# Patient Record
Sex: Female | Born: 1950 | Race: Black or African American | Hispanic: No | Smoking: Former smoker
Health system: Southern US, Community
[De-identification: ages and names within clinical notes are randomized; demographics above are authoritative.]

## PROBLEM LIST (undated history)

## (undated) DIAGNOSIS — D3500 Benign neoplasm of unspecified adrenal gland: Secondary | ICD-10-CM

## (undated) DIAGNOSIS — G4730 Sleep apnea, unspecified: Secondary | ICD-10-CM

## (undated) DIAGNOSIS — I10 Essential (primary) hypertension: Secondary | ICD-10-CM

## (undated) DIAGNOSIS — Z889 Allergy status to unspecified drugs, medicaments and biological substances status: Secondary | ICD-10-CM

## (undated) DIAGNOSIS — K219 Gastro-esophageal reflux disease without esophagitis: Secondary | ICD-10-CM

## (undated) DIAGNOSIS — R109 Unspecified abdominal pain: Secondary | ICD-10-CM

## (undated) DIAGNOSIS — D126 Benign neoplasm of colon, unspecified: Secondary | ICD-10-CM

## (undated) DIAGNOSIS — K449 Diaphragmatic hernia without obstruction or gangrene: Secondary | ICD-10-CM

## (undated) DIAGNOSIS — M1990 Unspecified osteoarthritis, unspecified site: Secondary | ICD-10-CM

## (undated) DIAGNOSIS — G8929 Other chronic pain: Secondary | ICD-10-CM

## (undated) HISTORY — PX: COLONOSCOPY: SHX174

## (undated) HISTORY — PX: ABDOMINAL HYSTERECTOMY: SHX81

## (undated) HISTORY — PX: CHOLECYSTECTOMY: SHX55

## (undated) HISTORY — PX: JOINT REPLACEMENT: SHX530

## (undated) HISTORY — PX: FRACTURE SURGERY: SHX138

---

## 1998-06-09 ENCOUNTER — Encounter: Admission: RE | Admit: 1998-06-09 | Discharge: 1998-06-09 | Payer: Self-pay | Admitting: *Deleted

## 2004-10-12 ENCOUNTER — Emergency Department: Payer: Self-pay | Admitting: Emergency Medicine

## 2004-12-03 ENCOUNTER — Ambulatory Visit: Payer: Self-pay | Admitting: Family Medicine

## 2004-12-14 ENCOUNTER — Ambulatory Visit: Payer: Self-pay | Admitting: Family Medicine

## 2006-01-23 ENCOUNTER — Emergency Department: Payer: Self-pay | Admitting: Emergency Medicine

## 2006-04-02 ENCOUNTER — Emergency Department: Payer: Self-pay | Admitting: Emergency Medicine

## 2006-07-06 ENCOUNTER — Ambulatory Visit: Payer: Self-pay | Admitting: Family Medicine

## 2007-01-12 ENCOUNTER — Ambulatory Visit: Payer: Self-pay | Admitting: Family Medicine

## 2007-02-21 ENCOUNTER — Emergency Department: Payer: Self-pay | Admitting: Emergency Medicine

## 2007-03-16 ENCOUNTER — Other Ambulatory Visit: Payer: Self-pay

## 2007-03-16 ENCOUNTER — Emergency Department: Payer: Self-pay | Admitting: General Practice

## 2007-03-24 ENCOUNTER — Ambulatory Visit: Payer: Self-pay | Admitting: Unknown Physician Specialty

## 2007-06-02 ENCOUNTER — Ambulatory Visit: Payer: Self-pay | Admitting: Unknown Physician Specialty

## 2007-09-08 ENCOUNTER — Ambulatory Visit: Payer: Self-pay | Admitting: Family Medicine

## 2007-09-14 ENCOUNTER — Ambulatory Visit: Payer: Self-pay | Admitting: Family Medicine

## 2007-09-19 ENCOUNTER — Ambulatory Visit: Payer: Self-pay | Admitting: Family Medicine

## 2008-02-08 ENCOUNTER — Ambulatory Visit: Payer: Self-pay | Admitting: Unknown Physician Specialty

## 2008-03-21 ENCOUNTER — Ambulatory Visit: Payer: Self-pay | Admitting: Unknown Physician Specialty

## 2008-06-19 ENCOUNTER — Ambulatory Visit: Payer: Self-pay | Admitting: Unknown Physician Specialty

## 2008-10-07 ENCOUNTER — Ambulatory Visit: Payer: Self-pay | Admitting: Unknown Physician Specialty

## 2009-04-16 ENCOUNTER — Ambulatory Visit: Payer: Self-pay | Admitting: Unknown Physician Specialty

## 2009-10-27 ENCOUNTER — Ambulatory Visit: Payer: Self-pay | Admitting: General Practice

## 2009-11-12 ENCOUNTER — Inpatient Hospital Stay: Payer: Self-pay | Admitting: General Practice

## 2010-05-19 ENCOUNTER — Ambulatory Visit: Payer: Self-pay | Admitting: Family Medicine

## 2011-06-08 ENCOUNTER — Ambulatory Visit: Payer: Self-pay | Admitting: Family Medicine

## 2012-06-08 ENCOUNTER — Ambulatory Visit: Payer: Self-pay | Admitting: Family Medicine

## 2013-05-18 ENCOUNTER — Ambulatory Visit: Payer: Self-pay | Admitting: Unknown Physician Specialty

## 2013-06-12 ENCOUNTER — Ambulatory Visit: Payer: Self-pay | Admitting: Family Medicine

## 2013-07-21 ENCOUNTER — Emergency Department: Payer: Self-pay | Admitting: Emergency Medicine

## 2013-07-21 LAB — COMPREHENSIVE METABOLIC PANEL
Albumin: 4 g/dL (ref 3.4–5.0)
Alkaline Phosphatase: 61 U/L (ref 50–136)
BUN: 17 mg/dL (ref 7–18)
Bilirubin,Total: 0.2 mg/dL (ref 0.2–1.0)
Chloride: 103 mmol/L (ref 98–107)
EGFR (African American): 45 — ABNORMAL LOW
EGFR (Non-African Amer.): 39 — ABNORMAL LOW
SGOT(AST): 20 U/L (ref 15–37)
SGPT (ALT): 25 U/L (ref 12–78)
Total Protein: 8 g/dL (ref 6.4–8.2)

## 2013-07-21 LAB — URINALYSIS, COMPLETE
Bacteria: NONE SEEN
Glucose,UR: NEGATIVE mg/dL (ref 0–75)
Leukocyte Esterase: NEGATIVE
Nitrite: NEGATIVE
Ph: 6 (ref 4.5–8.0)
Protein: NEGATIVE
Squamous Epithelial: 1

## 2013-07-21 LAB — CBC
HGB: 11.4 g/dL — ABNORMAL LOW (ref 12.0–16.0)
MCH: 24.6 pg — ABNORMAL LOW (ref 26.0–34.0)
Platelet: 342 10*3/uL (ref 150–440)
RDW: 15.6 % — ABNORMAL HIGH (ref 11.5–14.5)

## 2013-07-21 LAB — TROPONIN I: Troponin-I: <0.02 ng/mL

## 2013-08-06 ENCOUNTER — Emergency Department: Payer: Self-pay | Admitting: Emergency Medicine

## 2013-08-06 LAB — TROPONIN I: Troponin-I: <0.02 ng/mL

## 2013-08-06 LAB — COMPREHENSIVE METABOLIC PANEL
Albumin: 3.7 g/dL (ref 3.4–5.0)
Alkaline Phosphatase: 52 U/L (ref 50–136)
Anion Gap: 6 — ABNORMAL LOW (ref 7–16)
Calcium, Total: 9.5 mg/dL (ref 8.5–10.1)
Co2: 26 mmol/L (ref 21–32)
Creatinine: 0.96 mg/dL (ref 0.60–1.30)
EGFR (African American): >60
EGFR (Non-African Amer.): >60
Osmolality: 272 (ref 275–301)
Potassium: 3.1 mmol/L — ABNORMAL LOW (ref 3.5–5.1)

## 2013-08-06 LAB — CBC WITH DIFFERENTIAL/PLATELET
Basophil #: 0.1 10*3/uL (ref 0.0–0.1)
Basophil %: 0.9 %
Lymphocyte #: 2.6 10*3/uL (ref 1.0–3.6)
Lymphocyte %: 31.2 %
Monocyte #: 0.5 x10 3/mm (ref 0.2–0.9)
Monocyte %: 6.1 %
Neutrophil %: 60.8 %
Platelet: 307 10*3/uL (ref 150–440)
RDW: 15.6 % — ABNORMAL HIGH (ref 11.5–14.5)

## 2013-08-06 LAB — LIPASE, BLOOD: Lipase: 56 U/L — ABNORMAL LOW (ref 73–393)

## 2013-08-10 ENCOUNTER — Ambulatory Visit: Payer: Self-pay | Admitting: Unknown Physician Specialty

## 2013-11-06 ENCOUNTER — Ambulatory Visit: Payer: Self-pay | Admitting: Neurology

## 2013-12-11 ENCOUNTER — Ambulatory Visit: Payer: Self-pay | Admitting: Neurology

## 2014-03-20 ENCOUNTER — Ambulatory Visit: Payer: Self-pay

## 2014-05-08 ENCOUNTER — Emergency Department: Payer: Self-pay | Admitting: Emergency Medicine

## 2014-05-08 LAB — CBC WITH DIFFERENTIAL/PLATELET
Basophil #: 0.1 10*3/uL (ref 0.0–0.1)
Basophil %: 1.3 %
EOS ABS: 0.1 10*3/uL (ref 0.0–0.7)
Eosinophil %: 1.6 %
HCT: 35.5 % (ref 35.0–47.0)
HGB: 10.8 g/dL — AB (ref 12.0–16.0)
LYMPHS PCT: 37.4 %
Lymphocyte #: 3.4 10*3/uL (ref 1.0–3.6)
MCH: 23.2 pg — ABNORMAL LOW (ref 26.0–34.0)
MCHC: 30.5 g/dL — ABNORMAL LOW (ref 32.0–36.0)
MCV: 76 fL — ABNORMAL LOW (ref 80–100)
MONOS PCT: 5.9 %
Monocyte #: 0.5 x10 3/mm (ref 0.2–0.9)
NEUTROS ABS: 4.9 10*3/uL (ref 1.4–6.5)
Neutrophil %: 53.8 %
Platelet: 308 10*3/uL (ref 150–440)
RBC: 4.68 10*6/uL (ref 3.80–5.20)
RDW: 16.3 % — ABNORMAL HIGH (ref 11.5–14.5)
WBC: 9.1 10*3/uL (ref 3.6–11.0)

## 2014-05-08 LAB — COMPREHENSIVE METABOLIC PANEL
Albumin: 3.7 g/dL (ref 3.4–5.0)
Alkaline Phosphatase: 57 U/L
Anion Gap: 9 (ref 7–16)
BUN: 16 mg/dL (ref 7–18)
Bilirubin,Total: 0.1 mg/dL — ABNORMAL LOW (ref 0.2–1.0)
Calcium, Total: 8.8 mg/dL (ref 8.5–10.1)
Chloride: 106 mmol/L (ref 98–107)
Co2: 23 mmol/L (ref 21–32)
Creatinine: 1.25 mg/dL (ref 0.60–1.30)
EGFR (African American): 54 — ABNORMAL LOW
EGFR (Non-African Amer.): 46 — ABNORMAL LOW
Glucose: 97 mg/dL (ref 65–99)
Osmolality: 277 (ref 275–301)
Potassium: 3.8 mmol/L (ref 3.5–5.1)
SGOT(AST): 12 U/L — ABNORMAL LOW (ref 15–37)
SGPT (ALT): 20 U/L
Sodium: 138 mmol/L (ref 136–145)
TOTAL PROTEIN: 7.6 g/dL (ref 6.4–8.2)

## 2014-05-08 LAB — LIPASE, BLOOD: LIPASE: 81 U/L (ref 73–393)

## 2014-05-08 LAB — URINALYSIS, COMPLETE
BACTERIA: NONE SEEN
Bilirubin,UR: NEGATIVE
Blood: NEGATIVE
GLUCOSE, UR: NEGATIVE mg/dL (ref 0–75)
Ketone: NEGATIVE
LEUKOCYTE ESTERASE: NEGATIVE
Nitrite: NEGATIVE
Ph: 5 (ref 4.5–8.0)
Protein: NEGATIVE
RBC,UR: NONE SEEN /HPF (ref 0–5)
Specific Gravity: 1.01 (ref 1.003–1.030)
Squamous Epithelial: 1
WBC UR: NONE SEEN /HPF (ref 0–5)

## 2014-05-08 LAB — TROPONIN I: Troponin-I: <0.02 ng/mL

## 2014-08-12 ENCOUNTER — Ambulatory Visit: Payer: Self-pay | Admitting: Family Medicine

## 2015-10-09 ENCOUNTER — Other Ambulatory Visit: Payer: Self-pay | Admitting: Family Medicine

## 2015-10-09 DIAGNOSIS — Z1231 Encounter for screening mammogram for malignant neoplasm of breast: Secondary | ICD-10-CM

## 2015-10-24 ENCOUNTER — Ambulatory Visit
Admission: RE | Admit: 2015-10-24 | Discharge: 2015-10-24 | Disposition: A | Discharge status: Home / Self Care | Payer: Medicare Other | Source: Ambulatory Visit | Attending: Family Medicine | Admitting: Family Medicine

## 2015-10-24 DIAGNOSIS — Z1231 Encounter for screening mammogram for malignant neoplasm of breast: Secondary | ICD-10-CM | POA: Diagnosis present

## 2015-11-27 ENCOUNTER — Other Ambulatory Visit: Payer: Self-pay | Admitting: Adult Health

## 2015-11-27 DIAGNOSIS — R7989 Other specified abnormal findings of blood chemistry: Secondary | ICD-10-CM

## 2015-11-27 DIAGNOSIS — IMO0002 Reserved for concepts with insufficient information to code with codable children: Secondary | ICD-10-CM

## 2015-11-28 ENCOUNTER — Ambulatory Visit: Payer: Medicare Other

## 2015-12-01 ENCOUNTER — Ambulatory Visit
Admission: RE | Admit: 2015-12-01 | Discharge: 2015-12-01 | Disposition: A | Discharge status: Home / Self Care | Payer: Medicare Other | Source: Ambulatory Visit | Attending: Adult Health | Admitting: Adult Health

## 2015-12-01 DIAGNOSIS — R7989 Other specified abnormal findings of blood chemistry: Secondary | ICD-10-CM | POA: Insufficient documentation

## 2016-08-08 ENCOUNTER — Emergency Department
Admission: EM | Admit: 2016-08-08 | Discharge: 2016-08-08 | Disposition: A | Discharge status: Home / Self Care | Payer: Medicare Other | Attending: Emergency Medicine | Admitting: Emergency Medicine

## 2016-08-08 ENCOUNTER — Encounter: Payer: Self-pay | Admitting: Emergency Medicine

## 2016-08-08 ENCOUNTER — Emergency Department: Payer: Medicare Other

## 2016-08-08 DIAGNOSIS — M5442 Lumbago with sciatica, left side: Secondary | ICD-10-CM | POA: Insufficient documentation

## 2016-08-08 DIAGNOSIS — R42 Dizziness and giddiness: Secondary | ICD-10-CM | POA: Insufficient documentation

## 2016-08-08 DIAGNOSIS — M5432 Sciatica, left side: Secondary | ICD-10-CM

## 2016-08-08 DIAGNOSIS — I10 Essential (primary) hypertension: Secondary | ICD-10-CM | POA: Diagnosis not present

## 2016-08-08 DIAGNOSIS — M545 Low back pain: Secondary | ICD-10-CM | POA: Diagnosis present

## 2016-08-08 HISTORY — DX: Gastro-esophageal reflux disease without esophagitis: K21.9

## 2016-08-08 HISTORY — DX: Essential (primary) hypertension: I10

## 2016-08-08 HISTORY — DX: Diaphragmatic hernia without obstruction or gangrene: K44.9

## 2016-08-08 LAB — URINALYSIS COMPLETE WITH MICROSCOPIC (ARMC ONLY)
BACTERIA UA: NONE SEEN
Bilirubin Urine: NEGATIVE
Glucose, UA: NEGATIVE mg/dL
Hgb urine dipstick: NEGATIVE
KETONES UR: NEGATIVE mg/dL
Leukocytes, UA: NEGATIVE
NITRITE: NEGATIVE
PH: 7 (ref 5.0–8.0)
PROTEIN: NEGATIVE mg/dL
RBC / HPF: NONE SEEN RBC/hpf (ref 0–5)
SPECIFIC GRAVITY, URINE: 1.011 (ref 1.005–1.030)

## 2016-08-08 LAB — BASIC METABOLIC PANEL
ANION GAP: 10 (ref 5–15)
BUN: 17 mg/dL (ref 6–20)
CALCIUM: 9.6 mg/dL (ref 8.9–10.3)
CO2: 25 mmol/L (ref 22–32)
Chloride: 104 mmol/L (ref 101–111)
Creatinine, Ser: 1.09 mg/dL — ABNORMAL HIGH (ref 0.44–1.00)
GFR calc non Af Amer: 53 mL/min — ABNORMAL LOW (ref >60)
GLUCOSE: 118 mg/dL — AB (ref 65–99)
POTASSIUM: 3.2 mmol/L — AB (ref 3.5–5.1)
Sodium: 139 mmol/L (ref 135–145)

## 2016-08-08 LAB — CBC
HEMATOCRIT: 29.1 % — AB (ref 35.0–47.0)
HEMOGLOBIN: 9.2 g/dL — AB (ref 12.0–16.0)
MCH: 20.6 pg — AB (ref 26.0–34.0)
MCHC: 31.5 g/dL — AB (ref 32.0–36.0)
MCV: 65.5 fL — ABNORMAL LOW (ref 80.0–100.0)
Platelets: 406 10*3/uL (ref 150–440)
RBC: 4.44 MIL/uL (ref 3.80–5.20)
RDW: 18.8 % — ABNORMAL HIGH (ref 11.5–14.5)
WBC: 9.8 10*3/uL (ref 3.6–11.0)

## 2016-08-08 NOTE — ED Notes (Signed)
Lying HR 61, BP 120/67. Sitting HR 65, BP 140/70 Standing HR 65, BP 134/67

## 2016-08-08 NOTE — ED Provider Notes (Signed)
[facility] Emergency Department Provider Note  ____________________________________________   I have reviewed the triage vital signs and the nursing notes.   HISTORY  Chief Complaint Weakness and Dizziness    HPI Teresa Sparks is a 65 y.o. female here with multiple different symptoms. The first is that she has a history of sciatica for "years". Known back problems, states that she has chronic back pain. She has no change in her back pain, that she does have occasional sciatic symptoms which in call shooting pains down her left leg, and tingling in her left leg. Yesterday and today she had tingling in her left leg only. She denies any focal weakness. Tingling got better soon as she sat down. Lasted just as long as he would last a few foot was asleep. It happened again this morning. She has had similar symptoms with this in the past. Patient denies any neurologic deficit. She does not wish a CT scan. Patient states at this time she has a tiny area of tingling still in the left anterior thigh which continues on despite the resolution of the rest of the symptoms. Nonetheless everything else is back to normal. She denies any difficulty speaking or talking she denies any chest pain shortness of breath she denies any focal weakness. She denies incontinence of bowel or bladder she denies any recent fall or trauma. Patient also states that they have increased her blood pressure, her blood pressure normally runs in the 180s to 190s, here, today, it is been lower. She states that sometimes when she stands up she feels lightheaded. His only when she stands up. She does not have any other symptoms of that variety. Patient states that she does not have a headache or change in vision.     Past Medical History:  Diagnosis Date  . GERD (gastroesophageal reflux disease)   . Hiatal hernia   . Hypertension     There are no active problems to display for this patient.   Past  Surgical History:  Procedure Laterality Date  . ABDOMINAL HYSTERECTOMY    . CHOLECYSTECTOMY    . JOINT REPLACEMENT      Prior to Admission medications   Not on File    Allergies Ivp dye [iodinated diagnostic agents] and Sulfa antibiotics  No family history on file.  Social History Social History  Substance Use Topics  . Smoking status: Never Smoker  . Smokeless tobacco: Never Used  . Alcohol use No    Review of Systems }Constitutional: No fever/chills Eyes: No visual changes. ENT: No sore throat. No stiff neck no neck pain Cardiovascular: Denies chest pain. Respiratory: Denies shortness of breath. Gastrointestinal:   no vomiting.  No diarrhea.  No constipation. Genitourinary: Negative for dysuria. Musculoskeletal: Negative lower extremity swelling Skin: Negative for rash. Neurological: Negative for severe headaches, focal weakness or  10-point ROS otherwise negative.  ____________________________________________   PHYSICAL EXAM:  VITAL SIGNS: ED Triage Vitals  Enc Vitals Group     BP 08/08/16 0928 (!) 160/60     Pulse Rate 08/08/16 0928 77     Resp 08/08/16 0928 15     Temp 08/08/16 0928 98.1 F (36.7 C)     Temp Source 08/08/16 0928 Oral     SpO2 08/08/16 0928 100 %     Weight 08/08/16 0929 238 lb (108 kg)     Height 08/08/16 0929 5\' 9"  (1.753 m)     Head Circumference --      Peak Flow --  Pain Score 08/08/16 0928 10     Pain Loc --      Pain Edu? --      Excl. in [place]? --     Constitutional: Alert and oriented. Well appearing and in no acute distress. Eyes: Conjunctivae are normal. PERRL. EOMI. Head: Atraumatic. Nose: No congestion/rhinnorhea. Mouth/Throat: Mucous membranes are moist.  Oropharynx non-erythematous. Neck: No stridor.   Nontender with no meningismus Cardiovascular: Normal rate, regular rhythm. Grossly normal heart sounds.  Good peripheral circulation. Respiratory: Normal respiratory effort.  No retractions. Lungs  CTAB. Abdominal: Soft and nontender. No distention. No guarding no rebound Back:  A with low back tenderness to palpation in the upper buttock which reproduces her back pain and also since a tingling sensation down her leg. There is no shingles lesions, no midline tenderness.  there is no midline tenderness there are no lesions noted. there is no CVA tenderness Musculoskeletal: No lower extremity tenderness, no upper extremity tenderness. No joint effusions, no DVT signs strong distal pulses no edema Neurologic:  NCranial nerves II through XII are grossly intact 5 out of 5 strength bilateral upper and lower extremity. Finger to nose within normal limits heel to shin within normal limits, speech is normal with no word finding difficulty or dysarthria, reflexes symmetric, pupils are equally round and reactive to light, there is no pronator drift, sensation is normal, vision is intact to confrontation, gait is deferred, there is no nystagmus, normal neurologic exam, patient does have a positive straight leg raise on the left by lift her leg up it causes her to have pain shoot down her leg. Skin:  Skin is warm, dry and intact. No rash noted. Psychiatric: Mood and affect are normal. Speech and behavior are normal.  ____________________________________________   LABS (all labs ordered are listed, but only abnormal results are displayed)  Labs Reviewed  BASIC METABOLIC PANEL - Abnormal; Notable for the following:       Result Value   Potassium 3.2 (*)    Glucose, Bld 118 (*)    Creatinine, Ser 1.09 (*)    GFR calc non Af Amer 53 (*)    All other components within normal limits  CBC - Abnormal; Notable for the following:    Hemoglobin 9.2 (*)    HCT 29.1 (*)    MCV 65.5 (*)    MCH 20.6 (*)    MCHC 31.5 (*)    RDW 18.8 (*)    All other components within normal limits  URINALYSIS COMPLETEWITH MICROSCOPIC ([facility] ONLY)   ____________________________________________  EKG  I personally  interpreted any EKGs ordered by me or triage Normal sinus rhythm at 67 beats minute no acute ST elevation or acute ST depression normal EKG sent from nonspecific ST flattening ____________________________________________  RADIOLOGY  I reviewed any imaging ordered by me or triage that were performed during my shift and, if possible, patient and/or family made aware of any abnormal findings. ____________________________________________   PROCEDURES  Procedure(s) performed: None  Procedures  Critical Care performed: None  ____________________________________________   INITIAL IMPRESSION / ASSESSMENT AND PLAN / ED COURSE  Pertinent labs & imaging results that were available during my care of the patient were reviewed by me and considered in my medical decision making (see chart for details).  Patient has 2 different symptoms here today.  The first is what appears to be to be a sciatic. It is reproducible, is elicitable straight leg raise nonetheless she is neurologic intact there is no evidence of cauda  equina syndrome, and she has had this multiple times before. We can certainly treat this with pain medication. The second is that after a new blood pressure medication she feels somewhat lightheaded when she stands up too quickly. She's been told that she must start to stand up slowly and she is working on that. I did offer a CT scan because of the focality of her symptoms even vanishingly small suspicion of CVA and she would prefer not to have one. I do not think this is unreasonable. We have checked orthostatics here. She has a reassuring profile. I don't think she is profoundly orthostatic. Blood work shows no evidence of anemia. We are waiting for a urinalysis is because of her sensation of lightheadedness, she has no focal numbness on exam. At this time the only place she complains of any symptoms when I'm not actually lifting her leg up is in the lateral thigh. I think this represents  most likely a sciatic pathology. Meralgia paresthetica is certainly also possible. However because she has had tingling shooting down her entire leg at think that is not as reasonable a supposition. In any event, patient looks quite well and it is my hope that we can safely get her home with close outpatient follow-up. Extensive return precautions for any deterioration neurologically otherwise given and understood.  Clinical Course    ____________________________________________   FINAL CLINICAL IMPRESSION(S) / ED DIAGNOSES  Final diagnoses:  None      This chart was dictated using voice recognition software.  Despite best efforts to proofread,  errors can occur which can change meaning.      Schuyler Amor, MD 08/08/16 [phone]

## 2016-08-08 NOTE — ED Notes (Signed)
Pt reports intermittent left lower leg "tinlging, like it's asleep" since last night. Pt recently started on BP medication X 3 per day and feels like decrease in BP is related to sx. Pt alert and oriented X4, active, cooperative, pt in NAD. RR even and unlabored, color WNL.  Speech clear.

## 2016-08-08 NOTE — Discharge Instructions (Signed)
It is likely her fairly somewhat lightheaded because of your blood pressure medications, however, we are reassured by your orthostatic findings. We advised to be very careful about getting up in a hurry on these new medications. However U blood pressure is much better controlled and is likely for the best that he continue them. Return to the emergency room if you feel worse in any way. We also notice that he do have what is apparently sciatica again, we will send her home with pain medication for this with [place] if follow closely with her primary care doctor. Return to the emergency room if you have any weakness or other complaints especially if you have any trouble with bowel or bladder.

## 2016-08-08 NOTE — ED Triage Notes (Signed)
Patient presents to the ED with intermittent tingling in left leg that is worse when patient attempts to stand and dizziness with standing.  Patient was started on hydralazine 3x day starting on Monday.  Patient reports several days of very high blood pressure but it is trending down.  Patient states, "Maybe it's dropping my blood pressure too fast because my doctor said we don't know how long it's been this high."  Patient denies confusion or difficulty speaking.  Denies headache.  Patient is complaining of right sided neck pain.  Patient states left leg tingling first started yesterday but has been coming and going.

## 2016-08-08 NOTE — ED Notes (Signed)
Patient transported to radiology

## 2016-08-08 NOTE — ED Notes (Signed)
ED Provider at bedside. 

## 2016-10-12 ENCOUNTER — Other Ambulatory Visit: Payer: Self-pay | Admitting: Family Medicine

## 2016-10-12 DIAGNOSIS — Z1231 Encounter for screening mammogram for malignant neoplasm of breast: Secondary | ICD-10-CM

## 2016-11-05 ENCOUNTER — Ambulatory Visit: Payer: Medicare Other

## 2016-11-09 ENCOUNTER — Ambulatory Visit
Admission: RE | Admit: 2016-11-09 | Discharge: 2016-11-09 | Disposition: A | Discharge status: Home / Self Care | Payer: Medicare Other | Source: Ambulatory Visit | Attending: Family Medicine | Admitting: Family Medicine

## 2016-11-09 DIAGNOSIS — Z1231 Encounter for screening mammogram for malignant neoplasm of breast: Secondary | ICD-10-CM | POA: Diagnosis present

## 2017-01-12 ENCOUNTER — Other Ambulatory Visit: Payer: Self-pay | Admitting: Orthopedic Surgery

## 2017-01-12 DIAGNOSIS — M48061 Spinal stenosis, lumbar region without neurogenic claudication: Secondary | ICD-10-CM

## 2017-01-21 ENCOUNTER — Ambulatory Visit
Admission: RE | Admit: 2017-01-21 | Discharge: 2017-01-21 | Disposition: A | Discharge status: Home / Self Care | Payer: Medicare Other | Source: Ambulatory Visit | Attending: Orthopedic Surgery | Admitting: Orthopedic Surgery

## 2017-01-21 DIAGNOSIS — M5126 Other intervertebral disc displacement, lumbar region: Secondary | ICD-10-CM | POA: Diagnosis not present

## 2017-01-21 DIAGNOSIS — M48061 Spinal stenosis, lumbar region without neurogenic claudication: Secondary | ICD-10-CM | POA: Diagnosis present

## 2017-01-21 DIAGNOSIS — M5136 Other intervertebral disc degeneration, lumbar region: Secondary | ICD-10-CM | POA: Diagnosis not present

## 2017-10-03 ENCOUNTER — Other Ambulatory Visit: Payer: Self-pay | Admitting: Student

## 2017-10-03 ENCOUNTER — Other Ambulatory Visit: Payer: Self-pay | Admitting: Family Medicine

## 2017-10-03 DIAGNOSIS — Z1231 Encounter for screening mammogram for malignant neoplasm of breast: Secondary | ICD-10-CM

## 2017-11-10 ENCOUNTER — Ambulatory Visit
Admission: RE | Admit: 2017-11-10 | Discharge: 2017-11-10 | Disposition: A | Discharge status: Home / Self Care | Payer: Medicare Other | Source: Ambulatory Visit | Attending: Student | Admitting: Student

## 2017-11-10 DIAGNOSIS — Z1231 Encounter for screening mammogram for malignant neoplasm of breast: Secondary | ICD-10-CM | POA: Diagnosis not present

## 2018-04-23 ENCOUNTER — Emergency Department
Admission: EM | Admit: 2018-04-23 | Discharge: 2018-04-23 | Disposition: A | Discharge status: Home / Self Care | Payer: Medicare Other | Attending: Emergency Medicine | Admitting: Emergency Medicine

## 2018-04-23 ENCOUNTER — Encounter: Payer: Self-pay | Admitting: Emergency Medicine

## 2018-04-23 DIAGNOSIS — I10 Essential (primary) hypertension: Secondary | ICD-10-CM | POA: Diagnosis not present

## 2018-04-23 DIAGNOSIS — M545 Low back pain: Secondary | ICD-10-CM | POA: Diagnosis present

## 2018-04-23 DIAGNOSIS — M62838 Other muscle spasm: Secondary | ICD-10-CM | POA: Insufficient documentation

## 2018-04-23 DIAGNOSIS — M62830 Muscle spasm of back: Secondary | ICD-10-CM

## 2018-04-23 LAB — URINALYSIS, COMPLETE (UACMP) WITH MICROSCOPIC
Bilirubin Urine: NEGATIVE
GLUCOSE, UA: NEGATIVE mg/dL
Hgb urine dipstick: NEGATIVE
Ketones, ur: NEGATIVE mg/dL
Leukocytes, UA: NEGATIVE
Nitrite: NEGATIVE
PROTEIN: NEGATIVE mg/dL
Specific Gravity, Urine: 1.015 (ref 1.005–1.030)
pH: 7 (ref 5.0–8.0)

## 2018-04-23 LAB — CBC
HCT: 31.2 % — ABNORMAL LOW (ref 35.0–47.0)
Hemoglobin: 10.1 g/dL — ABNORMAL LOW (ref 12.0–16.0)
MCH: 22.4 pg — AB (ref 26.0–34.0)
MCHC: 32.3 g/dL (ref 32.0–36.0)
MCV: 69.5 fL — AB (ref 80.0–100.0)
PLATELETS: 418 10*3/uL (ref 150–440)
RBC: 4.49 MIL/uL (ref 3.80–5.20)
RDW: 18.9 % — ABNORMAL HIGH (ref 11.5–14.5)
WBC: 8 10*3/uL (ref 3.6–11.0)

## 2018-04-23 LAB — BASIC METABOLIC PANEL
ANION GAP: 10 (ref 5–15)
BUN: 16 mg/dL (ref 8–23)
CALCIUM: 9.6 mg/dL (ref 8.9–10.3)
CO2: 25 mmol/L (ref 22–32)
CREATININE: 1.16 mg/dL — AB (ref 0.44–1.00)
Chloride: 103 mmol/L (ref 98–111)
GFR calc non Af Amer: 48 mL/min — ABNORMAL LOW (ref >60)
GFR, EST AFRICAN AMERICAN: 56 mL/min — AB (ref >60)
GLUCOSE: 104 mg/dL — AB (ref 70–99)
Potassium: 3.6 mmol/L (ref 3.5–5.1)
SODIUM: 138 mmol/L (ref 135–145)

## 2018-04-23 MED ORDER — SODIUM CHLORIDE 0.9 % IV BOLUS
500.0000 mL | Freq: Once | INTRAVENOUS | Status: AC
  Administered 2018-04-23: 500 mL via INTRAVENOUS

## 2018-04-23 MED ORDER — DIAZEPAM 2 MG PO TABS: 2.0000 mg | ORAL_TABLET | Freq: Three times a day (TID) | ORAL | 0 refills | Status: AC | PRN

## 2018-04-23 MED ORDER — DIAZEPAM 2 MG PO TABS
2.0000 mg | ORAL_TABLET | ORAL | Status: AC
  Administered 2018-04-23: 2 mg via ORAL
  Filled 2018-04-23: qty 1

## 2018-04-23 MED ORDER — KETOROLAC TROMETHAMINE 30 MG/ML IJ SOLN
15.0000 mg | Freq: Once | INTRAMUSCULAR | Status: AC
  Administered 2018-04-23: 15 mg via INTRAVENOUS
  Filled 2018-04-23: qty 1

## 2018-04-23 NOTE — ED Triage Notes (Signed)
Patient presents to the ED with back pain and numbness in her left hand and left leg x 1 month.  Patient also is reporting a headache that began Friday and she is now having dizziness and right ear numbness.  Patient states, "It felt like I wanted to pass out but I would sit down and I wouldn't".

## 2018-04-23 NOTE — ED Notes (Signed)
Rounded on pt. She is tearful denies needs.

## 2018-04-23 NOTE — ED Provider Notes (Signed)
[facility] Emergency Department Provider Note   ____________________________________________   First MD Initiated Contact with Patient 04/23/18 [phone]     (approximate)  I have reviewed the triage vital signs and the nursing notes.   HISTORY  Chief Complaint Back Pain and Dizziness    HPI Teresa Sparks is a 67 y.o. female for evaluation of back pain  Patient reports that for several months now she been experiencing if not 2 years she reports pain and discomfort in her left lower back that radiates into her left lower leg.  She also reports that she has seen her doctor for this, saw her primary on Monday and they prescribed her Cymbalta for which she took 1 tablet but reports that made her feel funny so she discontinued.  She continues to have a feeling of discomfort and occasional tingling in her left leg but reports that the muscles in the back from her lower back all the way up in her neck just feel tight and uncomfortable.  She reports she feels like she is having "muscle spasms".  When she gets this way she will start feeling lightheaded, reports she does not feel too well sometimes it feels like the muscles in the back of her neck are tight and cause a bit of a headache that she had on Friday that is now gone away  She reports she thinks if she could get some pain medicine to just bring the pain and spasm in the muscles down she feels better.  No change in bowel or bladder habits.  No incontinence.  No change in the slight tingling she gets over the back of the buttock in the left upper leg.  No new weakness.  She reports she had an MRI, seen back specialist, Dr. Rudene Christians for the same.  She reports about a year or so ago she came to the ER with similar symptoms got better with pain medication.    Past Medical History:  Diagnosis Date  . GERD (gastroesophageal reflux disease)   . Hiatal hernia   . Hypertension     There are no active problems to  display for this patient.   Past Surgical History:  Procedure Laterality Date  . ABDOMINAL HYSTERECTOMY    . CHOLECYSTECTOMY    . JOINT REPLACEMENT      Prior to Admission medications   Medication Sig Start Date End Date Taking? Authorizing Provider  diazepam (VALIUM) 2 MG tablet Take 1 tablet (2 mg total) by mouth every 8 (eight) hours as needed for muscle spasms. 04/23/18 04/23/19  Delman Kitten, MD    Allergies Ivp dye [iodinated diagnostic agents] and Sulfa antibiotics  Family History  Problem Relation Age of Onset  . Breast cancer Neg Hx     Social History Social History   Tobacco Use  . Smoking status: Never Smoker  . Smokeless tobacco: Never Used  Substance Use Topics  . Alcohol use: No  . Drug use: Not on file    Review of Systems Constitutional: No fever/chills Eyes: No visual changes. ENT: No sore throat. Cardiovascular: Denies chest pain. Respiratory: Denies shortness of breath. Gastrointestinal: No abdominal pain.  No nausea, no vomiting.  No diarrhea.  No constipation. Genitourinary: Negative for dysuria. Musculoskeletal: See HPI.  No falls or injuries. Skin: Negative for rash. Neurological: Negative for headaches, focal weakness or numbness for some tingling in the left buttock and left back of her thigh which is been present for at least a couple  of months without change.    ____________________________________________   PHYSICAL EXAM:  VITAL SIGNS: ED Triage Vitals  Enc Vitals Group     BP 04/23/18 0847 (!) 130/116     Pulse Rate 04/23/18 0847 76     Resp 04/23/18 0847 18     Temp 04/23/18 0847 98.9 F (37.2 C)     Temp Source 04/23/18 0847 Oral     SpO2 04/23/18 0847 100 %     Weight 04/23/18 0850 240 lb (108.9 kg)     Height 04/23/18 0850 5\' 9"  (1.753 m)     Head Circumference --      Peak Flow --      Pain Score 04/23/18 0848 9     Pain Loc --      Pain Edu? --      Excl. in [place]? --     Constitutional: Alert and oriented. Well  appearing and in no acute distress.  She and her husband both very pleasant. Eyes: Conjunctivae are normal. Head: Atraumatic. Nose: No congestion/rhinnorhea. Mouth/Throat: Mucous membranes are moist. Neck: No stridor.   Cardiovascular: Normal rate, regular rhythm. Grossly normal heart sounds.  Good peripheral circulation. Respiratory: Normal respiratory effort.  No retractions. Lungs CTAB. Gastrointestinal: Soft and nontender. No distention. Musculoskeletal: No lower extremity tenderness nor edema.  She does report tenderness primarily to palpation in the left paraspinous region in the lumbar and thoracic region.  She does report some tenderness over the sciatic region and approximately L4 region involving the left lower back.  She reports pain there to palpation shoots discomfort in the left thigh.  She is able to use her extremities well.  She has 5 out of 5 strength in major muscle groups involving both lower extremities.  She has no objective decrease in sensation over the foot, outer or inner calves or thighs.  She is able to detect light touch in both regions equally bilateral. Neurologic:  Normal speech and language. No gross focal neurologic deficits are appreciated.  Skin:  Skin is warm, dry and intact. No rash noted. Psychiatric: Mood and affect are normal. Speech and behavior are normal.  ____________________________________________   LABS (all labs ordered are listed, but only abnormal results are displayed)  Labs Reviewed  BASIC METABOLIC PANEL - Abnormal; Notable for the following components:      Result Value   Glucose, Bld 104 (*)    Creatinine, Ser 1.16 (*)    GFR calc non Af Amer 48 (*)    GFR calc Af Amer 56 (*)    All other components within normal limits  CBC - Abnormal; Notable for the following components:   Hemoglobin 10.1 (*)    HCT 31.2 (*)    MCV 69.5 (*)    MCH 22.4 (*)    RDW 18.9 (*)    All other components within normal limits  URINALYSIS, COMPLETE  (UACMP) WITH MICROSCOPIC - Abnormal; Notable for the following components:   Color, Urine YELLOW (*)    APPearance CLEAR (*)    Bacteria, UA RARE (*)    All other components within normal limits   ____________________________________________  EKG  ED ECG REPORT I, Delman Kitten, the attending physician, personally viewed and interpreted this ECG.  Date: 04/23/2018 EKG Time: 9 AM Rate: 70 Rhythm: normal sinus rhythm QRS Axis: normal Intervals: normal ST/T Wave abnormalities: normal Narrative Interpretation: no evidence of acute ischemia  ____________________________________________  RADIOLOGY  No red flags for emergent imaging denoted.  Appears chronic  in nature, no signs or symptoms of notable progressing except for now having apparent muscle spasm involving the paraspinous manges suspiciously in the lumbar region radiating discomfort and pain of her back.  No incontinence.  No dense sensory loss or acute motor findings.  No bowel or bladder changes.  Previous MRI imaging from April reviewed. ____________________________________________   PROCEDURES  Procedure(s) performed: None  Procedures  Critical Care performed: No  ____________________________________________   INITIAL IMPRESSION / ASSESSMENT AND PLAN / ED COURSE  Pertinent labs & imaging results that were available during my care of the patient were reviewed by me and considered in my medical decision making (see chart for details).  Pain and discomfort with associated feeling of lightheadedness.  Appears to be related to her back pain.  Suspect muscle spasms causing discomfort throughout her back.  There is well-documented in the patient reports this is chronic with worsening involving primarily the paraspinous regions.  Will trial a small dose of Toradol, also some Valium which she reports she is taken previously.  She does see physical medicine specialist as well as her primary doctor.  She is discontinued her  Cymbalta, and we discussed using a steroid burst but she reports she thinks that prednisone in the past that caused a stomach upset or nausea and vomiting when she had taken steroids before we will thus avoided.  She denies any acute other associated symptoms such as chest pain, no focal neurologic symptoms.  Reports her headache is better now, does not appear to be a sudden or severe headache and noticed with any neurologic changes.  Her EKG is normal no cardiac symptoms.  No pulmonary symptoms.  Does report lightheadedness, but reports he thinks it is from the pain and has not experienced any syncopal-like symptoms.      ----------------------------------------- 1:25 PM on 04/23/2018 -----------------------------------------  Patient reports she is feeling much better.  Not feeling lightheaded anymore.  Pain is better, she feels like she would like to be able to go home.  She is awake and alert, appears appropriate for discharge.  Discussed with her not to drive today or drive within 8 hours of taking diazepam.  Will give a short prescription for diazepam, and she will follow-up closely Dr. Kary Kos as well as Dr. Rudene Christians and her physical medicine physician.  Return precautions and treatment recommendations and follow-up discussed with the patient who is agreeable with the plan.   ____________________________________________   FINAL CLINICAL IMPRESSION(S) / ED DIAGNOSES  Final diagnoses:  Paraspinal muscle spasm      NEW MEDICATIONS STARTED DURING THIS VISIT:  New Prescriptions   DIAZEPAM (VALIUM) 2 MG TABLET    Take 1 tablet (2 mg total) by mouth every 8 (eight) hours as needed for muscle spasms.     Note:  This document was prepared using Dragon voice recognition software and may include unintentional dictation errors.     Delman Kitten, MD 04/23/18 1325

## 2018-04-23 NOTE — ED Triage Notes (Signed)
First Nurse Note:  Arrives with C/O back pain and ear ache since Monday.  States seen by PCP last week and given muscle relaxers, which made her feel worse.  AAOx3.  Skin warm and dry.  Ambulates with easy and steady gait.  NAD.    Wheelchair offered, declined.

## 2018-04-23 NOTE — Discharge Instructions (Signed)
Please follow up with your doctor as soon as possible regarding today's ED visit and your back pain.  Return to the ED for worsening back pain, fever, weakness or numbness of either leg, or if you develop either (1) an inability to urinate or have bowel movements, or (2) loss of your ability to control your bathroom functions (if you start having "accidents"), or if you develop other new symptoms that concern you.  No driving today or within 8 hours of taking diazepam.

## 2018-07-21 ENCOUNTER — Encounter: Payer: Self-pay | Admitting: *Deleted

## 2018-07-24 ENCOUNTER — Ambulatory Visit
Admission: RE | Admit: 2018-07-24 | Discharge: 2018-07-24 | Disposition: A | Discharge status: Home / Self Care | Payer: Medicare Other | Source: Ambulatory Visit | Attending: Unknown Physician Specialty | Admitting: Unknown Physician Specialty

## 2018-07-24 ENCOUNTER — Encounter
Admission: RE | Disposition: A | Discharge status: Home / Self Care | Payer: Self-pay | Source: Ambulatory Visit | Attending: Unknown Physician Specialty

## 2018-07-24 ENCOUNTER — Ambulatory Visit: Payer: Medicare Other | Admitting: Anesthesiology

## 2018-07-24 ENCOUNTER — Encounter: Payer: Self-pay | Admitting: *Deleted

## 2018-07-24 DIAGNOSIS — Z87891 Personal history of nicotine dependence: Secondary | ICD-10-CM | POA: Insufficient documentation

## 2018-07-24 DIAGNOSIS — I10 Essential (primary) hypertension: Secondary | ICD-10-CM | POA: Insufficient documentation

## 2018-07-24 DIAGNOSIS — Z79899 Other long term (current) drug therapy: Secondary | ICD-10-CM | POA: Insufficient documentation

## 2018-07-24 DIAGNOSIS — K640 First degree hemorrhoids: Secondary | ICD-10-CM | POA: Diagnosis not present

## 2018-07-24 DIAGNOSIS — Z7982 Long term (current) use of aspirin: Secondary | ICD-10-CM | POA: Diagnosis not present

## 2018-07-24 DIAGNOSIS — Z8371 Family history of colonic polyps: Secondary | ICD-10-CM | POA: Insufficient documentation

## 2018-07-24 DIAGNOSIS — Z1211 Encounter for screening for malignant neoplasm of colon: Secondary | ICD-10-CM | POA: Insufficient documentation

## 2018-07-24 DIAGNOSIS — D122 Benign neoplasm of ascending colon: Secondary | ICD-10-CM | POA: Insufficient documentation

## 2018-07-24 DIAGNOSIS — K219 Gastro-esophageal reflux disease without esophagitis: Secondary | ICD-10-CM | POA: Diagnosis not present

## 2018-07-24 DIAGNOSIS — K449 Diaphragmatic hernia without obstruction or gangrene: Secondary | ICD-10-CM | POA: Diagnosis not present

## 2018-07-24 DIAGNOSIS — M1990 Unspecified osteoarthritis, unspecified site: Secondary | ICD-10-CM | POA: Insufficient documentation

## 2018-07-24 HISTORY — DX: Unspecified osteoarthritis, unspecified site: M19.90

## 2018-07-24 HISTORY — DX: Unspecified abdominal pain: R10.9

## 2018-07-24 HISTORY — DX: Benign neoplasm of colon, unspecified: D12.6

## 2018-07-24 HISTORY — DX: Allergy status to unspecified drugs, medicaments and biological substances: Z88.9

## 2018-07-24 HISTORY — DX: Other chronic pain: G89.29

## 2018-07-24 HISTORY — DX: Benign neoplasm of unspecified adrenal gland: D35.00

## 2018-07-24 HISTORY — PX: COLONOSCOPY WITH PROPOFOL: SHX5780

## 2018-07-24 SURGERY — COLONOSCOPY WITH PROPOFOL
Anesthesia: General

## 2018-07-24 MED ORDER — GENTAMICIN IN SALINE 1-0.9 MG/ML-% IV SOLN: 100.0000 mg | Freq: Once | INTRAVENOUS | Status: DC

## 2018-07-24 MED ORDER — PROPOFOL 500 MG/50ML IV EMUL
INTRAVENOUS | Status: DC | PRN
  Administered 2018-07-24: 175 ug/kg/min via INTRAVENOUS

## 2018-07-24 MED ORDER — LIDOCAINE HCL (CARDIAC) PF 100 MG/5ML IV SOSY
PREFILLED_SYRINGE | INTRAVENOUS | Status: DC | PRN
  Administered 2018-07-24: 50 mg via INTRAVENOUS

## 2018-07-24 MED ORDER — VANCOMYCIN HCL IN DEXTROSE 1-5 GM/200ML-% IV SOLN
1000.0000 mg | Freq: Once | INTRAVENOUS | Status: AC
  Administered 2018-07-24: 1000 mg via INTRAVENOUS
  Filled 2018-07-24: qty 200

## 2018-07-24 MED ORDER — PROPOFOL 500 MG/50ML IV EMUL
INTRAVENOUS | Status: AC
  Filled 2018-07-24: qty 50

## 2018-07-24 MED ORDER — PROPOFOL 10 MG/ML IV BOLUS
INTRAVENOUS | Status: DC | PRN
  Administered 2018-07-24: 100 mg via INTRAVENOUS

## 2018-07-24 MED ORDER — SODIUM CHLORIDE 0.9 % IV SOLN
INTRAVENOUS | Status: DC
  Administered 2018-07-24: 1000 mL via INTRAVENOUS

## 2018-07-24 MED ORDER — SODIUM CHLORIDE 0.9 % IV SOLN
Freq: Once | INTRAVENOUS | Status: AC
  Administered 2018-07-24: 15:00:00 via INTRAVENOUS
  Filled 2018-07-24: qty 2.5

## 2018-07-24 NOTE — H&P (Signed)
Primary Care Physician:  [place] Pink, MD Primary Gastroenterologist:  Dr. Vira Agar  Pre-Procedure History & Physical: HPI:  Teresa Sparks is a 67 y.o. female is here for an colonoscopy.  For personal history of colon polyps and FH colon polyps, last one 05/18/13   Past Medical History:  Diagnosis Date  . Adenomatous colon polyp   . Adrenal adenoma   . Allergic genetic state   . Arthritis   . Chronic abdominal pain   . GERD (gastroesophageal reflux disease)   . Hiatal hernia   . Hypertension     Past Surgical History:  Procedure Laterality Date  . ABDOMINAL HYSTERECTOMY    . CHOLECYSTECTOMY    . COLONOSCOPY    . FRACTURE SURGERY    . JOINT REPLACEMENT     6 years ago    Prior to Admission medications   Medication Sig Start Date End Date Taking? Authorizing Provider  aspirin 81 MG chewable tablet Chew by mouth daily.   Yes [provider]  cetirizine (ZYRTEC) 10 MG tablet Take 10 mg by mouth daily.   Yes [provider]  fluticasone (FLONASE) 50 MCG/ACT nasal spray Place into both nostrils daily.   Yes [provider]  gabapentin (NEURONTIN) 300 MG capsule Take 300 mg by mouth daily.   Yes [provider]  hydrALAZINE (APRESOLINE) 50 MG tablet Take 50 mg by mouth 2 (two) times daily.   Yes [provider]  lisinopril-hydrochlorothiazide (PRINZIDE,ZESTORETIC) 20-25 MG tablet Take 1 tablet by mouth daily.   Yes [provider]  Metoprolol Succinate 25 MG CS24 Take by mouth daily.   Yes [provider]  Multiple Vitamins-Minerals (CENTRUM ADULTS PO) Take by mouth daily.   Yes [provider]  naproxen (NAPROSYN) 500 MG tablet Take 500 mg by mouth 2 (two) times daily with a meal.   Yes [provider]  omeprazole (PRILOSEC) 40 MG capsule Take 40 mg by mouth 2 (two) times daily.   Yes [provider]  diazepam (VALIUM) 2 MG tablet Take 1 tablet (2 mg total) by mouth every 8 (eight)  hours as needed for muscle spasms. Patient not taking: Reported on 07/24/2018 04/23/18 04/23/19  Delman Kitten, MD  DULoxetine (CYMBALTA) 30 MG capsule Take 30 mg by mouth daily.    [provider]    Allergies as of 05/16/2018 - Review Complete 04/23/2018  Allergen Reaction Noted  . Ivp dye [iodinated diagnostic agents] Anaphylaxis 08/08/2016  . Sulfa antibiotics Rash 08/08/2016    Family History  Problem Relation Age of Onset  . Hypertension Mother   . Stroke Mother   . Epilepsy Mother   . Colon polyps Sister   . Breast cancer Neg Hx     Social History   Socioeconomic History  . Marital status: Married    Spouse name: Not on file  . Number of children: Not on file  . Years of education: Not on file  . Highest education level: Not on file  Occupational History  . Not on file  Social Needs  . Financial resource strain: Not on file  . Food insecurity:    Worry: Not on file    Inability: Not on file  . Transportation needs:    Medical: Not on file    Non-medical: Not on file  Tobacco Use  . Smoking status: Former Research scientist (life sciences)  . Smokeless tobacco: Never Used  Substance and Sexual Activity  . Alcohol use: No  . Drug use: Never  .  Sexual activity: Not on file  Lifestyle  . Physical activity:    Days per week: Not on file    Minutes per session: Not on file  . Stress: Not on file  Relationships  . Social connections:    Talks on phone: Not on file    Gets together: Not on file    Attends religious service: Not on file    Active member of club or organization: Not on file    Attends meetings of clubs or organizations: Not on file    Relationship status: Not on file  . Intimate partner violence:    Fear of current or ex partner: Not on file    Emotionally abused: Not on file    Physically abused: Not on file    Forced sexual activity: Not on file  Other Topics Concern  . Not on file  Social History Narrative  . Not on file    Review of Systems: See HPI,  otherwise negative ROS  Physical Exam: BP (!) 167/73   Pulse 74   Temp 98.2 F (36.8 C) (Tympanic)   Resp 18   Ht 5\' 9"  (1.753 m)   Wt 109.3 kg   SpO2 100%   BMI 35.59 kg/m  General:   Alert,  pleasant and cooperative in NAD Head:  Normocephalic and atraumatic. Neck:  Supple; no masses or thyromegaly. Lungs:  Clear throughout to auscultation.    Heart:  Regular rate and rhythm. Abdomen:  Soft, nontender and nondistended. Normal bowel sounds, without guarding, and without rebound.   Neurologic:  Alert and  oriented x4;  grossly normal neurologically.  Impression/Plan: Teresa Sparks is here for an colonoscopy to be performed for [facility] colon polyp  Risks, benefits, limitations, and alternatives regarding  colonoscopy have been reviewed with the patient.  Questions have been answered.  All parties agreeable.   Gaylyn Cheers, MD  07/24/2018, 3:50 PM

## 2018-07-24 NOTE — Anesthesia Post-op Follow-up Note (Signed)
Anesthesia QCDR form completed.        

## 2018-07-24 NOTE — Transfer of Care (Signed)
Immediate Anesthesia Transfer of Care Note  Patient: Teresa Sparks  Procedure(s) Performed: COLONOSCOPY WITH PROPOFOL (N/A )  Patient Location: Endoscopy Unit  Anesthesia Type:General  Level of Consciousness: awake  Airway & Oxygen Therapy: Patient Spontanous Breathing  Post-op Assessment: Post -op Vital signs reviewed and stable  Post vital signs: stable  Last Vitals:  Vitals Value Taken Time  BP    Temp    Pulse    Resp    SpO2      Last Pain:  Vitals:   07/24/18 1359  TempSrc: Tympanic  PainSc: 0-No pain         Complications: No apparent anesthesia complications

## 2018-07-24 NOTE — Anesthesia Postprocedure Evaluation (Signed)
Anesthesia Post Note  Patient: Teresa Sparks  Procedure(s) Performed: COLONOSCOPY WITH PROPOFOL (N/A )  Patient location during evaluation: PACU Anesthesia Type: General Level of consciousness: awake and alert Pain management: pain level controlled Vital Signs Assessment: post-procedure vital signs reviewed and stable Respiratory status: spontaneous breathing, nonlabored ventilation and respiratory function stable Cardiovascular status: blood pressure returned to baseline and stable Postop Assessment: no apparent nausea or vomiting Anesthetic complications: no     Last Vitals:  Vitals:   07/24/18 1629 07/24/18 1639  BP: (!) 155/69 (!) 172/76  Pulse: 65 65  Resp: 16 12  Temp:    SpO2: 98% 100%    Last Pain:  Vitals:   07/24/18 1639  TempSrc:   PainSc: 0-No pain                 Durenda Hurt

## 2018-07-24 NOTE — Anesthesia Preprocedure Evaluation (Addendum)
Anesthesia Evaluation  Patient identified by MRN, date of birth, ID band Patient awake    Reviewed: Allergy & Precautions, H&P , NPO status , Patient's Chart, lab work & pertinent test results  History of Anesthesia Complications (+) DIFFICULT IV STICK / SPECIAL LINE and history of anesthetic complications  Airway Mallampati: III  TM Distance: <3 FB Neck ROM: full    Dental  (+) Chipped, Poor Dentition   Pulmonary neg shortness of breath, sleep apnea , former smoker,           Cardiovascular Exercise Tolerance: Good hypertension, (-) angina(-) Past MI and (-) DOE      Neuro/Psych negative neurological ROS  negative psych ROS   GI/Hepatic Neg liver ROS, hiatal hernia, GERD  Medicated and Controlled,  Endo/Other  negative endocrine ROS  Renal/GU negative Renal ROS  negative genitourinary   Musculoskeletal  (+) Arthritis ,   Abdominal   Peds  Hematology negative hematology ROS (+)   Anesthesia Other Findings Past Medical History: No date: Adenomatous colon polyp No date: Adrenal adenoma No date: Allergic genetic state No date: Arthritis No date: Chronic abdominal pain No date: GERD (gastroesophageal reflux disease) No date: Hiatal hernia No date: Hypertension  Past Surgical History: No date: ABDOMINAL HYSTERECTOMY No date: CHOLECYSTECTOMY No date: COLONOSCOPY No date: FRACTURE SURGERY No date: JOINT REPLACEMENT     Comment:  6 years ago  BMI    Body Mass Index:  35.59 kg/m      Reproductive/Obstetrics negative OB ROS                            Anesthesia Physical Anesthesia Plan  ASA: III  Anesthesia Plan: General   Post-op Pain Management:    Induction: Intravenous  PONV Risk Score and Plan: Propofol infusion and TIVA  Airway Management Planned: Natural Airway and Nasal Cannula  Additional Equipment:   Intra-op Plan:   Post-operative Plan:   Informed  Consent: I have reviewed the patients History and Physical, chart, labs and discussed the procedure including the risks, benefits and alternatives for the proposed anesthesia with the patient or authorized representative who has indicated his/her understanding and acceptance.   Dental Advisory Given  Plan Discussed with: Anesthesiologist, CRNA and Surgeon  Anesthesia Plan Comments: (Patient consented for risks of anesthesia including but not limited to:  - adverse reactions to medications - risk of intubation if required - damage to teeth, lips or other oral mucosa - sore throat or hoarseness - Damage to heart, brain, lungs or loss of life  Patient voiced understanding.)        Anesthesia Quick Evaluation

## 2018-07-24 NOTE — Op Note (Signed)
[facility] Gastroenterology Patient Name: Teresa Sparks Procedure Date: 07/24/2018 3:51 PM MRN: [id number] Account #: [id number] Date of Birth: 01-Apr-1951 Admit Type: Outpatient Age: 67 Room: [facility] ENDO ROOM 2 Gender: Female Note Status: Finalized Procedure:            Colonoscopy Indications:          High risk colon cancer surveillance: Personal history                        of colonic polyps Providers:            Manya Silvas, MD Referring MD:         Irven Easterly. Kary Kos, MD (Referring MD) Medicines:            Propofol per Anesthesia Complications:        No immediate complications. Procedure:            Pre-Anesthesia Assessment:                       - After reviewing the risks and benefits, the patient                        was deemed in satisfactory condition to undergo the                        procedure.                       After obtaining informed consent, the colonoscope was                        passed under direct vision. Throughout the procedure,                        the patient's blood pressure, pulse, and oxygen                        saturations were monitored continuously. The                        Colonoscope was introduced through the anus and                        advanced to the the cecum, identified by appendiceal                        orifice and ileocecal valve. The colonoscopy was                        performed without difficulty. The patient tolerated the                        procedure well. The quality of the bowel preparation                        was excellent. Findings:      A diminutive polyp was found in the ascending colon. The polyp was       sessile. removed with cold snare.      A diminutive polyp was found in the ascending colon. The polyp was       sessile. The  polyp was removed with a jumbo cold forceps. Resection and       retrieval were complete.      A diminutive polyp was found in the sigmoid  colon. The polyp was       sessile. The polyp was removed with a jumbo cold forceps. Resection and       retrieval were complete.      Internal hemorrhoids were found during endoscopy. The hemorrhoids were       small and Grade I (internal hemorrhoids that do not prolapse). Impression:           - One diminutive polyp in the ascending colon.                       - One diminutive polyp in the ascending colon, removed                        with a jumbo cold forceps. Resected and retrieved.                       - One diminutive polyp in the sigmoid colon, removed                        with a jumbo cold forceps. Resected and retrieved.                       - Internal hemorrhoids. Recommendation:       - Await pathology results. Manya Silvas, MD 07/24/2018 4:18:25 PM This report has been signed electronically. Number of Addenda: 0 Note Initiated On: 07/24/2018 3:51 PM Scope Withdrawal Time: 0 hours 13 minutes 59 seconds  Total Procedure Duration: 0 hours 17 minutes 5 seconds       [facility]

## 2018-07-25 ENCOUNTER — Encounter: Payer: Self-pay | Admitting: Unknown Physician Specialty

## 2018-07-26 LAB — SURGICAL PATHOLOGY

## 2018-10-06 ENCOUNTER — Other Ambulatory Visit: Payer: Self-pay | Admitting: Family Medicine

## 2018-10-06 DIAGNOSIS — Z1231 Encounter for screening mammogram for malignant neoplasm of breast: Secondary | ICD-10-CM

## 2018-11-13 ENCOUNTER — Ambulatory Visit
Admission: RE | Admit: 2018-11-13 | Discharge: 2018-11-13 | Disposition: A | Discharge status: Home / Self Care | Payer: Medicare Other | Source: Ambulatory Visit | Attending: Family Medicine | Admitting: Family Medicine

## 2018-11-13 DIAGNOSIS — Z1231 Encounter for screening mammogram for malignant neoplasm of breast: Secondary | ICD-10-CM | POA: Diagnosis not present

## 2019-10-25 ENCOUNTER — Other Ambulatory Visit: Payer: Self-pay | Admitting: Family Medicine

## 2019-10-25 DIAGNOSIS — Z1231 Encounter for screening mammogram for malignant neoplasm of breast: Secondary | ICD-10-CM

## 2019-11-23 ENCOUNTER — Ambulatory Visit
Admission: RE | Admit: 2019-11-23 | Discharge: 2019-11-23 | Disposition: A | Discharge status: Home / Self Care | Payer: Medicare PPO | Source: Ambulatory Visit | Attending: Family Medicine | Admitting: Family Medicine

## 2019-11-23 DIAGNOSIS — Z1231 Encounter for screening mammogram for malignant neoplasm of breast: Secondary | ICD-10-CM | POA: Diagnosis present

## 2020-04-09 IMAGING — MG DIGITAL SCREENING BILAT W/ TOMO W/ CAD
6 of 12 series · 6 of 36 positions shown · non-contrast
Comparison: Previous exam(s).

CLINICAL DATA: Screening.

EXAM:
DIGITAL SCREENING BILATERAL MAMMOGRAM WITH TOMO AND CAD

[R MLO synth-2D]
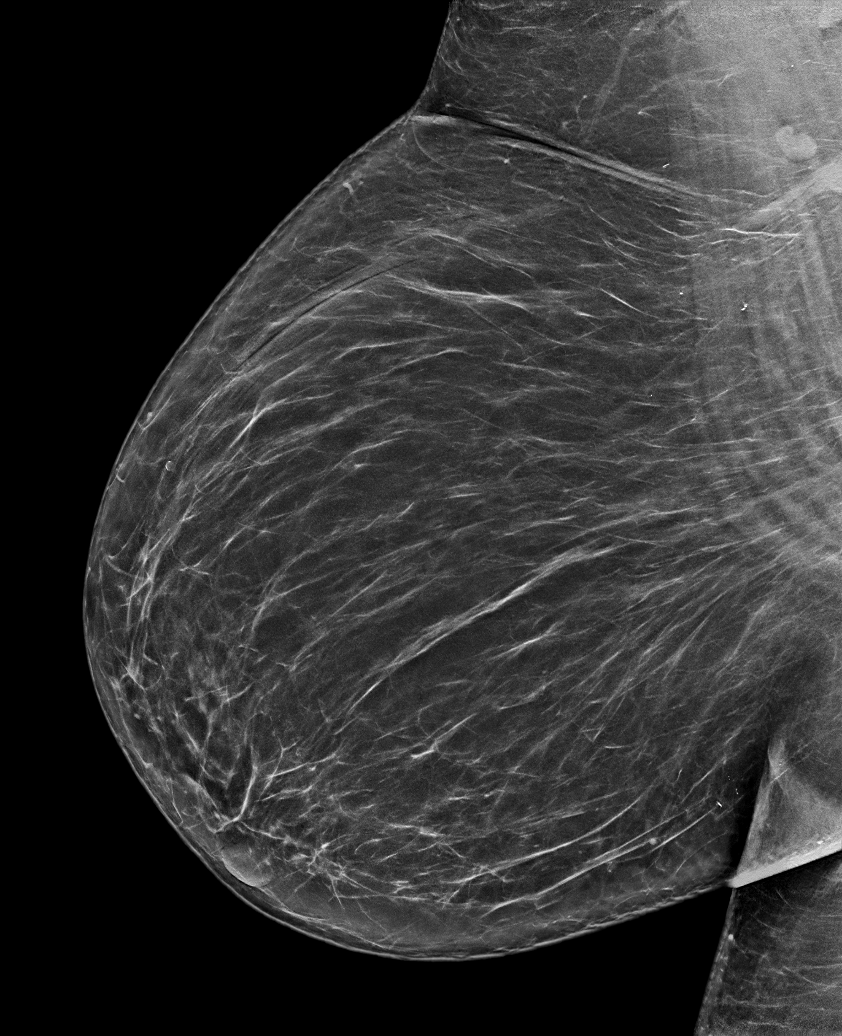

[R CC synth-2D (1 of 2)]
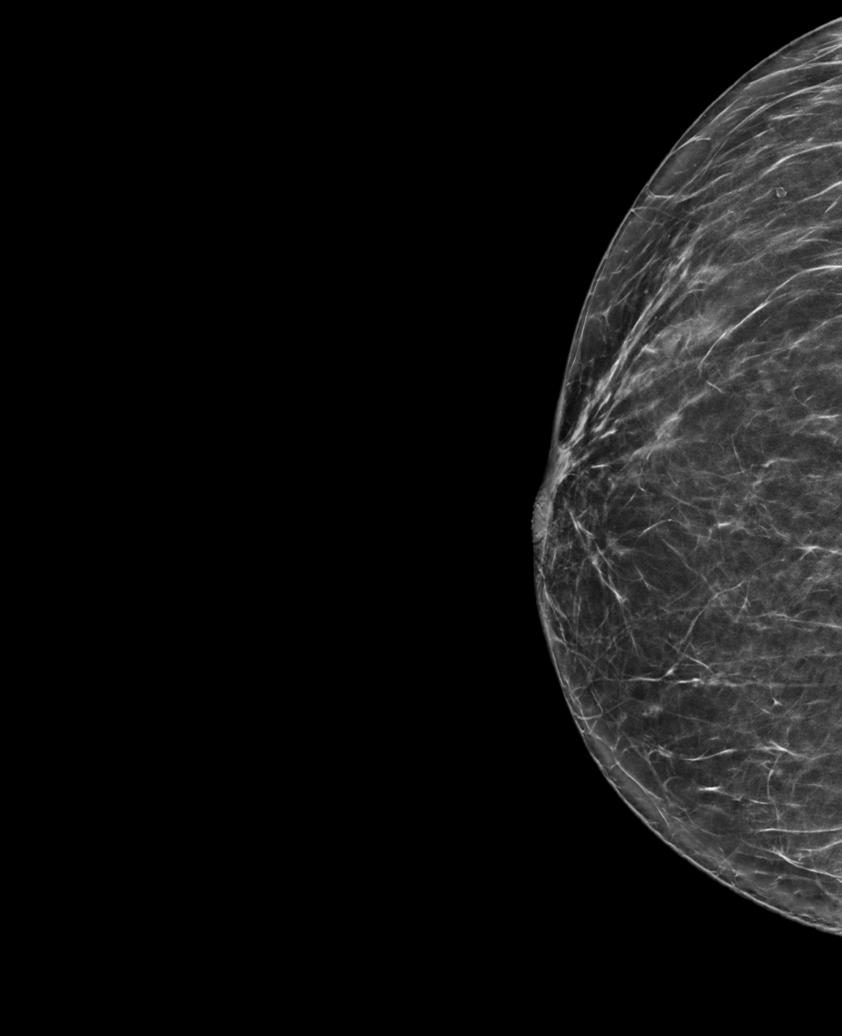

[L MLO synth-2D]
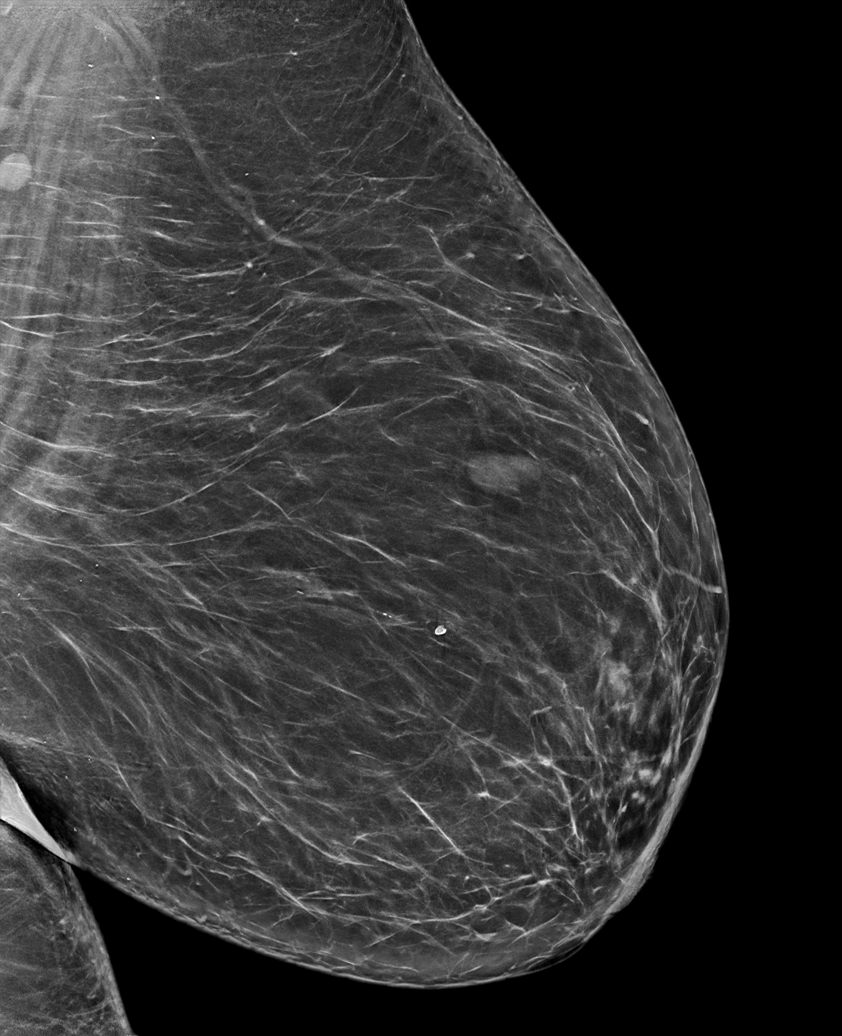

[L CC synth-2D]
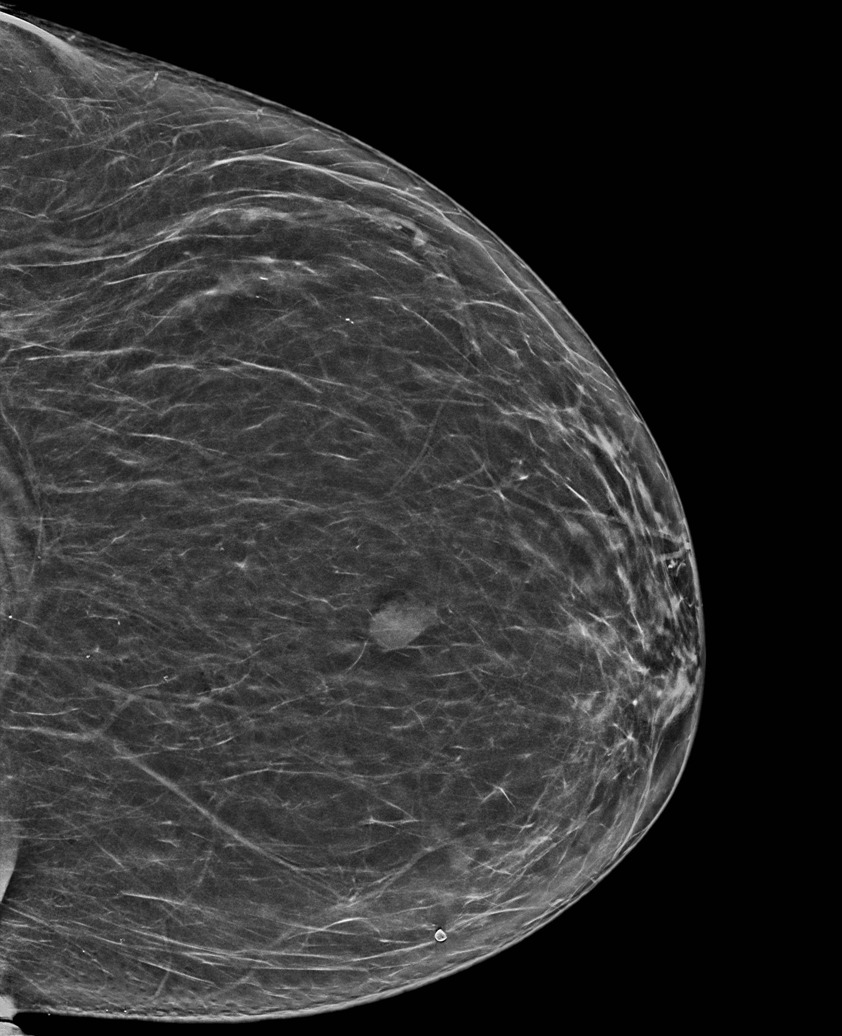

[R CC synth-2D (2 of 2)]
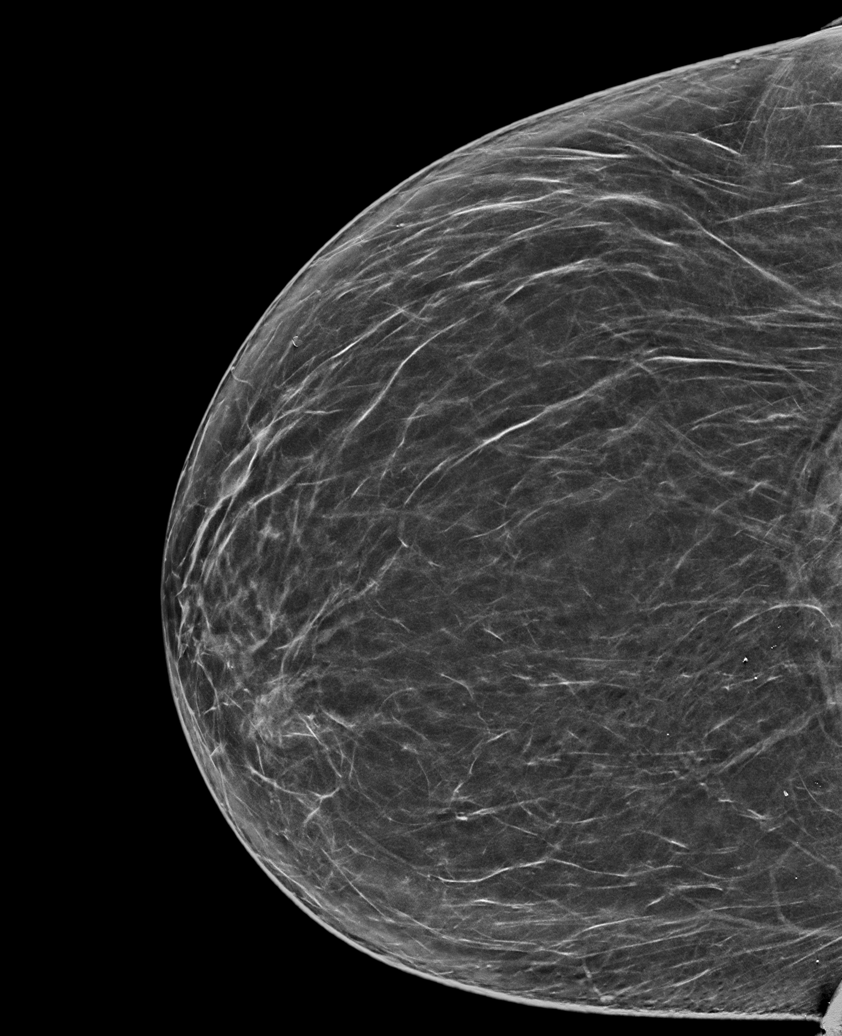

[L CV synth-2D]
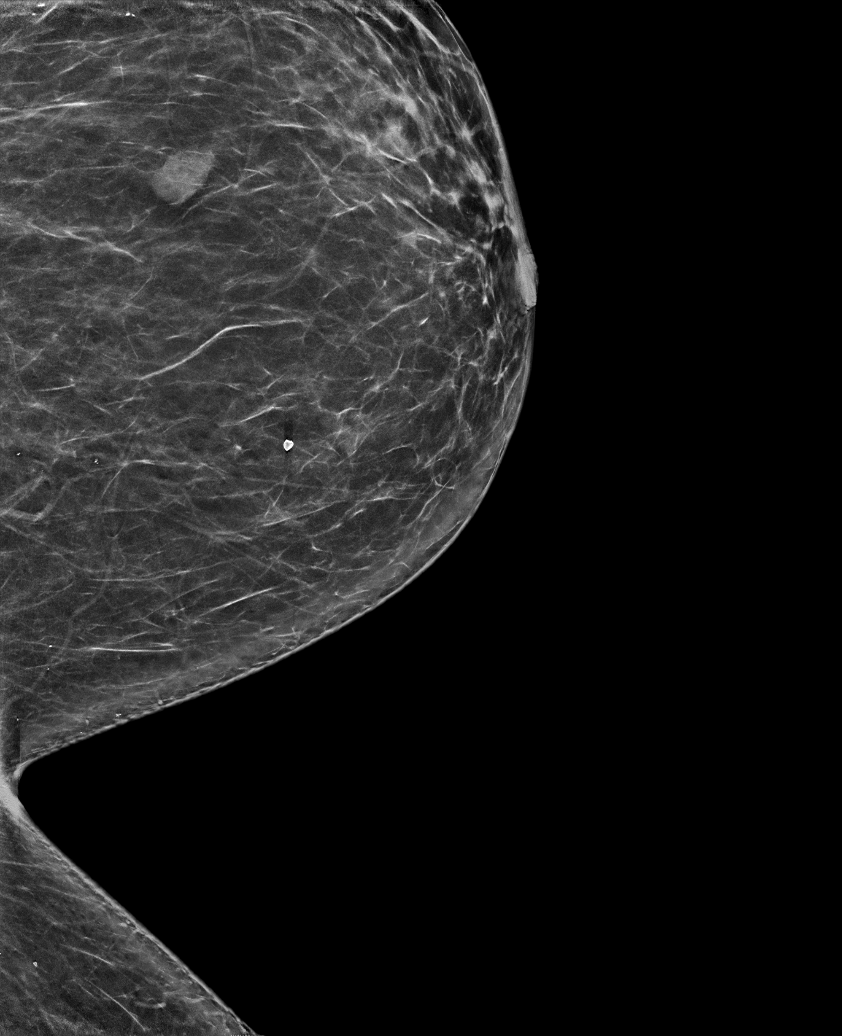

[6 of 36 positions shown; findings below may reference images not displayed]

ACR Breast Density Category b: There are scattered areas of
fibroglandular density.
FINDINGS: There are no findings suspicious for malignancy. Images were
processed with CAD.
IMPRESSION: No mammographic evidence of malignancy. A result letter of this
screening mammogram will be mailed directly to the patient.

RECOMMENDATION:
Screening mammogram in one year. (Code:CN-[id number])

BI-RADS CATEGORY  1: Negative.

## 2020-10-20 ENCOUNTER — Other Ambulatory Visit: Payer: Self-pay | Admitting: Family Medicine

## 2020-10-20 DIAGNOSIS — Z1231 Encounter for screening mammogram for malignant neoplasm of breast: Secondary | ICD-10-CM

## 2020-11-24 ENCOUNTER — Ambulatory Visit
Admission: RE | Admit: 2020-11-24 | Discharge: 2020-11-24 | Disposition: A | Discharge status: Home / Self Care | Payer: Medicare PPO | Source: Ambulatory Visit | Attending: Family Medicine | Admitting: Family Medicine

## 2020-11-24 ENCOUNTER — Other Ambulatory Visit: Payer: Self-pay

## 2020-11-24 DIAGNOSIS — Z1231 Encounter for screening mammogram for malignant neoplasm of breast: Secondary | ICD-10-CM | POA: Diagnosis not present

## 2021-03-11 ENCOUNTER — Other Ambulatory Visit: Payer: Self-pay | Admitting: Orthopedic Surgery

## 2021-03-11 DIAGNOSIS — M4807 Spinal stenosis, lumbosacral region: Secondary | ICD-10-CM

## 2021-03-25 ENCOUNTER — Ambulatory Visit
Admission: RE | Admit: 2021-03-25 | Discharge: 2021-03-25 | Disposition: A | Discharge status: Home / Self Care | Payer: Medicare PPO | Source: Ambulatory Visit | Attending: Orthopedic Surgery | Admitting: Orthopedic Surgery

## 2021-03-25 ENCOUNTER — Other Ambulatory Visit: Payer: Self-pay

## 2021-03-25 DIAGNOSIS — M4807 Spinal stenosis, lumbosacral region: Secondary | ICD-10-CM | POA: Insufficient documentation

## 2021-06-26 ENCOUNTER — Other Ambulatory Visit: Payer: Self-pay

## 2021-06-26 ENCOUNTER — Emergency Department: Payer: Medicare PPO

## 2021-06-26 ENCOUNTER — Emergency Department
Admission: EM | Admit: 2021-06-26 | Discharge: 2021-06-26 | Disposition: A | Discharge status: Home / Self Care | Payer: Medicare PPO | Attending: Emergency Medicine | Admitting: Emergency Medicine

## 2021-06-26 DIAGNOSIS — R1011 Right upper quadrant pain: Secondary | ICD-10-CM | POA: Insufficient documentation

## 2021-06-26 DIAGNOSIS — Z7982 Long term (current) use of aspirin: Secondary | ICD-10-CM | POA: Insufficient documentation

## 2021-06-26 DIAGNOSIS — Z79899 Other long term (current) drug therapy: Secondary | ICD-10-CM | POA: Insufficient documentation

## 2021-06-26 DIAGNOSIS — R1013 Epigastric pain: Secondary | ICD-10-CM | POA: Diagnosis not present

## 2021-06-26 DIAGNOSIS — I10 Essential (primary) hypertension: Secondary | ICD-10-CM | POA: Insufficient documentation

## 2021-06-26 DIAGNOSIS — N179 Acute kidney failure, unspecified: Secondary | ICD-10-CM | POA: Insufficient documentation

## 2021-06-26 DIAGNOSIS — R531 Weakness: Secondary | ICD-10-CM | POA: Diagnosis present

## 2021-06-26 DIAGNOSIS — Z9660 Presence of unspecified orthopedic joint implant: Secondary | ICD-10-CM | POA: Insufficient documentation

## 2021-06-26 DIAGNOSIS — Z87891 Personal history of nicotine dependence: Secondary | ICD-10-CM | POA: Diagnosis not present

## 2021-06-26 LAB — URINALYSIS, COMPLETE (UACMP) WITH MICROSCOPIC
Bilirubin Urine: NEGATIVE
Glucose, UA: NEGATIVE mg/dL
Hgb urine dipstick: NEGATIVE
Ketones, ur: NEGATIVE mg/dL
Nitrite: NEGATIVE
Protein, ur: NEGATIVE mg/dL
Specific Gravity, Urine: 1.02 (ref 1.005–1.030)
pH: 6 (ref 5.0–8.0)

## 2021-06-26 LAB — HEPATIC FUNCTION PANEL
ALT: 14 U/L (ref 0–44)
AST: 15 U/L (ref 15–41)
Albumin: 3.9 g/dL (ref 3.5–5.0)
Alkaline Phosphatase: 35 U/L — ABNORMAL LOW (ref 38–126)
Bilirubin, Direct: <0.1 mg/dL (ref 0.0–0.2)
Total Bilirubin: 0.5 mg/dL (ref 0.3–1.2)
Total Protein: 7.3 g/dL (ref 6.5–8.1)

## 2021-06-26 LAB — CBC
HCT: 33.9 % — ABNORMAL LOW (ref 36.0–46.0)
Hemoglobin: 10.4 g/dL — ABNORMAL LOW (ref 12.0–15.0)
MCH: 24.2 pg — ABNORMAL LOW (ref 26.0–34.0)
MCHC: 30.7 g/dL (ref 30.0–36.0)
MCV: 78.8 fL — ABNORMAL LOW (ref 80.0–100.0)
Platelets: 373 10*3/uL (ref 150–400)
RBC: 4.3 MIL/uL (ref 3.87–5.11)
RDW: 15.6 % — ABNORMAL HIGH (ref 11.5–15.5)
WBC: 8.9 10*3/uL (ref 4.0–10.5)
nRBC: 0 % (ref 0.0–0.2)

## 2021-06-26 LAB — BASIC METABOLIC PANEL
Anion gap: 9 (ref 5–15)
BUN: 38 mg/dL — ABNORMAL HIGH (ref 8–23)
CO2: 29 mmol/L (ref 22–32)
Calcium: 9.2 mg/dL (ref 8.9–10.3)
Chloride: 104 mmol/L (ref 98–111)
Creatinine, Ser: 2.04 mg/dL — ABNORMAL HIGH (ref 0.44–1.00)
GFR, Estimated: 26 mL/min — ABNORMAL LOW (ref >60)
Glucose, Bld: 103 mg/dL — ABNORMAL HIGH (ref 70–99)
Potassium: 3.6 mmol/L (ref 3.5–5.1)
Sodium: 142 mmol/L (ref 135–145)

## 2021-06-26 LAB — TROPONIN I (HIGH SENSITIVITY): Troponin I (High Sensitivity): 5 ng/L (ref <18)

## 2021-06-26 LAB — LIPASE, BLOOD: Lipase: 24 U/L (ref 11–51)

## 2021-06-26 MED ORDER — FAMOTIDINE 20 MG PO TABS: 20.0000 mg | ORAL_TABLET | Freq: Every day | ORAL | 0 refills | Status: DC

## 2021-06-26 MED ORDER — LACTATED RINGERS IV BOLUS
1000.0000 mL | Freq: Once | INTRAVENOUS | Status: AC
  Administered 2021-06-26: 1000 mL via INTRAVENOUS

## 2021-06-26 MED ORDER — ONDANSETRON 4 MG PO TBDP: 4.0000 mg | ORAL_TABLET | Freq: Three times a day (TID) | ORAL | 0 refills | Status: DC | PRN

## 2021-06-26 MED ORDER — SODIUM CHLORIDE 0.9 % IV BOLUS
1000.0000 mL | Freq: Once | INTRAVENOUS | Status: AC
  Administered 2021-06-26: 1000 mL via INTRAVENOUS

## 2021-06-26 NOTE — ED Triage Notes (Signed)
Pt states she saw her PCP on Wednesday for generalized weakness and fatigue "for awhile". States they called her today due to abnormal kidney function and for further testing

## 2021-06-26 NOTE — Discharge Instructions (Addendum)
STOP taking your lisinopril-HCTZ  STOP taking Naproxen or any ibuprofen, alleve, or other NSAID medications  Follow-up with your PCP in the next 5-7 days for repeat lab work

## 2021-06-26 NOTE — ED Provider Notes (Signed)
[facility] Emergency Department Provider Note  ____________________________________________   Event Date/Time   First MD Initiated Contact with Patient 06/26/21 1135     (approximate)  I have reviewed the triage vital signs and the nursing notes.   HISTORY  Chief Complaint Weakness and Abnormal Lab    HPI Teresa Sparks is a 70 y.o. female with past medical history as below including history of hiatal hernia, GERD, here with generalized weakness per the patient states that over the last several weeks, she has a progressive worsening generalized weakness.  She is been having intermittent right upper quadrant and epigastric discomfort, which she states has decreased her appetite.  She has tried to drink plenty of fluids.  She went to her PCP and had lab work drawn this week, which showed reported kidney injury so she was told to come to the ER for evaluation.  She denies any recent medication changes.  She has not been taking any NSAID medications.  No aspirin use.  No urinary symptoms.  She states she has continued to make urine normally.  No other complaints.  No chest pain or shortness of breath.    Past Medical History:  Diagnosis Date   Adenomatous colon polyp    Adrenal adenoma    Allergic genetic state    Arthritis    Chronic abdominal pain    GERD (gastroesophageal reflux disease)    Hiatal hernia    Hypertension     There are no problems to display for this patient.   Past Surgical History:  Procedure Laterality Date   ABDOMINAL HYSTERECTOMY     CHOLECYSTECTOMY     COLONOSCOPY     COLONOSCOPY WITH PROPOFOL N/A 07/24/2018   Procedure: COLONOSCOPY WITH PROPOFOL;  Surgeon: Manya Silvas, MD;  Location: [facility] ENDOSCOPY;  Service: Endoscopy;  Laterality: N/A;   FRACTURE SURGERY     JOINT REPLACEMENT     6 years ago    Prior to Admission medications   Medication Sig Start Date End Date Taking? Authorizing Provider  famotidine  (PEPCID) 20 MG tablet Take 1 tablet (20 mg total) by mouth daily for 10 days. 06/26/21 07/06/21 Yes Duffy Bruce, MD  ondansetron (ZOFRAN ODT) 4 MG disintegrating tablet Take 1 tablet (4 mg total) by mouth every 8 (eight) hours as needed for nausea or vomiting. 06/26/21  Yes Duffy Bruce, MD  aspirin 81 MG chewable tablet Chew by mouth daily.    [provider]  cetirizine (ZYRTEC) 10 MG tablet Take 10 mg by mouth daily.    [provider]  DULoxetine (CYMBALTA) 30 MG capsule Take 30 mg by mouth daily.    [provider]  fluticasone (FLONASE) 50 MCG/ACT nasal spray Place into both nostrils daily.    [provider]  gabapentin (NEURONTIN) 300 MG capsule Take 300 mg by mouth daily.    [provider]  hydrALAZINE (APRESOLINE) 50 MG tablet Take 50 mg by mouth 2 (two) times daily.    [provider]  Metoprolol Succinate 25 MG CS24 Take by mouth daily.    [provider]  Multiple Vitamins-Minerals (CENTRUM ADULTS PO) Take by mouth daily.    [provider]  omeprazole (PRILOSEC) 40 MG capsule Take 40 mg by mouth 2 (two) times daily.    [provider]    Allergies Ivp dye [iodinated diagnostic agents], Ciprofloxacin, Codeine sulfate [codeine], Meloxicam, Penicillins, Protonix [pantoprazole sodium], Shellfish allergy, and Sulfa antibiotics  Family History  Problem  Relation Age of Onset   Hypertension Mother    Stroke Mother    Epilepsy Mother    Colon polyps Sister    Breast cancer Other     Social History Social History   Tobacco Use   Smoking status: Former   Smokeless tobacco: Never  Scientific laboratory technician Use: Never used  Substance Use Topics   Alcohol use: No   Drug use: Never    Review of Systems  Review of Systems  Constitutional:  Positive for appetite change and fatigue. Negative for fever.  HENT:  Negative for congestion and sore throat.   Eyes:  Negative for visual disturbance.   Respiratory:  Negative for cough and shortness of breath.   Cardiovascular:  Negative for chest pain.  Gastrointestinal:  Negative for abdominal pain, diarrhea, nausea and vomiting.  Genitourinary:  Negative for flank pain.  Musculoskeletal:  Negative for back pain and neck pain.  Skin:  Negative for rash and wound.  Neurological:  Negative for weakness.  All other systems reviewed and are negative.   ____________________________________________  PHYSICAL EXAM:      VITAL SIGNS: ED Triage Vitals  Enc Vitals Group     BP 06/26/21 0947 (!) 130/56     Pulse Rate 06/26/21 0947 79     Resp 06/26/21 0947 20     Temp 06/26/21 0947 98.7 F (37.1 C)     Temp Source 06/26/21 0947 Oral     SpO2 06/26/21 0947 98 %     Weight 06/26/21 0947 233 lb (105.7 kg)     Height 06/26/21 0947 5\' 8"  (1.727 m)     Head Circumference --      Peak Flow --      Pain Score 06/26/21 0945 0     Pain Loc --      Pain Edu? --      Excl. in [place]? --      Physical Exam Vitals and nursing note reviewed.  Constitutional:      General: She is not in acute distress.    Appearance: She is well-developed.  HENT:     Head: Normocephalic and atraumatic.     Mouth/Throat:     Mouth: Mucous membranes are dry.  Eyes:     Conjunctiva/sclera: Conjunctivae normal.  Cardiovascular:     Rate and Rhythm: Normal rate and regular rhythm.     Heart sounds: Normal heart sounds. No murmur heard.   No friction rub.  Pulmonary:     Effort: Pulmonary effort is normal. No respiratory distress.     Breath sounds: Normal breath sounds. No wheezing or rales.  Abdominal:     General: There is no distension.     Palpations: Abdomen is soft.     Tenderness: There is no abdominal tenderness.  Musculoskeletal:     Cervical back: Neck supple.  Skin:    General: Skin is warm.     Capillary Refill: Capillary refill takes less than 2 seconds.  Neurological:     Mental Status: She is alert and oriented to person, place, and  time.     Motor: No abnormal muscle tone.      ____________________________________________   LABS (all labs ordered are listed, but only abnormal results are displayed)  Labs Reviewed  BASIC METABOLIC PANEL - Abnormal; Notable for the following components:      Result Value   Glucose, Bld 103 (*)    BUN 38 (*)    Creatinine, Ser  2.04 (*)    GFR, Estimated 26 (*)    All other components within normal limits  CBC - Abnormal; Notable for the following components:   Hemoglobin 10.4 (*)    HCT 33.9 (*)    MCV 78.8 (*)    MCH 24.2 (*)    RDW 15.6 (*)    All other components within normal limits  URINALYSIS, COMPLETE (UACMP) WITH MICROSCOPIC - Abnormal; Notable for the following components:   Leukocytes,Ua TRACE (*)    Bacteria, UA RARE (*)    All other components within normal limits  HEPATIC FUNCTION PANEL - Abnormal; Notable for the following components:   Alkaline Phosphatase 35 (*)    All other components within normal limits  LIPASE, BLOOD  CBG MONITORING, ED  TROPONIN I (HIGH SENSITIVITY)    ____________________________________________  EKG: Normal sinus rhythm, VR 62. QRS 99, QTc 457. No acute ST elevations or depressions. ________________________________________  RADIOLOGY All imaging, including plain films, CT scans, and ultrasounds, independently reviewed by me, and interpretations confirmed via formal radiology reads.  ED MD interpretation:   CT A/P: Negative for acute process, trace free fluid  Official radiology report(s): CT ABDOMEN PELVIS WO CONTRAST  Result Date: 06/26/2021 CLINICAL DATA:  In a 70 year old female. EXAM: CT ABDOMEN AND PELVIS WITHOUT CONTRAST TECHNIQUE: Multidetector CT imaging of the abdomen and pelvis was performed following the standard protocol without IV contrast. COMPARISON:  August 10, 2013. FINDINGS: Lower chest: Lung bases are clear. No effusion. No consolidative process. Hepatobiliary: Post cholecystectomy. Streak artifact from  cholecystectomy clips. Mildly lobular hepatic contours without visible lesion on noncontrast imaging. No gross biliary duct dilation. Pancreas: Pancreas smooth contours, no signs of inflammation. Spleen: Normal. Adrenals/Urinary Tract: Adrenal glands with LEFT adrenal lesion at 22 Hounsfield units, well-circumscribed at 2.3 cm (image 19/2) unchanged dating back to 2014, 2/3 of this area measuring 6 Hounsfield units. Previously characterized as an adrenal adenoma on the MRI from 2009. No nephrolithiasis. No ureteral calculi or hydronephrosis. No perivesical stranding. Stomach/Bowel: No acute gastrointestinal process. Appendix is normal. Arising from the cecal tip and extending into the RIGHT hemipelvis. Vascular/Lymphatic: Atherosclerotic changes of the abdominal aorta without aneurysm. Smooth contour of the aorta and IVC. There is no gastrohepatic or hepatoduodenal ligament lymphadenopathy. No retroperitoneal or mesenteric lymphadenopathy. No pelvic sidewall lymphadenopathy. Reproductive: Post hysterectomy without adnexal mass. Other: Trace fluid or stranding in the pelvis no signs of free air. Musculoskeletal: No acute musculoskeletal process. Spinal degenerative changes. IMPRESSION: No acute gastrointestinal process. Post cholecystectomy with normal appendix specifically. Trace free fluid or pelvic stranding may reflect mild inflammatory changes, somewhat unusual in a patient of this age quite mild on the current though study. No signs of bowel thickening or other process that would explain these findings. LEFT adrenal adenoma. Aortic Atherosclerosis (ICD10-I70.0). Electronically Signed   By: Zetta Bills M.D.   On: 06/26/2021 13:19    ____________________________________________  PROCEDURES   Procedure(s) performed (including Critical Care):  Procedures  ____________________________________________  INITIAL IMPRESSION / MDM / [place] / ED COURSE  As part of my medical decision making,  I reviewed the following data within the [place] notes reviewed and incorporated, Old chart reviewed, Notes from prior ED visits, and [place] Controlled Substance Database       *BABS DABBS was evaluated in Emergency Department on 06/26/2021 for the symptoms described in the history of present illness. She was evaluated in the context of the global COVID-19 pandemic, which necessitated consideration  that the patient might be at risk for infection with the SARS-CoV-2 virus that causes COVID-19. Institutional protocols and algorithms that pertain to the evaluation of patients at risk for COVID-19 are in a state of rapid change based on information released by regulatory bodies including the CDC and federal and state organizations. These policies and algorithms were followed during the patient's care in the ED.  Some ED evaluations and interventions may be delayed as a result of limited staffing during the pandemic.*     Medical Decision Making:  70 yo F sent here for abnormal lab work. Pt has had some mild epigastric discomfort, decreased PO intake. Lab work reviewed, shows mild AKI with BUn 38, Cr 2.04. Mild anemia noted. LFTs and lipase normal. UA without UTI. Negative protein. CT a/p obtained, reviewed, shows no acute intra-abd pathology. Trace free fluid is nonspecific, and pt has no abd TTP.   Suspect mild AKI 2/2 combination of dehydration, possibly 2/2 gastritis/PUD, also lisinopril-hctz use. Discussed with Nephrology. Will have pt hold her ACE and diuretic, hydrate at home, and f/u as outpt. Encouraged balanced diet. Will add pepcid for gerd tx. ____________________________________________  FINAL CLINICAL IMPRESSION(S) / ED DIAGNOSES  Final diagnoses:  AKI (acute kidney injury) ([place])     MEDICATIONS GIVEN DURING THIS VISIT:  Medications  sodium chloride 0.9 % bolus 1,000 mL (0 mLs Intravenous Stopped 06/26/21 1310)  lactated ringers bolus 1,000 mL (0 mLs  Intravenous Stopped 06/26/21 1524)     ED Discharge Orders          Ordered    famotidine (PEPCID) 20 MG tablet  Daily        06/26/21 1513    ondansetron (ZOFRAN ODT) 4 MG disintegrating tablet  Every 8 hours PRN        06/26/21 1513             Note:  This document was prepared using Dragon voice recognition software and may include unintentional dictation errors.   Duffy Bruce, MD 06/26/21 [phone]

## 2021-07-29 ENCOUNTER — Encounter: Payer: Self-pay | Admitting: Emergency Medicine

## 2021-07-29 ENCOUNTER — Emergency Department
Admission: EM | Admit: 2021-07-29 | Discharge: 2021-07-29 | Disposition: A | Discharge status: Home / Self Care | Payer: Medicare PPO | Attending: Emergency Medicine | Admitting: Emergency Medicine

## 2021-07-29 ENCOUNTER — Other Ambulatory Visit: Payer: Self-pay

## 2021-07-29 DIAGNOSIS — I10 Essential (primary) hypertension: Secondary | ICD-10-CM | POA: Diagnosis not present

## 2021-07-29 DIAGNOSIS — K2950 Unspecified chronic gastritis without bleeding: Secondary | ICD-10-CM | POA: Insufficient documentation

## 2021-07-29 DIAGNOSIS — Z7982 Long term (current) use of aspirin: Secondary | ICD-10-CM | POA: Insufficient documentation

## 2021-07-29 DIAGNOSIS — R42 Dizziness and giddiness: Secondary | ICD-10-CM | POA: Insufficient documentation

## 2021-07-29 DIAGNOSIS — Z87891 Personal history of nicotine dependence: Secondary | ICD-10-CM | POA: Diagnosis not present

## 2021-07-29 DIAGNOSIS — Z79899 Other long term (current) drug therapy: Secondary | ICD-10-CM | POA: Diagnosis not present

## 2021-07-29 DIAGNOSIS — R1010 Upper abdominal pain, unspecified: Secondary | ICD-10-CM | POA: Diagnosis present

## 2021-07-29 LAB — CBC
HCT: 31.9 % — ABNORMAL LOW (ref 36.0–46.0)
Hemoglobin: 10.2 g/dL — ABNORMAL LOW (ref 12.0–15.0)
MCH: 24.9 pg — ABNORMAL LOW (ref 26.0–34.0)
MCHC: 32 g/dL (ref 30.0–36.0)
MCV: 77.8 fL — ABNORMAL LOW (ref 80.0–100.0)
Platelets: 433 10*3/uL — ABNORMAL HIGH (ref 150–400)
RBC: 4.1 MIL/uL (ref 3.87–5.11)
RDW: 15.2 % (ref 11.5–15.5)
WBC: 8.2 10*3/uL (ref 4.0–10.5)
nRBC: 0 % (ref 0.0–0.2)

## 2021-07-29 LAB — COMPREHENSIVE METABOLIC PANEL
ALT: 14 U/L (ref 0–44)
AST: 20 U/L (ref 15–41)
Albumin: 4 g/dL (ref 3.5–5.0)
Alkaline Phosphatase: 34 U/L — ABNORMAL LOW (ref 38–126)
Anion gap: 10 (ref 5–15)
BUN: 16 mg/dL (ref 8–23)
CO2: 26 mmol/L (ref 22–32)
Calcium: 9.4 mg/dL (ref 8.9–10.3)
Chloride: 103 mmol/L (ref 98–111)
Creatinine, Ser: 1.08 mg/dL — ABNORMAL HIGH (ref 0.44–1.00)
GFR, Estimated: 56 mL/min — ABNORMAL LOW (ref >60)
Glucose, Bld: 114 mg/dL — ABNORMAL HIGH (ref 70–99)
Potassium: 3.2 mmol/L — ABNORMAL LOW (ref 3.5–5.1)
Sodium: 139 mmol/L (ref 135–145)
Total Bilirubin: 0.6 mg/dL (ref 0.3–1.2)
Total Protein: 7.4 g/dL (ref 6.5–8.1)

## 2021-07-29 LAB — LIPASE, BLOOD: Lipase: 26 U/L (ref 11–51)

## 2021-07-29 LAB — TROPONIN I (HIGH SENSITIVITY)
Troponin I (High Sensitivity): 4 ng/L (ref <18)
Troponin I (High Sensitivity): 4 ng/L (ref <18)

## 2021-07-29 MED ORDER — POTASSIUM CHLORIDE 20 MEQ PO PACK
40.0000 meq | PACK | ORAL | Status: AC
  Administered 2021-07-29: 40 meq via ORAL
  Filled 2021-07-29: qty 2

## 2021-07-29 MED ORDER — ALUM & MAG HYDROXIDE-SIMETH 200-200-20 MG/5ML PO SUSP
30.0000 mL | Freq: Once | ORAL | Status: AC
  Administered 2021-07-29: 30 mL via ORAL
  Filled 2021-07-29: qty 30

## 2021-07-29 MED ORDER — TRAMADOL HCL 50 MG PO TABS: 50.0000 mg | ORAL_TABLET | Freq: Four times a day (QID) | ORAL | 0 refills | Status: DC | PRN

## 2021-07-29 MED ORDER — LIDOCAINE VISCOUS HCL 2 % MT SOLN
15.0000 mL | Freq: Once | OROMUCOSAL | Status: AC
  Administered 2021-07-29: 15 mL via ORAL
  Filled 2021-07-29: qty 15

## 2021-07-29 NOTE — ED Triage Notes (Signed)
Pt comes into the ED via POV c/o upper abd pain that runs from the epigastric area to the back.  PT does admit to dizziness associated with the pain as well.  Pt also has been having nausea and constipation. Pt presents labored with exertion.  Pt denies any cardiac problems.

## 2021-07-29 NOTE — ED Notes (Signed)
Dc instructions and scripts reviewed with pt. No questions or concerns at this time. Pt states she feels so much better. Declined need for a wheelchair. Ambulated to lobby without difficulty.

## 2021-07-29 NOTE — Discharge Instructions (Addendum)
Please call your gastroenterology of clinic tomorrow to set up a follow-up appointment.  Do not drive or work in dangerous locations while taking tramadol.  Please return to the emergency room right away if you are to develop a fever, severe nausea, your pain becomes severe or worsens, you are unable to keep food down, begin vomiting any dark or bloody fluid, you develop any dark or bloody stools, feel dehydrated, or other new concerns or symptoms arise.

## 2021-08-15 NOTE — ED Provider Notes (Signed)
[facility] Emergency Department Provider Note   ____________________________________________   Event Date/Time   First MD Initiated Contact with Patient 07/29/21 1543     (approximate)  I have reviewed the triage vital signs and the nursing notes.   HISTORY  Chief Complaint Abdominal Pain and Dizziness    HPI Teresa Sparks is a 70 y.o. female was been experiencing pain that comes and goes in her upper abdomen for well over a month now.  She was seen in the ER originally, and then has followed up since with gastroenterology.  She is started new medication but continues to have episodes of a pain that is difficult to describe spasm-like in her upper middle abdomen that will come and go. When the pain is present it does feel nauseating also has a feeling of constipation.  Sometimes she feel lightheaded or dizzy with it  No numbness tingling or weakness.  No chest pain and no difficulty breathing.  Denies history of cardiac disease  Past Medical History:  Diagnosis Date   Adenomatous colon polyp    Adrenal adenoma    Allergic genetic state    Arthritis    Chronic abdominal pain    GERD (gastroesophageal reflux disease)    Hiatal hernia    Hypertension     There are no problems to display for this patient.   Past Surgical History:  Procedure Laterality Date   ABDOMINAL HYSTERECTOMY     CHOLECYSTECTOMY     COLONOSCOPY     COLONOSCOPY WITH PROPOFOL N/A 07/24/2018   Procedure: COLONOSCOPY WITH PROPOFOL;  Surgeon: Manya Silvas, MD;  Location: [facility] ENDOSCOPY;  Service: Endoscopy;  Laterality: N/A;   FRACTURE SURGERY     JOINT REPLACEMENT     6 years ago    Prior to Admission medications   Medication Sig Start Date End Date Taking? Authorizing Provider  traMADol (ULTRAM) 50 MG tablet Take 1 tablet (50 mg total) by mouth every 6 (six) hours as needed for severe pain. 07/29/21  Yes Delman Kitten, MD  aspirin 81 MG chewable tablet Chew by  mouth daily.    [provider]  cetirizine (ZYRTEC) 10 MG tablet Take 10 mg by mouth daily.    [provider]  DULoxetine (CYMBALTA) 30 MG capsule Take 30 mg by mouth daily.    [provider]  famotidine (PEPCID) 20 MG tablet Take 1 tablet (20 mg total) by mouth daily for 10 days. 06/26/21 07/06/21  Duffy Bruce, MD  fluticasone (FLONASE) 50 MCG/ACT nasal spray Place into both nostrils daily.    [provider]  gabapentin (NEURONTIN) 300 MG capsule Take 300 mg by mouth daily.    [provider]  hydrALAZINE (APRESOLINE) 50 MG tablet Take 50 mg by mouth 2 (two) times daily.    [provider]  Metoprolol Succinate 25 MG CS24 Take by mouth daily.    [provider]  Multiple Vitamins-Minerals (CENTRUM ADULTS PO) Take by mouth daily.    [provider]  omeprazole (PRILOSEC) 40 MG capsule Take 40 mg by mouth 2 (two) times daily.    [provider]  ondansetron (ZOFRAN ODT) 4 MG disintegrating tablet Take 1 tablet (4 mg total) by mouth every 8 (eight) hours as needed for nausea or vomiting. 06/26/21   Duffy Bruce, MD    Allergies Ivp dye [iodinated diagnostic agents], Ciprofloxacin, Codeine sulfate [codeine], Meloxicam, Penicillins, Protonix [pantoprazole sodium], Shellfish allergy, and Sulfa antibiotics  Family History  Problem  Relation Age of Onset   Hypertension Mother    Stroke Mother    Epilepsy Mother    Colon polyps Sister    Breast cancer Other     Social History Social History   Tobacco Use   Smoking status: Former   Smokeless tobacco: Never  Scientific laboratory technician Use: Never used  Substance Use Topics   Alcohol use: No   Drug use: Never    Review of Systems Constitutional: No fever/chills Cardiovascular: Denies chest pain. Respiratory: Denies shortness of breath. Gastrointestinal: See HPI Genitourinary: Negative for dysuria. Musculoskeletal: Negative for back pain though when the  pain is present sometimes it radiates towards her mid back. Neurological: Negative for weakness or numbness.    ____________________________________________   PHYSICAL EXAM:  VITAL SIGNS: ED Triage Vitals  Enc Vitals Group     BP 07/29/21 1140 (!) 135/101     Pulse Rate 07/29/21 1140 69     Resp 07/29/21 1140 20     Temp 07/29/21 1140 97.8 F (36.6 C)     Temp Source 07/29/21 1140 Oral     SpO2 07/29/21 1140 100 %     Weight 07/29/21 1137 233 lb 0.4 oz (105.7 kg)     Height 07/29/21 1137 5\' 8"  (1.727 m)     Head Circumference --      Peak Flow --      Pain Score 07/29/21 1137 9     Pain Loc --      Pain Edu? --      Excl. in [place]? --     Constitutional: Alert and oriented. Well appearing and in no acute distress. Eyes: Conjunctivae are normal. Head: Atraumatic. Nose: No congestion/rhinnorhea. Mouth/Throat: Mucous membranes are moist. Neck: No stridor.  Cardiovascular: Normal rate, regular rhythm. Grossly normal heart sounds.  Good peripheral circulation. Respiratory: Normal respiratory effort.  No retractions. Lungs CTAB. Gastrointestinal: Soft and ports tenderness in epigastrium without rebound or guarding. No distention.  No lower abdominal discomfort bilateral Musculoskeletal: No lower extremity tenderness nor edema. Neurologic:  Normal speech and language. No gross focal neurologic deficits are appreciated.  Skin:  Skin is warm, dry and intact. No rash noted. Psychiatric: Mood and affect are normal. Speech and behavior are normal.  ____________________________________________   LABS (all labs ordered are listed, but only abnormal results are displayed)  Labs Reviewed  COMPREHENSIVE METABOLIC PANEL - Abnormal; Notable for the following components:      Result Value   Potassium 3.2 (*)    Glucose, Bld 114 (*)    Creatinine, Ser 1.08 (*)    Alkaline Phosphatase 34 (*)    GFR, Estimated 56 (*)    All other components within normal limits  CBC - Abnormal;  Notable for the following components:   Hemoglobin 10.2 (*)    HCT 31.9 (*)    MCV 77.8 (*)    MCH 24.9 (*)    Platelets 433 (*)    All other components within normal limits  LIPASE, BLOOD  TROPONIN I (HIGH SENSITIVITY)  TROPONIN I (HIGH SENSITIVITY)   ____________________________________________  EKG  ED ECG REPORT I, Delman Kitten, the attending physician, personally viewed and interpreted this ECG.  Date: 08/15/2021 EKG Time: 1240 Rate: 70 Rhythm: normal sinus rhythm QRS Axis: normal Intervals: normal ST/T Wave abnormalities: normal Narrative Interpretation: no evidence of acute ischemia  ____________________________________________  RADIOLOGY  Discussed with the patient and she does not wish to undergo repeat radiographic study at this time.  We did discuss that it somewhat difficult to exclude a worsening process or new process such as obstruction blockage etc., but the patient reports she is already had a CT scan for this she is seeing gastroenterology for and she really just wants to find some pain relief from her symptomatology.  She does not wish to go for repeat imaging study as the symptoms come and go and have been the same for a month and has already had a CT scan. ____________________________________________   PROCEDURES  Procedure(s) performed: None  Procedures  Critical Care performed: No  ____________________________________________   INITIAL IMPRESSION / ASSESSMENT AND PLAN / ED COURSE  Pertinent labs & imaging results that were available during my care of the patient were reviewed by me and considered in my medical decision making (see chart for details).   Shared medical decision making, patient declined repeat imaging.  We will trial tramadol and she reports this actually provides some relief of her discomfort.  She is on sucralfate following with gastroenterology.  Her work appears very reassuring, including cardiac work-up with normal EKG and  troponins.  Symptoms do not seem inherently cardiac in nature.  Her labs show creatinine that is up appropriate, and improved from previous.  She is able to eat and drink does not appear acutely dehydrated.  Patient does not wish to undergo repeat imaging but rather would like to continue to follow-up outpatient with gastroenterology specialist.  Given this, with shared medical decision making root reassurance of improvement in her symptomatology with current treatment I think this is reasonable.  At this point, will discharge, seemingly some type of chronic gastrointestinal illness is suspected or gastritis.  Return precautions and treatment recommendations and follow-up discussed with the patient who is agreeable with the plan.       ____________________________________________   FINAL CLINICAL IMPRESSION(S) / ED DIAGNOSES  Final diagnoses:  Chronic gastritis without bleeding, unspecified gastritis type        Note:  This document was prepared using Dragon voice recognition software and may include unintentional dictation errors       Delman Kitten, MD 08/15/21 1928

## 2021-08-23 ENCOUNTER — Emergency Department
Admission: EM | Admit: 2021-08-23 | Discharge: 2021-08-23 | Discharge status: Against Medical Advice | Payer: Medicare PPO | Source: Home / Self Care

## 2021-08-25 ENCOUNTER — Emergency Department
Admission: EM | Admit: 2021-08-25 | Discharge: 2021-08-25 | Disposition: A | Discharge status: Home / Self Care | Payer: Medicare PPO | Attending: Emergency Medicine | Admitting: Emergency Medicine

## 2021-08-25 ENCOUNTER — Emergency Department: Payer: Medicare PPO

## 2021-08-25 ENCOUNTER — Other Ambulatory Visit: Payer: Self-pay

## 2021-08-25 DIAGNOSIS — R112 Nausea with vomiting, unspecified: Secondary | ICD-10-CM | POA: Diagnosis present

## 2021-08-25 DIAGNOSIS — Z7982 Long term (current) use of aspirin: Secondary | ICD-10-CM | POA: Insufficient documentation

## 2021-08-25 DIAGNOSIS — Z86018 Personal history of other benign neoplasm: Secondary | ICD-10-CM | POA: Diagnosis not present

## 2021-08-25 DIAGNOSIS — Z7951 Long term (current) use of inhaled steroids: Secondary | ICD-10-CM | POA: Diagnosis not present

## 2021-08-25 DIAGNOSIS — E876 Hypokalemia: Secondary | ICD-10-CM | POA: Insufficient documentation

## 2021-08-25 DIAGNOSIS — R1013 Epigastric pain: Secondary | ICD-10-CM | POA: Diagnosis not present

## 2021-08-25 DIAGNOSIS — Z79899 Other long term (current) drug therapy: Secondary | ICD-10-CM | POA: Diagnosis not present

## 2021-08-25 DIAGNOSIS — I10 Essential (primary) hypertension: Secondary | ICD-10-CM | POA: Insufficient documentation

## 2021-08-25 DIAGNOSIS — R1110 Vomiting, unspecified: Secondary | ICD-10-CM

## 2021-08-25 DIAGNOSIS — R197 Diarrhea, unspecified: Secondary | ICD-10-CM

## 2021-08-25 LAB — CBC WITH DIFFERENTIAL/PLATELET
Abs Immature Granulocytes: 0.04 10*3/uL (ref 0.00–0.07)
Basophils Absolute: 0 10*3/uL (ref 0.0–0.1)
Basophils Relative: 0 %
Eosinophils Absolute: 0 10*3/uL (ref 0.0–0.5)
Eosinophils Relative: 0 %
HCT: 30.4 % — ABNORMAL LOW (ref 36.0–46.0)
Hemoglobin: 9.8 g/dL — ABNORMAL LOW (ref 12.0–15.0)
Immature Granulocytes: 1 %
Lymphocytes Relative: 15 %
Lymphs Abs: 1.2 10*3/uL (ref 0.7–4.0)
MCH: 24.6 pg — ABNORMAL LOW (ref 26.0–34.0)
MCHC: 32.2 g/dL (ref 30.0–36.0)
MCV: 76.2 fL — ABNORMAL LOW (ref 80.0–100.0)
Monocytes Absolute: 0.8 10*3/uL (ref 0.1–1.0)
Monocytes Relative: 11 %
Neutro Abs: 5.6 10*3/uL (ref 1.7–7.7)
Neutrophils Relative %: 73 %
Platelets: 333 10*3/uL (ref 150–400)
RBC: 3.99 MIL/uL (ref 3.87–5.11)
RDW: 15.1 % (ref 11.5–15.5)
WBC: 7.6 10*3/uL (ref 4.0–10.5)
nRBC: 0 % (ref 0.0–0.2)

## 2021-08-25 LAB — COMPREHENSIVE METABOLIC PANEL
ALT: 30 U/L (ref 0–44)
AST: 30 U/L (ref 15–41)
Albumin: 3.7 g/dL (ref 3.5–5.0)
Alkaline Phosphatase: 34 U/L — ABNORMAL LOW (ref 38–126)
Anion gap: 8 (ref 5–15)
BUN: 19 mg/dL (ref 8–23)
CO2: 25 mmol/L (ref 22–32)
Calcium: 9.2 mg/dL (ref 8.9–10.3)
Chloride: 99 mmol/L (ref 98–111)
Creatinine, Ser: 1.33 mg/dL — ABNORMAL HIGH (ref 0.44–1.00)
GFR, Estimated: 44 mL/min — ABNORMAL LOW (ref >60)
Glucose, Bld: 114 mg/dL — ABNORMAL HIGH (ref 70–99)
Potassium: 2.7 mmol/L — CL (ref 3.5–5.1)
Sodium: 132 mmol/L — ABNORMAL LOW (ref 135–145)
Total Bilirubin: 0.7 mg/dL (ref 0.3–1.2)
Total Protein: 8 g/dL (ref 6.5–8.1)

## 2021-08-25 LAB — URINALYSIS, ROUTINE W REFLEX MICROSCOPIC
Bilirubin Urine: NEGATIVE
Glucose, UA: NEGATIVE mg/dL
Hgb urine dipstick: NEGATIVE
Ketones, ur: 15 mg/dL — AB
Leukocytes,Ua: NEGATIVE
Nitrite: NEGATIVE
Protein, ur: 30 mg/dL — AB
Specific Gravity, Urine: 1.02 (ref 1.005–1.030)
pH: 6.5 (ref 5.0–8.0)

## 2021-08-25 LAB — LACTIC ACID, PLASMA
Lactic Acid, Venous: 1.2 mmol/L (ref 0.5–1.9)
Lactic Acid, Venous: 1.7 mmol/L (ref 0.5–1.9)

## 2021-08-25 LAB — LIPASE, BLOOD: Lipase: 54 U/L — ABNORMAL HIGH (ref 11–51)

## 2021-08-25 MED ORDER — POTASSIUM CHLORIDE CRYS ER 20 MEQ PO TBCR
60.0000 meq | EXTENDED_RELEASE_TABLET | Freq: Once | ORAL | Status: AC
  Administered 2021-08-25: 60 meq via ORAL

## 2021-08-25 MED ORDER — ONDANSETRON 4 MG PO TBDP
8.0000 mg | ORAL_TABLET | Freq: Once | ORAL | Status: AC
  Administered 2021-08-25: 8 mg via ORAL

## 2021-08-25 MED ORDER — ONDANSETRON HCL 4 MG PO TABS: 4.0000 mg | ORAL_TABLET | Freq: Every day | ORAL | 1 refills | Status: AC | PRN

## 2021-08-25 MED ORDER — OXYCODONE-ACETAMINOPHEN 5-325 MG PO TABS
2.0000 | ORAL_TABLET | Freq: Once | ORAL | Status: AC
  Administered 2021-08-25: 2 via ORAL

## 2021-08-25 MED ORDER — POTASSIUM CHLORIDE CRYS ER 20 MEQ PO TBCR: 20.0000 meq | EXTENDED_RELEASE_TABLET | Freq: Two times a day (BID) | ORAL | 0 refills | Status: DC

## 2021-08-25 MED ORDER — SODIUM CHLORIDE 0.9 % IV BOLUS
500.0000 mL | Freq: Once | INTRAVENOUS | Status: AC
  Administered 2021-08-25: 500 mL via INTRAVENOUS

## 2021-08-25 MED ORDER — METOCLOPRAMIDE HCL 10 MG PO TABS
10.0000 mg | ORAL_TABLET | Freq: Once | ORAL | Status: AC
  Administered 2021-08-25: 10 mg via ORAL

## 2021-08-25 NOTE — ED Notes (Signed)
Attempted IV twice without success  

## 2021-08-25 NOTE — ED Provider Notes (Signed)
[facility]  ____________________________________________   Event Date/Time   First MD Initiated Contact with Patient 08/25/21 1732     (approximate)  I have reviewed the triage vital signs and the nursing notes.   HISTORY  Chief Complaint Emesis    HPI Teresa Sparks is a 70 y.o. female with past medical history of GERD, chronic abdominal pain, hiatal hernia, hypertension who presents with nausea vomiting and diarrhea.  Patient symptoms started about 5 days ago.  She endorses epigastric discomfort as well as multiple episodes of emesis and diarrhea as well as nausea.  She was seen at emergency department 2 days ago was found to be hypokalemic with a potassium of 3 but did not want to have a CAT scan and refused potassium supplementation.  She then followed up with her doctor today who told her to come to the emergency department.  Patient has not had any vomiting or diarrhea since exam this morning is overall feeling improved.  She is hungry at this time.  He does endorse some ongoing epigastric discomfort described as burning.  No fevers or chills no urinary symptoms.         Past Medical History:  Diagnosis Date   Adenomatous colon polyp    Adrenal adenoma    Allergic genetic state    Arthritis    Chronic abdominal pain    GERD (gastroesophageal reflux disease)    Hiatal hernia    Hypertension     There are no problems to display for this patient.   Past Surgical History:  Procedure Laterality Date   ABDOMINAL HYSTERECTOMY     CHOLECYSTECTOMY     COLONOSCOPY     COLONOSCOPY WITH PROPOFOL N/A 07/24/2018   Procedure: COLONOSCOPY WITH PROPOFOL;  Surgeon: Manya Silvas, MD;  Location: [facility] ENDOSCOPY;  Service: Endoscopy;  Laterality: N/A;   FRACTURE SURGERY     JOINT REPLACEMENT     6 years ago    Prior to Admission medications   Medication Sig Start Date End Date Taking? Authorizing Provider  ondansetron (ZOFRAN) 4 MG tablet Take  1 tablet (4 mg total) by mouth daily as needed for nausea or vomiting. 08/25/21 08/25/22 Yes Rada Hay, MD  potassium chloride SA (KLOR-CON M) 20 MEQ tablet Take 1 tablet (20 mEq total) by mouth 2 (two) times daily for 5 days. 08/25/21 08/30/21 Yes Rada Hay, MD  aspirin 81 MG chewable tablet Chew by mouth daily.    [provider]  cetirizine (ZYRTEC) 10 MG tablet Take 10 mg by mouth daily.    [provider]  DULoxetine (CYMBALTA) 30 MG capsule Take 30 mg by mouth daily.    [provider]  famotidine (PEPCID) 20 MG tablet Take 1 tablet (20 mg total) by mouth daily for 10 days. 06/26/21 07/06/21  Duffy Bruce, MD  fluticasone (FLONASE) 50 MCG/ACT nasal spray Place into both nostrils daily.    [provider]  gabapentin (NEURONTIN) 300 MG capsule Take 300 mg by mouth daily.    [provider]  hydrALAZINE (APRESOLINE) 50 MG tablet Take 50 mg by mouth 2 (two) times daily.    [provider]  Metoprolol Succinate 25 MG CS24 Take by mouth daily.    [provider]  Multiple Vitamins-Minerals (CENTRUM ADULTS PO) Take by mouth daily.    [provider]  omeprazole (PRILOSEC) 40 MG capsule Take 40 mg by mouth 2 (two) times daily.    [provider]  ondansetron (ZOFRAN ODT) 4 MG disintegrating tablet Take 1 tablet (4 mg total) by mouth every 8 (eight) hours as needed for nausea or vomiting. 06/26/21   Duffy Bruce, MD  traMADol (ULTRAM) 50 MG tablet Take 1 tablet (50 mg total) by mouth every 6 (six) hours as needed for severe pain. 07/29/21   Delman Kitten, MD    Allergies Ivp dye [iodinated diagnostic agents], Ciprofloxacin, Codeine sulfate [codeine], Meloxicam, Penicillins, Protonix [pantoprazole sodium], Shellfish allergy, and Sulfa antibiotics  Family History  Problem Relation Age of Onset   Hypertension Mother    Stroke Mother    Epilepsy Mother    Colon polyps Sister    Breast cancer Other      Social History Social History   Tobacco Use   Smoking status: Former   Smokeless tobacco: Never  Scientific laboratory technician Use: Never used  Substance Use Topics   Alcohol use: No   Drug use: Never    Review of Systems   Review of Systems  Constitutional:  Positive for appetite change. Negative for chills and fever.  Respiratory:  Negative for shortness of breath.   Cardiovascular:  Negative for chest pain.  Gastrointestinal:  Positive for abdominal pain, diarrhea, nausea and vomiting.  Genitourinary:  Negative for dysuria.  All other systems reviewed and are negative.  Physical Exam Updated Vital Signs BP 126/61 (BP Location: Left Arm)   Pulse 81   Temp 98.6 F (37 C) (Oral)   Resp 17   Ht 5\' 8"  (1.727 m)   Wt 105.7 kg   SpO2 100%   BMI 35.43 kg/m   Physical Exam Vitals and nursing note reviewed.  Constitutional:      General: She is not in acute distress.    Appearance: Normal appearance.  HENT:     Head: Normocephalic and atraumatic.  Eyes:     General: No scleral icterus.    Conjunctiva/sclera: Conjunctivae normal.  Pulmonary:     Effort: Pulmonary effort is normal. No respiratory distress.     Breath sounds: No stridor.  Abdominal:     General: Abdomen is flat.     Palpations: Abdomen is soft.     Tenderness: There is abdominal tenderness.     Comments: Tenderness to palpation in the epigastric region without guarding  Musculoskeletal:        General: No deformity or signs of injury.     Cervical back: Normal range of motion.  Skin:    General: Skin is dry.     Coloration: Skin is not jaundiced or pale.  Neurological:     General: No focal deficit present.     Mental Status: She is alert and oriented to person, place, and time. Mental status is at baseline.  Psychiatric:        Mood and Affect: Mood normal.        Behavior: Behavior normal.     LABS (all labs ordered are listed, but only abnormal results are displayed)  Labs Reviewed   COMPREHENSIVE METABOLIC PANEL - Abnormal; Notable for the following components:      Result Value   Sodium 132 (*)    Potassium 2.7 (*)    Glucose, Bld 114 (*)    Creatinine, Ser 1.33 (*)    Alkaline Phosphatase 34 (*)    GFR, Estimated 44 (*)    All other components within normal limits  LIPASE, BLOOD - Abnormal; Notable for the following components:   Lipase 54 (*)  All other components within normal limits  CBC WITH DIFFERENTIAL/PLATELET - Abnormal; Notable for the following components:   Hemoglobin 9.8 (*)    HCT 30.4 (*)    MCV 76.2 (*)    MCH 24.6 (*)    All other components within normal limits  URINALYSIS, ROUTINE W REFLEX MICROSCOPIC - Abnormal; Notable for the following components:   Color, Urine YELLOW (*)    APPearance CLEAR (*)    Ketones, ur 15 (*)    Protein, ur 30 (*)    Bacteria, UA FEW (*)    All other components within normal limits  LACTIC ACID, PLASMA  LACTIC ACID, PLASMA   ____________________________________________  EKG  Normal sinus rhythm, normal axis, inverted T wave in V2 and V3 with some mild ST depression throughout ____________________________________________  RADIOLOGY I, Madelin Headings, personally viewed and evaluated these images (plain radiographs) as part of my medical decision making, as well as reviewing the written report by the radiologist.  ED MD interpretation: I reviewed the CT abdomen pelvis without contrast was does not show any acute pathology    ____________________________________________   PROCEDURES  Procedure(s) performed (including Critical Care):  Procedures   ____________________________________________   INITIAL IMPRESSION / ASSESSMENT AND PLAN / ED COURSE     Patient is a 70 year old female presenting with several days of nausea vomiting and diarrhea.  Vital signs within normal limits.  She is well-appearing.  Does have some abdominal tenderness on exam but overall abdomen is benign.  Labs from  triage are notable for hypokalemia with a potassium of 2.7, no significant AKI or leukocytosis.  CT abdomen pelvis without contrast was also obtained from triage which is negative for any acute process.  Patient has had a cholecystectomy so unlikely to be biliary.  My evaluation patient is now hungry and requesting food and drink.  She does not want to stay for IV potassium and would like to go home with p.o. supplementation.  Does not want any ongoing work-up.  Her EKG here does have some nonspecific ST changes which I suspect is in the setting of her hyperkalemia, she has no chest pain.  Will discharge with p.o. potassium supplementation as well as Zofran.  I discussed the importance of returning to the ED if she is unable to tolerate the potassium supplementation given the risks of arrhythmias with any ongoing lowering of her potassium.  She was agreeable and understood.  Also advised her to have a repeat BMP in 1 week.      ____________________________________________   FINAL CLINICAL IMPRESSION(S) / ED DIAGNOSES  Final diagnoses:  Hypokalemia  Vomiting and diarrhea     ED Discharge Orders          Ordered    potassium chloride SA (KLOR-CON M) 20 MEQ tablet  2 times daily        08/25/21 1856    ondansetron (ZOFRAN) 4 MG tablet  Daily PRN        08/25/21 1856             Note:  This document was prepared using Dragon voice recognition software and may include unintentional dictation errors.    Rada Hay, MD 08/25/21 1901

## 2021-08-25 NOTE — ED Provider Notes (Signed)
  Emergency Medicine Provider Triage Evaluation Note  Teresa Sparks , a 70 y.o.female,  was evaluated in triage.  Pt complains of vomiting, diarrhea, abdominal pain.  Patient states that her symptoms started about 4 days prior.  She was seen in ED on Sunday she received COVID and flu testing, which was negative.  Patient continues to have multiple episodes of vomiting and diarrhea, as well as fevers reported at home (highest fever 103.1).  Abdominal pain is mostly in her right upper quadrant, describes as sharp pain, rates as 13/10.   Review of Systems  Positive: Abdominal pain, vomiting, diarrhea Negative: Chest pain, shortness of breath.  Physical Exam   Vitals:   08/25/21 1216  BP: (!) 118/58  Pulse: 85  Resp: 16  Temp: 98.3 F (36.8 C)  SpO2: 100%   Gen:   Awake, appears uncomfortable Resp:  Labored breathing. MSK:   Moves extremities without difficulty  Other:  Significant tenderness with light palpation of the abdomen in the upper right and lower right quadrants.  Medical Decision Making  Given the patient's initial medical screening exam, the following diagnostic evaluation has been ordered. The patient will be placed in the appropriate treatment space, once one is available, to complete the evaluation and treatment. I have discussed the plan of care with the patient and I have advised the patient that an ED physician or mid-level practitioner will reevaluate their condition after the test results have been received, as the results may give them additional insight into the type of treatment they may need.    Diagnostics: Labs, UA, abdominal CT.  Treatments: none immediately   Teodoro Spray, [place] 08/25/21 1223    Vladimir Crofts, MD 08/25/21 1309

## 2021-08-25 NOTE — Discharge Instructions (Addendum)
Your potassium levels were low likely from the diarrhea and vomiting.  Please take the potassium supplementation twice daily for the next 5 days.  Can take the Zofran for vomiting.  Please have her follow-up for repeat blood draw in about 1 week to make sure your potassium has normalized.  If you are unable to tolerate the potassium supplementation and have ongoing vomiting, please return to the emergency department.

## 2021-08-25 NOTE — ED Triage Notes (Signed)
Pt to ED reporting vomiting and diarrhea since Saturday with right sided abd pain. Pt was seen at ED on Sunday with COVID and flu testing that was negative. Pt has continued to have persistent vomiting and diarrhea with a reported 7 episodes of diarrhea last night. Intermittent fevers reported at home. Highest fever 103.1.

## 2021-08-25 NOTE — ED Notes (Signed)
Potassium 2.7

## 2021-10-20 ENCOUNTER — Other Ambulatory Visit: Payer: Self-pay | Admitting: Family Medicine

## 2021-10-20 DIAGNOSIS — Z1231 Encounter for screening mammogram for malignant neoplasm of breast: Secondary | ICD-10-CM

## 2021-11-25 ENCOUNTER — Ambulatory Visit
Admission: RE | Admit: 2021-11-25 | Discharge: 2021-11-25 | Disposition: A | Discharge status: Home / Self Care | Payer: Medicare PPO | Source: Ambulatory Visit | Attending: Family Medicine | Admitting: Family Medicine

## 2021-11-25 ENCOUNTER — Other Ambulatory Visit: Payer: Self-pay

## 2021-11-25 DIAGNOSIS — Z1231 Encounter for screening mammogram for malignant neoplasm of breast: Secondary | ICD-10-CM | POA: Insufficient documentation

## 2022-02-04 ENCOUNTER — Emergency Department: Payer: 59

## 2022-02-04 ENCOUNTER — Inpatient Hospital Stay
Admission: EM | Admit: 2022-02-04 | Discharge: 2022-02-06 | DRG: 253 | Disposition: A | Discharge status: Home / Self Care | Payer: 59 | Attending: Student | Admitting: Student

## 2022-02-04 ENCOUNTER — Encounter: Payer: Self-pay | Admitting: Emergency Medicine

## 2022-02-04 ENCOUNTER — Other Ambulatory Visit: Payer: Self-pay

## 2022-02-04 DIAGNOSIS — Z91041 Radiographic dye allergy status: Secondary | ICD-10-CM | POA: Diagnosis not present

## 2022-02-04 DIAGNOSIS — Z881 Allergy status to other antibiotic agents status: Secondary | ICD-10-CM

## 2022-02-04 DIAGNOSIS — G8929 Other chronic pain: Secondary | ICD-10-CM | POA: Diagnosis present

## 2022-02-04 DIAGNOSIS — Z888 Allergy status to other drugs, medicaments and biological substances status: Secondary | ICD-10-CM | POA: Diagnosis not present

## 2022-02-04 DIAGNOSIS — Z882 Allergy status to sulfonamides status: Secondary | ICD-10-CM | POA: Diagnosis not present

## 2022-02-04 DIAGNOSIS — R911 Solitary pulmonary nodule: Secondary | ICD-10-CM | POA: Diagnosis present

## 2022-02-04 DIAGNOSIS — I10 Essential (primary) hypertension: Secondary | ICD-10-CM | POA: Diagnosis present

## 2022-02-04 DIAGNOSIS — I161 Hypertensive emergency: Secondary | ICD-10-CM | POA: Diagnosis present

## 2022-02-04 DIAGNOSIS — F32A Depression, unspecified: Secondary | ICD-10-CM | POA: Diagnosis present

## 2022-02-04 DIAGNOSIS — E669 Obesity, unspecified: Secondary | ICD-10-CM | POA: Diagnosis present

## 2022-02-04 DIAGNOSIS — Z8371 Family history of colonic polyps: Secondary | ICD-10-CM

## 2022-02-04 DIAGNOSIS — Z9049 Acquired absence of other specified parts of digestive tract: Secondary | ICD-10-CM | POA: Diagnosis not present

## 2022-02-04 DIAGNOSIS — K551 Chronic vascular disorders of intestine: Secondary | ICD-10-CM | POA: Diagnosis present

## 2022-02-04 DIAGNOSIS — K219 Gastro-esophageal reflux disease without esophagitis: Secondary | ICD-10-CM | POA: Diagnosis present

## 2022-02-04 DIAGNOSIS — I774 Celiac artery compression syndrome: Secondary | ICD-10-CM | POA: Diagnosis present

## 2022-02-04 DIAGNOSIS — Z6833 Body mass index (BMI) 33.0-33.9, adult: Secondary | ICD-10-CM | POA: Diagnosis not present

## 2022-02-04 DIAGNOSIS — Z87891 Personal history of nicotine dependence: Secondary | ICD-10-CM

## 2022-02-04 DIAGNOSIS — Z79899 Other long term (current) drug therapy: Secondary | ICD-10-CM

## 2022-02-04 DIAGNOSIS — M1990 Unspecified osteoarthritis, unspecified site: Secondary | ICD-10-CM | POA: Diagnosis present

## 2022-02-04 DIAGNOSIS — I771 Stricture of artery: Principal | ICD-10-CM | POA: Diagnosis present

## 2022-02-04 DIAGNOSIS — R079 Chest pain, unspecified: Secondary | ICD-10-CM

## 2022-02-04 DIAGNOSIS — Z8249 Family history of ischemic heart disease and other diseases of the circulatory system: Secondary | ICD-10-CM

## 2022-02-04 DIAGNOSIS — Z91013 Allergy to seafood: Secondary | ICD-10-CM

## 2022-02-04 DIAGNOSIS — Z90710 Acquired absence of both cervix and uterus: Secondary | ICD-10-CM | POA: Diagnosis not present

## 2022-02-04 DIAGNOSIS — D509 Iron deficiency anemia, unspecified: Secondary | ICD-10-CM | POA: Diagnosis present

## 2022-02-04 DIAGNOSIS — Z86010 Personal history of colonic polyps: Secondary | ICD-10-CM | POA: Diagnosis not present

## 2022-02-04 DIAGNOSIS — Z886 Allergy status to analgesic agent status: Secondary | ICD-10-CM

## 2022-02-04 DIAGNOSIS — R1013 Epigastric pain: Secondary | ICD-10-CM | POA: Diagnosis present

## 2022-02-04 DIAGNOSIS — Z7982 Long term (current) use of aspirin: Secondary | ICD-10-CM

## 2022-02-04 DIAGNOSIS — Z880 Allergy status to penicillin: Secondary | ICD-10-CM

## 2022-02-04 DIAGNOSIS — R1084 Generalized abdominal pain: Principal | ICD-10-CM

## 2022-02-04 DIAGNOSIS — Z885 Allergy status to narcotic agent status: Secondary | ICD-10-CM | POA: Diagnosis not present

## 2022-02-04 LAB — COMPREHENSIVE METABOLIC PANEL
ALT: 16 U/L (ref 0–44)
AST: 19 U/L (ref 15–41)
Albumin: 4.1 g/dL (ref 3.5–5.0)
Alkaline Phosphatase: 41 U/L (ref 38–126)
Anion gap: 7 (ref 5–15)
BUN: 15 mg/dL (ref 8–23)
CO2: 25 mmol/L (ref 22–32)
Calcium: 9.8 mg/dL (ref 8.9–10.3)
Chloride: 107 mmol/L (ref 98–111)
Creatinine, Ser: 0.94 mg/dL (ref 0.44–1.00)
GFR, Estimated: >60 mL/min (ref >60)
Glucose, Bld: 122 mg/dL — ABNORMAL HIGH (ref 70–99)
Potassium: 4.1 mmol/L (ref 3.5–5.1)
Sodium: 139 mmol/L (ref 135–145)
Total Bilirubin: 0.5 mg/dL (ref 0.3–1.2)
Total Protein: 7.5 g/dL (ref 6.5–8.1)

## 2022-02-04 LAB — CBC WITH DIFFERENTIAL/PLATELET
Abs Immature Granulocytes: 0.06 10*3/uL (ref 0.00–0.07)
Basophils Absolute: 0.1 10*3/uL (ref 0.0–0.1)
Basophils Relative: 1 %
Eosinophils Absolute: 0.1 10*3/uL (ref 0.0–0.5)
Eosinophils Relative: 1 %
HCT: 35.8 % — ABNORMAL LOW (ref 36.0–46.0)
Hemoglobin: 10.9 g/dL — ABNORMAL LOW (ref 12.0–15.0)
Immature Granulocytes: 1 %
Lymphocytes Relative: 18 %
Lymphs Abs: 1.7 10*3/uL (ref 0.7–4.0)
MCH: 23.3 pg — ABNORMAL LOW (ref 26.0–34.0)
MCHC: 30.4 g/dL (ref 30.0–36.0)
MCV: 76.5 fL — ABNORMAL LOW (ref 80.0–100.0)
Monocytes Absolute: 0.5 10*3/uL (ref 0.1–1.0)
Monocytes Relative: 5 %
Neutro Abs: 7.2 10*3/uL (ref 1.7–7.7)
Neutrophils Relative %: 74 %
Platelets: 355 10*3/uL (ref 150–400)
RBC: 4.68 MIL/uL (ref 3.87–5.11)
RDW: 16.4 % — ABNORMAL HIGH (ref 11.5–15.5)
WBC: 9.6 10*3/uL (ref 4.0–10.5)
nRBC: 0 % (ref 0.0–0.2)

## 2022-02-04 LAB — TROPONIN I (HIGH SENSITIVITY)
Troponin I (High Sensitivity): 4 ng/L (ref <18)
Troponin I (High Sensitivity): <2 ng/L (ref <18)

## 2022-02-04 LAB — LIPASE, BLOOD: Lipase: 26 U/L (ref 11–51)

## 2022-02-04 MED ORDER — FLUTICASONE PROPIONATE 50 MCG/ACT NA SUSP
1.0000 | Freq: Every day | NASAL | Status: DC
  Filled 2022-02-04: qty 16

## 2022-02-04 MED ORDER — SODIUM CHLORIDE 0.9% FLUSH: 3.0000 mL | INTRAVENOUS | Status: DC | PRN

## 2022-02-04 MED ORDER — LISINOPRIL-HYDROCHLOROTHIAZIDE 10-12.5 MG PO TABS: 1.0000 | ORAL_TABLET | Freq: Every day | ORAL | Status: DC

## 2022-02-04 MED ORDER — LORATADINE 10 MG PO TABS
10.0000 mg | ORAL_TABLET | Freq: Every day | ORAL | Status: DC
  Administered 2022-02-06: 10 mg via ORAL
  Filled 2022-02-04: qty 1

## 2022-02-04 MED ORDER — BISACODYL 5 MG PO TBEC: 5.0000 mg | DELAYED_RELEASE_TABLET | Freq: Every day | ORAL | Status: DC | PRN

## 2022-02-04 MED ORDER — ONDANSETRON HCL 4 MG/2ML IJ SOLN: 4.0000 mg | Freq: Four times a day (QID) | INTRAMUSCULAR | Status: DC | PRN

## 2022-02-04 MED ORDER — GABAPENTIN 300 MG PO CAPS
300.0000 mg | ORAL_CAPSULE | Freq: Two times a day (BID) | ORAL | Status: DC
  Administered 2022-02-04 – 2022-02-06 (×3): 300 mg via ORAL
  Filled 2022-02-04 (×3): qty 1

## 2022-02-04 MED ORDER — HYDROCHLOROTHIAZIDE 12.5 MG PO TABS
12.5000 mg | ORAL_TABLET | Freq: Every day | ORAL | Status: DC
  Administered 2022-02-04: 12.5 mg via ORAL
  Filled 2022-02-04: qty 1

## 2022-02-04 MED ORDER — LIDOCAINE VISCOUS HCL 2 % MT SOLN
15.0000 mL | Freq: Once | OROMUCOSAL | Status: AC
Start: 2022-02-04 — End: 2022-02-04
  Administered 2022-02-04: 15 mL via ORAL
  Filled 2022-02-04: qty 15

## 2022-02-04 MED ORDER — IOHEXOL 350 MG/ML SOLN
100.0000 mL | Freq: Once | INTRAVENOUS | Status: AC | PRN
  Administered 2022-02-04: 100 mL via INTRAVENOUS

## 2022-02-04 MED ORDER — ADULT MULTIVITAMIN W/MINERALS CH
1.0000 | ORAL_TABLET | Freq: Every day | ORAL | Status: DC
  Administered 2022-02-06: 1 via ORAL
  Filled 2022-02-04: qty 1

## 2022-02-04 MED ORDER — POLYETHYLENE GLYCOL 3350 17 G PO PACK: 17.0000 g | PACK | Freq: Every day | ORAL | Status: DC | PRN

## 2022-02-04 MED ORDER — PANTOPRAZOLE SODIUM 40 MG PO TBEC: 40.0000 mg | DELAYED_RELEASE_TABLET | Freq: Every day | ORAL | Status: DC

## 2022-02-04 MED ORDER — COLCHICINE 0.6 MG PO TABS
0.6000 mg | ORAL_TABLET | Freq: Every day | ORAL | Status: DC
  Filled 2022-02-04 (×3): qty 1

## 2022-02-04 MED ORDER — PAROXETINE HCL 10 MG PO TABS
10.0000 mg | ORAL_TABLET | Freq: Every day | ORAL | Status: DC
  Administered 2022-02-06: 10 mg via ORAL
  Filled 2022-02-04 (×2): qty 1

## 2022-02-04 MED ORDER — ENOXAPARIN SODIUM 40 MG/0.4ML IJ SOSY: 40.0000 mg | PREFILLED_SYRINGE | INTRAMUSCULAR | Status: DC

## 2022-02-04 MED ORDER — DIPHENHYDRAMINE HCL 25 MG PO CAPS: 50.0000 mg | ORAL_CAPSULE | Freq: Once | ORAL | Status: AC

## 2022-02-04 MED ORDER — ONDANSETRON HCL 4 MG PO TABS: 4.0000 mg | ORAL_TABLET | Freq: Four times a day (QID) | ORAL | Status: DC | PRN

## 2022-02-04 MED ORDER — PREDNISONE 20 MG PO TABS
40.0000 mg | ORAL_TABLET | Freq: Once | ORAL | Status: AC
  Administered 2022-02-04: 40 mg via ORAL
  Filled 2022-02-04: qty 2

## 2022-02-04 MED ORDER — ASPIRIN 81 MG PO CHEW
81.0000 mg | CHEWABLE_TABLET | Freq: Every day | ORAL | Status: DC
  Administered 2022-02-04 – 2022-02-06 (×2): 81 mg via ORAL
  Filled 2022-02-04 (×2): qty 1

## 2022-02-04 MED ORDER — METHYLPREDNISOLONE SODIUM SUCC 40 MG IJ SOLR
40.0000 mg | Freq: Once | INTRAMUSCULAR | Status: AC
  Administered 2022-02-04: 40 mg via INTRAVENOUS
  Filled 2022-02-04: qty 1

## 2022-02-04 MED ORDER — METOPROLOL SUCCINATE ER 25 MG PO TB24
25.0000 mg | ORAL_TABLET | Freq: Every day | ORAL | Status: DC
  Administered 2022-02-04 – 2022-02-06 (×2): 25 mg via ORAL
  Filled 2022-02-04 (×3): qty 1

## 2022-02-04 MED ORDER — HYDRALAZINE HCL 50 MG PO TABS
50.0000 mg | ORAL_TABLET | Freq: Two times a day (BID) | ORAL | Status: DC
  Administered 2022-02-04 – 2022-02-06 (×3): 50 mg via ORAL
  Filled 2022-02-04 (×3): qty 1

## 2022-02-04 MED ORDER — ALUM & MAG HYDROXIDE-SIMETH 200-200-20 MG/5ML PO SUSP
30.0000 mL | Freq: Once | ORAL | Status: AC
  Administered 2022-02-04: 30 mL via ORAL
  Filled 2022-02-04: qty 30

## 2022-02-04 MED ORDER — ACETAMINOPHEN 325 MG PO TABS: 650.0000 mg | ORAL_TABLET | Freq: Four times a day (QID) | ORAL | Status: DC | PRN

## 2022-02-04 MED ORDER — LISINOPRIL 10 MG PO TABS
10.0000 mg | ORAL_TABLET | Freq: Every day | ORAL | Status: DC
Start: 2022-02-04 — End: 2022-02-06
  Administered 2022-02-04 – 2022-02-06 (×2): 10 mg via ORAL
  Filled 2022-02-04 (×2): qty 1

## 2022-02-04 MED ORDER — SODIUM CHLORIDE 0.9% FLUSH
3.0000 mL | Freq: Two times a day (BID) | INTRAVENOUS | Status: DC
  Administered 2022-02-04: 3 mL via INTRAVENOUS

## 2022-02-04 MED ORDER — HYDRALAZINE HCL 20 MG/ML IJ SOLN
10.0000 mg | Freq: Once | INTRAMUSCULAR | Status: AC
  Administered 2022-02-04: 10 mg via INTRAVENOUS
  Filled 2022-02-04: qty 1

## 2022-02-04 MED ORDER — HYDRALAZINE HCL 20 MG/ML IJ SOLN
10.0000 mg | Freq: Four times a day (QID) | INTRAMUSCULAR | Status: DC | PRN
  Filled 2022-02-04: qty 1

## 2022-02-04 MED ORDER — DIPHENHYDRAMINE HCL 50 MG/ML IJ SOLN
50.0000 mg | Freq: Once | INTRAMUSCULAR | Status: AC
  Administered 2022-02-04: 50 mg via INTRAVENOUS
  Filled 2022-02-04: qty 1

## 2022-02-04 MED ORDER — ESOMEPRAZOLE MAGNESIUM 20 MG PO CPDR
40.0000 mg | DELAYED_RELEASE_CAPSULE | Freq: Every day | ORAL | Status: DC
  Filled 2022-02-04 (×2): qty 2

## 2022-02-04 MED ORDER — HYDRALAZINE HCL 20 MG/ML IJ SOLN
5.0000 mg | Freq: Once | INTRAMUSCULAR | Status: AC
  Administered 2022-02-04: 5 mg via INTRAVENOUS
  Filled 2022-02-04: qty 1

## 2022-02-04 MED ORDER — ACETAMINOPHEN 650 MG RE SUPP: 650.0000 mg | Freq: Four times a day (QID) | RECTAL | Status: DC | PRN

## 2022-02-04 MED ORDER — DULOXETINE HCL 30 MG PO CPEP
30.0000 mg | ORAL_CAPSULE | Freq: Every day | ORAL | Status: DC
  Administered 2022-02-04 – 2022-02-06 (×2): 30 mg via ORAL
  Filled 2022-02-04 (×3): qty 1

## 2022-02-04 MED ORDER — MORPHINE SULFATE (PF) 2 MG/ML IV SOLN
2.0000 mg | INTRAVENOUS | Status: DC | PRN
  Administered 2022-02-04 – 2022-02-05 (×2): 2 mg via INTRAVENOUS
  Filled 2022-02-04 (×2): qty 1

## 2022-02-04 MED ORDER — SODIUM CHLORIDE 0.9 % IV SOLN: 250.0000 mL | INTRAVENOUS | Status: DC | PRN

## 2022-02-04 NOTE — ED Provider Notes (Signed)
? ?[facility] ?Provider Note ? ? ? Event Date/Time  ? First MD Initiated Contact with Patient 02/04/22 0556   ?  (approximate) ? ? ?History  ? ?Abdominal Pain ? ? ?HPI ? ?Teresa Sparks is a 71 y.o. female with history of hypertension, GERD, hiatal hernia who presents to the emergency department with complaints of pulsating abdominal pain that started in the center of her abdomen that radiated into her chest that woke her from sleep at 3 AM.  States she has had associated shortness of breath, nausea.  No vomiting, diarrhea, fevers, bloody stools, melena, GU symptoms.  She has had previous hysterectomy, cholecystectomy.  States she has been diagnosed with GERD and states she took Carafate prior to arrival without any relief.  She denies any known history of aortic aneurysm or dissection.  She is extremely hypertensive here but reports compliance with her blood pressure medications but states she normally takes them at about 8 AM.  States her gastroenterologist is Dr. Alice Reichert.  She denies taking NSAIDs or drinking alcohol. ? ? ?History provided by patient. ? ? ? ?Past Medical History:  ?Diagnosis Date  ? Adenomatous colon polyp   ? Adrenal adenoma   ? Allergic genetic state   ? Arthritis   ? Chronic abdominal pain   ? GERD (gastroesophageal reflux disease)   ? Hiatal hernia   ? Hypertension   ? ? ?Past Surgical History:  ?Procedure Laterality Date  ? ABDOMINAL HYSTERECTOMY    ? CHOLECYSTECTOMY    ? COLONOSCOPY    ? COLONOSCOPY WITH PROPOFOL N/A 07/24/2018  ? Procedure: COLONOSCOPY WITH PROPOFOL;  Surgeon: Manya Silvas, MD;  Location: [facility] ENDOSCOPY;  Service: Endoscopy;  Laterality: N/A;  ? FRACTURE SURGERY    ? JOINT REPLACEMENT    ? 6 years ago  ? ? ?MEDICATIONS:  ?Prior to Admission medications   ?Medication Sig Start Date End Date Taking? Authorizing Provider  ?aspirin 81 MG chewable tablet Chew by mouth daily.    [provider]  ?cetirizine (ZYRTEC) 10 MG tablet Take 10  mg by mouth daily.    [provider]  ?DULoxetine (CYMBALTA) 30 MG capsule Take 30 mg by mouth daily.    [provider]  ?famotidine (PEPCID) 20 MG tablet Take 1 tablet (20 mg total) by mouth daily for 10 days. 06/26/21 07/06/21  Duffy Bruce, MD  ?fluticasone Asencion Islam) 50 MCG/ACT nasal spray Place into both nostrils daily.    [provider]  ?gabapentin (NEURONTIN) 300 MG capsule Take 300 mg by mouth daily.    [provider]  ?hydrALAZINE (APRESOLINE) 50 MG tablet Take 50 mg by mouth 2 (two) times daily.    [provider]  ?Metoprolol Succinate 25 MG CS24 Take by mouth daily.    [provider]  ?Multiple Vitamins-Minerals (CENTRUM ADULTS PO) Take by mouth daily.    [provider]  ?omeprazole (PRILOSEC) 40 MG capsule Take 40 mg by mouth 2 (two) times daily.    [provider]  ?ondansetron (ZOFRAN ODT) 4 MG disintegrating tablet Take 1 tablet (4 mg total) by mouth every 8 (eight) hours as needed for nausea or vomiting. 06/26/21   Duffy Bruce, MD  ?ondansetron (ZOFRAN) 4 MG tablet Take 1 tablet (4 mg total) by mouth daily as needed for nausea or vomiting. 08/25/21 08/25/22  Rada Hay, MD  ?potassium chloride SA (KLOR-CON M) 20 MEQ tablet Take 1 tablet (20 mEq total) by mouth 2 (two) times daily  for 5 days. 08/25/21 08/30/21  Rada Hay, MD  ?traMADol (ULTRAM) 50 MG tablet Take 1 tablet (50 mg total) by mouth every 6 (six) hours as needed for severe pain. 07/29/21   Delman Kitten, MD  ? ? ?Physical Exam  ? ?Triage Vital Signs: ?ED Triage Vitals [02/04/22 0554]  ?Enc Vitals Group  ?   BP (!) 222/90  ?   Pulse Rate (!) 102  ?   Resp 20  ?   Temp 98.6 ?F (37 ?C)  ?   Temp Source Oral  ?   SpO2 100 %  ?   Weight 230 lb (104.3 kg)  ?   Height '5\' 9"'$  (1.753 m)  ?   Head Circumference   ?   Peak Flow   ?   Pain Score 9  ?   Pain Loc   ?   Pain Edu?   ?   Excl. in [place]?   ? ? ?Most recent vital signs: ?Vitals:  ? 02/04/22 0651  02/04/22 0700  ?BP: (!) 181/72 (!) 156/73  ?Pulse:  83  ?Resp:  13  ?Temp:    ?SpO2:  100%  ? ? ?CONSTITUTIONAL: Alert and oriented and responds appropriately to questions. Well-appearing; well-nourished, obese, appears uncomfortable but not in distress ?HEAD: Normocephalic, atraumatic ?EYES: Conjunctivae clear, pupils appear equal, sclera nonicteric ?ENT: normal nose; moist mucous membranes ?NECK: Supple, normal ROM ?CARD: Regular and tachycardic; S1 and S2 appreciated; no murmurs, no clicks, no rubs, no gallops ?RESP: Normal chest excursion without splinting or tachypnea; breath sounds clear and equal bilaterally; no wheezes, no rhonchi, no rales, no hypoxia or respiratory distress, speaking full sentences ?ABD/GI: Normal bowel sounds; non-distended; soft, non-tender, no rebound, no guarding, no peritoneal signs ?BACK: The back appears normal ?EXT: Normal ROM in all joints; no deformity noted, no edema; no cyanosis, no calf tenderness or calf swelling, extremities warm and well-perfused ?SKIN: Normal color for age and race; warm; no rash on exposed skin ?NEURO: Moves all extremities equally, normal speech, no facial asymmetry ?PSYCH: The patient's mood and manner are appropriate. ? ? ?ED Results / Procedures / Treatments  ? ?LABS: ?(all labs ordered are listed, but only abnormal results are displayed) ?Labs Reviewed  ?CBC WITH DIFFERENTIAL/PLATELET - Abnormal; Notable for the following components:  ?    Result Value  ? Hemoglobin 10.9 (*)   ? HCT 35.8 (*)   ? MCV 76.5 (*)   ? MCH 23.3 (*)   ? RDW 16.4 (*)   ? All other components within normal limits  ?COMPREHENSIVE METABOLIC PANEL  ?LIPASE, BLOOD  ?TROPONIN I (HIGH SENSITIVITY)  ? ? ? ?EKG: ? EKG Interpretation ? ?Date/Time:  Thursday Feb 04 2022 06:01:47 EDT ?Ventricular Rate:  98 ?PR Interval:  204 ?QRS Duration: 82 ?QT Interval:  329 ?QTC Calculation: 420 ?R Axis:   74 ?Text Interpretation: Sinus rhythm Consider left atrial enlargement Borderline  repolarization abnormality Confirmed by Pryor Curia [phone]) on 02/04/2022 6:03:32 AM ?  ? ?  ? ? ? ?RADIOLOGY: ?My personal review and interpretation of imaging: CT scan shows no aneurysm or rupture. ? ?I have personally reviewed all radiology reports.   ?CT CHEST ABDOMEN PELVIS WO CONTRAST ? ?Result Date: 02/04/2022 ?CLINICAL DATA:  Pulsatile upper abdominal pain radiating to the chest. Contrast allergy with history of oral swelling. EXAM: CT CHEST, ABDOMEN AND PELVIS WITHOUT CONTRAST TECHNIQUE: Multidetector CT imaging of the chest, abdomen and pelvis was performed following the standard protocol without IV contrast.  RADIATION DOSE REDUCTION: This exam was performed according to the departmental dose-optimization program which includes automated exposure control, adjustment of the mA and/or kV according to patient size and/or use of iterative reconstruction technique. COMPARISON:  08/25/2021 abdominal CT FINDINGS: CT CHEST FINDINGS Cardiovascular: Normal heart size. No pericardial effusion. Extensive atheromatous calcification of the aorta and coronaries. No aortic aneurysm, intimal calcification displacement, intramural hematoma, or inflammatory/rupture findings. Mediastinum/Nodes: No hematoma, pneumothorax, or mass. Lungs/Pleura: There is no edema, consolidation, effusion, or pneumothorax. Centrally solid and peripherally ground-glass nodule in the left upper lobe in total measuring up to 16 mm in size. Similar appearing nodule in the right upper lobe just above the minor fissure, 14 mm in diameter. Musculoskeletal: Generalized degenerative change without acute finding CT ABDOMEN PELVIS FINDINGS Hepatobiliary: No focal liver abnormality.Cholecystectomy. Pancreas: Unremarkable. Spleen: Unremarkable. Adrenals/Urinary Tract: 19 mm left adrenal nodule stable since at least 2008 consistent with benign adenoma. No hydronephrosis or stone. Unremarkable bladder. Stomach/Bowel:  No obstruction. No appendicitis.  Vascular/Lymphatic: No acute vascular abnormality. Extensive atheromatous calcification of the aorta and branch vessels. The degree of bulky calcified plaque suggests high-grade narrowing at the right common femoral artery. No m

## 2022-02-04 NOTE — Discharge Instructions (Addendum)
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 Consider one of the following in 3 months for both low-risk and high-risk individuals: (a) repeat chest CT, (b) follow-up PET-CT, or (c) tissue sampling. Consider multi disciplinary thoracic oncology referral. "  I have placed a referral to the pulmonary nodule clinic and have also given you follow-up information for oncology as these findings are concerning for cancer. ?

## 2022-02-04 NOTE — ED Provider Notes (Signed)
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 Leonides Schanz extremely concerned about risk of aortic dissection. ? ?Patient's blood pressure being monitored patient now reporting starting to have mild headache and blood pressure has spiked to 188/100.  Previously received hydralazine with improvement, will give additional dose of hydralazine and continue to monitor closely as we await CT imaging to rule out dissection in this patient with high concern for possible dissection ?  ?Delman Kitten, MD ?02/04/22 [phone] ? ?

## 2022-02-04 NOTE — ED Triage Notes (Signed)
Patient ambulatory to triage with steady gait, without difficulty or distress noted; pt reports awoke at 3am with upper abd pain that radiates up into chest; denies any accomp symptoms ?

## 2022-02-04 NOTE — ED Notes (Signed)
Informed RN bed assigned 

## 2022-02-04 NOTE — ED Provider Notes (Signed)
CT Angio Chest/Abd/Pel for Dissection W and/or Wo Contrast ? ?Result Date: 02/04/2022 ?CLINICAL DATA:  Abdominal pain. EXAM: CT ANGIOGRAPHY CHEST, ABDOMEN AND PELVIS TECHNIQUE: Non-contrast CT of the chest was initially obtained. Multidetector CT imaging through the chest, abdomen and pelvis was performed using the standard protocol during bolus administration of intravenous contrast. Multiplanar reconstructed images and MIPs were obtained and reviewed to evaluate the vascular anatomy. RADIATION DOSE REDUCTION: This exam was performed according to the departmental dose-optimization program which includes automated exposure control, adjustment of the mA and/or kV according to patient size and/or use of iterative reconstruction technique. CONTRAST:  122m OMNIPAQUE IOHEXOL 350 MG/ML SOLN COMPARISON:  CT of same day. FINDINGS: CTA CHEST FINDINGS Cardiovascular: Atherosclerosis of thoracic aorta is noted without aneurysm or dissection. Normal cardiac size. No pericardial effusion. Great vessels are widely patent without significant stenosis. Coronary calcifications are noted. Mediastinum/Nodes: No enlarged mediastinal, hilar, or axillary lymph nodes. Thyroid gland, trachea, and esophagus demonstrate no significant findings. Lungs/Pleura: No pneumothorax or pleural effusion is noted. 14 x 10 mm irregular nodular density is noted in right upper lobe best seen on image number 74 series 5. 10 x 8 mm nodular opacity is seen in left upper lobe best seen on image number 46 of series 5. Musculoskeletal: No chest wall abnormality. No acute or significant osseous findings. Review of the MIP images confirms the above findings. CTA ABDOMEN AND PELVIS FINDINGS VASCULAR Aorta: Atherosclerosis of abdominal aorta is noted without aneurysm or dissection. Celiac: Severe stenosis is noted at its origin without thrombus. SMA: Patent without evidence of aneurysm, dissection, vasculitis or significant stenosis. Renals: Both renal arteries are  patent without evidence of aneurysm, dissection, vasculitis, fibromuscular dysplasia or significant stenosis. IMA: Patent without evidence of aneurysm, dissection, vasculitis or significant stenosis. Inflow: Patent without evidence of aneurysm, dissection, vasculitis or significant stenosis. Veins: No obvious venous abnormality within the limitations of this arterial phase study. Review of the MIP images confirms the above findings. NON-VASCULAR Hepatobiliary: No focal liver abnormality is seen. Status post cholecystectomy. No biliary dilatation. Pancreas: Unremarkable. No pancreatic ductal dilatation or surrounding inflammatory changes. Spleen: Normal in size without focal abnormality. Adrenals/Urinary Tract: Stable left adrenal adenoma. Right adrenal gland is unremarkable. No hydronephrosis or renal obstruction is noted. Urinary bladder is unremarkable. Stomach/Bowel: Stomach appears normal. There is no definite evidence of bowel obstruction or inflammation. Lymphatic: No adenopathy is noted. Reproductive: Status post hysterectomy. No adnexal masses. Other: No abdominal wall hernia or abnormality. No abdominopelvic ascites. Musculoskeletal: No acute or significant osseous findings. Review of the MIP images confirms the above findings. IMPRESSION: There is no evidence of thoracic or abdominal aortic dissection or aneurysm. As noted on prior CT scan of same day, 2 ill-defined nodular opacities are noted bilaterally, the largest measuring 14 mm. These are concerning for malignancy. Consider one of the following in 3 months for both low-risk and high-risk individuals: (a) repeat chest CT, (b) follow-up PET-CT, or (c) tissue sampling. This recommendation follows the consensus statement: Guidelines for Management of Incidental Pulmonary Nodules Detected on CT Images: From the Fleischner Society 2017; Radiology 2017; 284:228-243. 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 Stable left adrenal adenoma is  noted. Aortic Atherosclerosis (ICD10-I70.0). Electronically Signed   By: JMarijo ConceptionM.D.   On: 02/04/2022 11:55  ? ? ?----------------------------------------- ?3:10 PM on 02/04/2022 ?----------------------------------------- ?Patient's pain and blood pressure have improved.  Given her presentation with severe hypertension, severe epigastric pain and findings on CT angiography that  are concerning for stenosis of the celiac artery, I discussed case with vascular surgery Dr. Ronalee Belts.  He advised it would be reasonable to admit the patient for further work-up and evaluation, recommends admission to the hospitalist service.  Vascular surgery will see in consultation tonight and does advised to have plan to make the patient n.p.o. at midnight. ? ?Discussed with patient, pain is well controlled at this time and also her blood pressure better.  We will give her home antihypertensive dose, regular diet ordered.  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 ? ?Consulted with hospitalist Dr. Dwyane Dee, And after discussion plan to admit for [place] or ongoing care and anticipated vascular surgery consult ? ?Patient understanding agreeable with plan for admission and understands vascular surgeon plans to see and evaluate her for her symptoms of epigastric pain this afternoon ?  ?Delman Kitten, MD ?02/04/22 1532 ? ?

## 2022-02-04 NOTE — H&P (Signed)
[facility] ?History and Physical ? ? ?Patient: Teresa Sparks FAO:[id number]   ?PCP: [place] Pink, MD DOB: [birth year]   ?DOA: 02/04/2022   DOS: 02/04/2022   ?DOS: the patient was seen and examined on 02/04/2022 ? ? ?Patient coming from: The patient is coming from Home ? ?Chief Complaint: Severe epigastric pain ? ?HPI: Teresa Sparks is a 71 y.o. female with Past medical history of hypertension, GERD, hiatal hernia, chronic abdominal pain, depression, as reviewed from EMR, presented to [facility] with sudden onset of severe pulsating epigastric pain radiated to chest and back, associated with shortness of breath and nausea. ? ?ED Course:  ?Hypertensive emergency, blood pressure 222/90, heart rate 102 ?Troponin negative x2, CMP within normal range, CBC shows microcytic anemia hemoglobin 10.9. ?CT chest abdomen pelvis shows pulmonary nodules concerning for bronchogenic carcinoma, recommended follow-up in 3 months with PET scan or biopsy ?CTA chest abdomen pelvis ruled out aortic aneurysm, showed severe celiac artery stenosis ? ? ? ?Review of Systems: as mentioned in the history of present illness.  ?All other systems reviewed and are negative. ? ?Past Medical History:  ?Diagnosis Date  ? Adenomatous colon polyp   ? Adrenal adenoma   ? Allergic genetic state   ? Arthritis   ? Chronic abdominal pain   ? GERD (gastroesophageal reflux disease)   ? Hiatal hernia   ? Hypertension   ? ?Past Surgical History:  ?Procedure Laterality Date  ? ABDOMINAL HYSTERECTOMY    ? CHOLECYSTECTOMY    ? COLONOSCOPY    ? COLONOSCOPY WITH PROPOFOL N/A 07/24/2018  ? Procedure: COLONOSCOPY WITH PROPOFOL;  Surgeon: Manya Silvas, MD;  Location: [facility] ENDOSCOPY;  Service: Endoscopy;  Laterality: N/A;  ? FRACTURE SURGERY    ? JOINT REPLACEMENT    ? 6 years ago  ? ?Social History:  reports that she has quit smoking. She has never used smokeless tobacco. She reports that she does not drink alcohol and does not use drugs. ? ?Allergies   ?Allergen Reactions  ? Ivp Dye [Iodinated Contrast Media] Anaphylaxis  ? Ciprofloxacin Diarrhea  ?  Nausea, vomiting   ? Codeine Sulfate [Codeine] Diarrhea  ?  Nausea, vomiting  ? Meloxicam   ? Penicillins Nausea Only  ? Protonix [Pantoprazole Sodium]   ? Shellfish Allergy Swelling  ? Sulfa Antibiotics Rash  ? ? ? ?Family history reviewed and not pertinent ?Family History  ?Problem Relation Age of Onset  ? Hypertension Mother   ? Stroke Mother   ? Epilepsy Mother   ? Colon polyps Sister   ? Breast cancer Other   ? ? ? ?Prior to Admission medications   ?Medication Sig Start Date End Date Taking? Authorizing Provider  ?acetaminophen (TYLENOL) 650 MG CR tablet Take 650 mg by mouth every 8 (eight) hours as needed for pain.   Yes [provider]  ?aspirin 81 MG chewable tablet Chew by mouth daily.   Yes [provider]  ?cetirizine (ZYRTEC) 10 MG tablet Take 10 mg by mouth daily.   Yes [provider]  ?colchicine 0.6 MG tablet Take by mouth. 10/14/21  Yes [provider]  ?DULoxetine (CYMBALTA) 30 MG capsule Take 30 mg by mouth daily.   Yes [provider]  ?esomeprazole (NEXIUM) 40 MG capsule Take 1 capsule by mouth daily. 10/13/21  Yes [provider]  ?fluticasone (FLONASE) 50 MCG/ACT nasal spray Place into both nostrils daily.   Yes [provider]  ?gabapentin (NEURONTIN) 300 MG capsule Take 300 mg by  mouth 2 (two) times daily.   Yes [provider]  ?hydrALAZINE (APRESOLINE) 50 MG tablet Take 50 mg by mouth 2 (two) times daily.   Yes [provider]  ?lisinopril-hydrochlorothiazide (ZESTORETIC) 10-12.5 MG tablet Take 1 tablet by mouth daily. 10/13/21  Yes [provider]  ?Metoprolol Succinate 25 MG CS24 Take by mouth daily.   Yes [provider]  ?Multiple Vitamins-Minerals (CENTRUM ADULTS PO) Take by mouth daily.   Yes [provider]  ?omeprazole (PRILOSEC) 40 MG capsule Take 40 mg by mouth 2 (two) times  daily.   Yes [provider]  ?ondansetron (ZOFRAN ODT) 4 MG disintegrating tablet Take 1 tablet (4 mg total) by mouth every 8 (eight) hours as needed for nausea or vomiting. 06/26/21  Yes Duffy Bruce, MD  ?ondansetron (ZOFRAN) 4 MG tablet Take 1 tablet (4 mg total) by mouth daily as needed for nausea or vomiting. 08/25/21 08/25/22 Yes Rada Hay, MD  ?PARoxetine (PAXIL) 10 MG tablet Take 1 tablet by mouth daily at 6 (six) AM. 11/06/21  Yes [provider]  ?traMADol (ULTRAM) 50 MG tablet Take 1 tablet (50 mg total) by mouth every 6 (six) hours as needed for severe pain. 07/29/21  Yes Delman Kitten, MD  ?famotidine (PEPCID) 20 MG tablet Take 1 tablet (20 mg total) by mouth daily for 10 days. 06/26/21 07/06/21  Duffy Bruce, MD  ?potassium chloride SA (KLOR-CON M) 20 MEQ tablet Take 1 tablet (20 mEq total) by mouth 2 (two) times daily for 5 days. 08/25/21 08/30/21  Rada Hay, MD  ? ? ?Physical Exam: ?Vitals:  ? 02/04/22 1100 02/04/22 1200 02/04/22 1230 02/04/22 1300  ?BP: (!) 146/59 (!) 156/71 (!) 149/53 (!) 146/69  ?Pulse: 92 94 94 92  ?Resp: 19 14 (!) 22 17  ?Temp:      ?TempSrc:      ?SpO2: 98% 99% 98% 98%  ?Weight:      ?Height:      ? ? ?General: alert and oriented to time, place, and person. Appear in mild distress, affect appropriate ?Eyes: PERRLA, Conjunctiva normal ?ENT: Oral Mucosa Clear, moist  ?Neck: no JVD, no Abnormal Mass Or lumps ?Cardiovascular: S1 and S2 Present, no Murmur, peripheral pulses symmetrical ?Respiratory: good respiratory effort, Bilateral Air entry equal and Decreased, signs of accessory muscle use, Clear to Auscultation, no Crackles, no wheezes ?Abdomen: Bowel Sound present, Soft and severe epigastric tenderness, no hernia ?Skin: no rashes  ?Extremities: no Pedal edema, no calf tenderness ?Neurologic: without any new focal findings ?Gait not checked due to patient safety concerns ? ?Data Reviewed: ?I have personally reviewed and interpreted labs,  imaging as discussed below. ? ?CBC: ?Recent Labs  ?Lab 02/04/22 ?7824  ?WBC 9.6  ?NEUTROABS 7.2  ?HGB 10.9*  ?HCT 35.8*  ?MCV 76.5*  ?PLT 355  ? ?Basic Metabolic Panel: ?Recent Labs  ?Lab 02/04/22 ?2353  ?NA 139  ?K 4.1  ?CL 107  ?CO2 25  ?GLUCOSE 122*  ?BUN 15  ?CREATININE 0.94  ?CALCIUM 9.8  ? ?GFR: ?Estimated Creatinine Clearance: 73.6 mL/min (by C-G formula based on SCr of 0.94 mg/dL). ?Liver Function Tests: ?Recent Labs  ?Lab 02/04/22 ?6144  ?AST 19  ?ALT 16  ?ALKPHOS 41  ?BILITOT 0.5  ?PROT 7.5  ?ALBUMIN 4.1  ? ?Recent Labs  ?Lab 02/04/22 ?3154  ?LIPASE 26  ? ?No results for input(s): AMMONIA in the last 168 hours. ?Coagulation Profile: ?No results for input(s): INR, PROTIME in the last 168 hours. ?Cardiac  Enzymes: ?No results for input(s): CKTOTAL, CKMB, CKMBINDEX, TROPONINI in the last 168 hours. ?BNP (last 3 results) ?No results for input(s): PROBNP in the last 8760 hours. ?HbA1C: ?No results for input(s): HGBA1C in the last 72 hours. ?CBG: ?No results for input(s): GLUCAP in the last 168 hours. ?Lipid Profile: ?No results for input(s): CHOL, HDL, LDLCALC, TRIG, CHOLHDL, LDLDIRECT in the last 72 hours. ?Thyroid Function Tests: ?No results for input(s): TSH, T4TOTAL, FREET4, T3FREE, THYROIDAB in the last 72 hours. ?Anemia Panel: ?No results for input(s): VITAMINB12, FOLATE, FERRITIN, TIBC, IRON, RETICCTPCT in the last 72 hours. ?Urine analysis: ?   ?Component Value Date/Time  ? COLORURINE YELLOW (A) 08/25/2021 1804  ? APPEARANCEUR CLEAR (A) 08/25/2021 1804  ? APPEARANCEUR Clear 05/08/2014 0316  ? LABSPEC 1.020 08/25/2021 1804  ? LABSPEC 1.010 05/08/2014 0316  ? PHURINE 6.5 08/25/2021 1804  ? [place] NEGATIVE 08/25/2021 1804  ? GLUCOSEU Negative 05/08/2014 0316  ? [place] NEGATIVE 08/25/2021 1804  ? [place] NEGATIVE 08/25/2021 1804  ? BILIRUBINUR Negative 05/08/2014 0316  ? KETONESUR 15 (A) 08/25/2021 1804  ? PROTEINUR 30 (A) 08/25/2021 1804  ? NITRITE NEGATIVE 08/25/2021 1804  ? LEUKOCYTESUR NEGATIVE  08/25/2021 1804  ? LEUKOCYTESUR Negative 05/08/2014 0316  ? ? ?Radiological Exams on Admission: ?CT Angio Chest/Abd/Pel for Dissection W and/or Wo Contrast ? ?Result Date: 02/04/2022 ?CLINICAL DATA:  Abdominal pain.

## 2022-02-04 NOTE — ED Notes (Signed)
ED Provider at bedside. 

## 2022-02-04 NOTE — Consult Note (Signed)
?[facility] ?Vascular Consult Note ? ?MRN : [id number] ? ?Teresa Sparks is a 71 y.o. (Oct 14, 1950) female who presents with chief complaint of  ?Chief Complaint  ?Patient presents with  ? Abdominal Pain  ?. ? ? ?Consulting Physician: Val Riles, MD ?Reason for consult: Abdominal pain ?History of Present Illness: Teresa Sparks is a 71 year old female with past medical history of hypertension, hiatal hernia, GERD and chronic abdominal pain that presented to [facility] due to severe epigastric pain that was pulsating and radiating towards her chest and back.  The patient notes that prior to this episode she has had episodes of spasming epigastric pain and she notes that this pain would happen following eating.  She notes that due to this pain she typically only eats approximately 1 meal per day.  She also notes that post eating she typically develops nausea as well.  She notes that this pain has been going on for well over the last year.  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 ?Current Facility-Administered Medications  ?Medication Dose Route Frequency Provider Last Rate Last Admin  ? 0.9 %  sodium chloride infusion  250 mL Intravenous PRN Val Riles, MD      ? acetaminophen (TYLENOL) tablet 650 mg  650 mg Oral Q6H PRN Val Riles, MD      ? Or  ? acetaminophen (TYLENOL) suppository 650 mg  650 mg Rectal Q6H PRN Val Riles, MD      ? aspirin chewable tablet 81 mg  81 mg Oral Daily Val Riles, MD   81 mg at 02/04/22 1833  ? bisacodyl (DULCOLAX) EC tablet 5 mg  5 mg Oral Daily PRN Val Riles, MD      ? colchicine tablet 0.6 mg  0.6 mg Oral Daily Val Riles, MD      ? DULoxetine (CYMBALTA) DR capsule 30 mg  30 mg Oral Daily Val Riles, MD   30 mg at 02/04/22 1833  ? [START ON 02/05/2022] enoxaparin (LOVENOX) injection 40 mg  40 mg Subcutaneous Q24H Val Riles, MD      ? Derrill Memo  ON 02/05/2022] esomeprazole (NEXIUM) capsule 40 mg  40 mg Oral Q1200 Val Riles, MD      ? fluticasone (FLONASE) 50 MCG/ACT nasal spray 1 spray  1 spray Each Nare Daily Val Riles, MD      ? gabapentin (NEURONTIN) capsule 300 mg  300 mg Oral BID Val Riles, MD   300 mg at 02/04/22 1942  ? hydrALAZINE (APRESOLINE) injection 10 mg  10 mg Intravenous Q6H PRN Val Riles, MD      ? hydrALAZINE (APRESOLINE) tablet 50 mg  50 mg Oral BID Val Riles, MD   50 mg at 02/04/22 1833  ? hydrochlorothiazide (HYDRODIURIL) tablet 12.5 mg  12.5 mg Oral Daily Dallie Piles, RPH   12.5 mg at 02/04/22 1833  ? lisinopril (ZESTRIL) tablet 10 mg  10 mg Oral Daily Dallie Piles, RPH   10 mg at 02/04/22 1833  ? loratadine (CLARITIN) tablet 10 mg  10 mg Oral Daily Val Riles, MD      ? metoprolol succinate (TOPROL-XL) 24 hr tablet 25 mg  25 mg Oral Daily Val Riles, MD   25 mg at 02/04/22 1832  ? morphine (PF) 2 MG/ML injection 2 mg  2 mg Intravenous Q2H PRN Val Riles, MD   2 mg at 02/04/22 1942  ? [START  ON 02/05/2022] multivitamin with minerals tablet 1 tablet  1 tablet Oral Daily Val Riles, MD      ? ondansetron [facility]) tablet 4 mg  4 mg Oral Q6H PRN Val Riles, MD      ? Or  ? ondansetron (ZOFRAN) injection 4 mg  4 mg Intravenous Q6H PRN Val Riles, MD      ? Derrill Memo ON 02/05/2022] PARoxetine (PAXIL) tablet 10 mg  10 mg Oral Q0600 Val Riles, MD      ? polyethylene glycol (MIRALAX / GLYCOLAX) packet 17 g  17 g Oral Daily PRN Val Riles, MD      ? sodium chloride flush (NS) 0.9 % injection 3 mL  3 mL Intravenous Q12H Val Riles, MD   3 mL at 02/04/22 1943  ? sodium chloride flush (NS) 0.9 % injection 3 mL  3 mL Intravenous PRN Val Riles, MD      ? ? ?Past Medical History:  ?Diagnosis Date  ? Adenomatous colon polyp   ? Adrenal adenoma   ? Allergic genetic state   ? Arthritis   ? Chronic abdominal pain   ? GERD (gastroesophageal reflux disease)   ? Hiatal hernia   ? Hypertension   ? ? ?Past  Surgical History:  ?Procedure Laterality Date  ? ABDOMINAL HYSTERECTOMY    ? CHOLECYSTECTOMY    ? COLONOSCOPY    ? COLONOSCOPY WITH PROPOFOL N/A 07/24/2018  ? Procedure: COLONOSCOPY WITH PROPOFOL;  Surgeon: Manya Silvas, MD;  Location: [facility] ENDOSCOPY;  Service: Endoscopy;  Laterality: N/A;  ? FRACTURE SURGERY    ? JOINT REPLACEMENT    ? 6 years ago  ? ? ?Social History ?Social History  ? ?Tobacco Use  ? Smoking status: Former  ? Smokeless tobacco: Never  ?Vaping Use  ? Vaping Use: Never used  ?Substance Use Topics  ? Alcohol use: No  ? Drug use: Never  ? ? ?Family History ?Family History  ?Problem Relation Age of Onset  ? Hypertension Mother   ? Stroke Mother   ? Epilepsy Mother   ? Colon polyps Sister   ? Breast cancer Other   ? ? ?Allergies  ?Allergen Reactions  ? Ivp Dye [Iodinated Contrast Media] Anaphylaxis  ? Ciprofloxacin Diarrhea  ?  Nausea, vomiting   ? Codeine Sulfate [Codeine] Diarrhea  ?  Nausea, vomiting  ? Meloxicam   ? Penicillins Nausea Only  ? Protonix [Pantoprazole Sodium] Hives  ? Shellfish Allergy Swelling  ? Sulfa Antibiotics Rash  ? ? ? ?REVIEW OF SYSTEMS (Negative unless checked) ? ?Constitutional: '[]'$ Weight loss  '[]'$ Fever  '[]'$ Chills ?Cardiac: '[]'$ Chest pain   '[]'$ Chest pressure   '[]'$ Palpitations   '[]'$ Shortness of breath when laying flat   '[]'$ Shortness of breath at rest   '[]'$ Shortness of breath with exertion. ?Vascular:  '[]'$ Pain in legs with walking   '[]'$ Pain in legs at rest   '[]'$ Pain in legs when laying flat   '[]'$ Claudication   '[]'$ Pain in feet when walking  '[]'$ Pain in feet at rest  '[]'$ Pain in feet when laying flat   '[]'$ History of DVT   '[]'$ Phlebitis   '[]'$ Swelling in legs   '[]'$ Varicose veins   '[]'$ Non-healing ulcers ?Pulmonary:   '[]'$ Uses home oxygen   '[]'$ Productive cough   '[]'$ Hemoptysis   '[]'$ Wheeze  '[]'$ COPD   '[]'$ Asthma ?Neurologic:  '[]'$ Dizziness  '[]'$ Blackouts   '[]'$ Seizures   '[]'$ History of stroke   '[]'$ History of TIA  '[]'$ Aphasia   '[]'$ Temporary blindness   '[]'$ Dysphagia   '[]'$ Weakness or numbness in arms   '[]'$   Weakness or numbness in  legs ?Musculoskeletal:  '[]'$ Arthritis   '[]'$ Joint swelling   '[]'$ Joint pain   '[]'$ Low back pain ?Hematologic:  '[]'$ Easy bruising  '[]'$ Easy bleeding   '[]'$ Hypercoagulable state   '[]'$ Anemic  '[]'$ Hepatitis ?Gastrointestinal:  '[]'$ Blood in stool   '[]'$ Vomiting blood  '[]'$ Gastroesophageal reflux/heartburn   '[]'$ Difficulty swallowing. ?Genitourinary:  '[]'$ Chronic kidney disease   '[]'$ Difficult urination  '[]'$ Frequent urination  '[]'$ Burning with urination   '[]'$ Blood in urine ?Skin:  '[]'$ Rashes   '[]'$ Ulcers   '[]'$ Wounds ?Psychological:  '[]'$ History of anxiety   '[]'$  History of major depression. ? ?Physical Examination ? ?Vitals:  ? 02/04/22 1600 02/04/22 1730 02/04/22 1819 02/04/22 1933  ?BP: (!) 124/57 (!) 176/98 (!) 147/73 (!) 161/75  ?Pulse: 84 88 91 84  ?Resp: 13 (!) '22 17 17  '$ ?Temp:  99 ?F (37.2 ?C) 98.4 ?F (36.9 ?C) 98 ?F (36.7 ?C)  ?TempSrc:  Oral Oral Oral  ?SpO2: 99% 98% 97% 100%  ?Weight:      ?Height:      ? ?Body mass index is 33.97 kg/m?. ?Gen:  WD/WN, NAD ?Head: [place]/AT, No temporalis wasting. Prominent temp pulse not noted. ?Ear/Nose/Throat: Hearing grossly intact, nares w/o erythema or drainage, oropharynx w/o Erythema/Exudate ?Eyes: Sclera non-icteric, conjunctiva clear ?Neck: Trachea midline.  No JVD.  ?Pulmonary:  Good air movement, respirations not labored, equal bilaterally.  ?Cardiac: RRR, normal S1, S2. ?Vascular:  ?Vessel Right Left  ?PT Palpable Palpable  ?DP Palpable Palpable  ? ?Gastrointestinal: soft, non-tender/non-distended. No guarding/reflex.  ?Musculoskeletal: M/S 5/5 throughout.  Extremities without ischemic changes.  No deformity or atrophy. No edema. ?Neurologic: Sensation grossly intact in extremities.  Symmetrical.  Speech is fluent. Motor exam as listed above. ?Psychiatric: Judgment intact, Mood & affect appropriate for pt's clinical situation. ?Dermatologic: No rashes or ulcers noted.  No cellulitis or open wounds. ?Lymph : No Cervical, Axillary, or Inguinal lymphadenopathy. ? ? ? ?CBC ?Lab Results  ?Component Value Date  ? WBC  9.6 02/04/2022  ? HGB 10.9 (L) 02/04/2022  ? HCT 35.8 (L) 02/04/2022  ? MCV 76.5 (L) 02/04/2022  ? PLT 355 02/04/2022  ? ? ?BMET ?   ?Component Value Date/Time  ? NA 139 02/04/2022 0637  ? NA 138 08/12/

## 2022-02-05 ENCOUNTER — Encounter
Admission: EM | Disposition: A | Discharge status: Home / Self Care | Payer: Self-pay | Source: Home / Self Care | Attending: Student

## 2022-02-05 DIAGNOSIS — K551 Chronic vascular disorders of intestine: Secondary | ICD-10-CM

## 2022-02-05 HISTORY — PX: VISCERAL ANGIOGRAPHY: CATH118276

## 2022-02-05 LAB — CBC
HCT: 35.2 % — ABNORMAL LOW (ref 36.0–46.0)
Hemoglobin: 10.9 g/dL — ABNORMAL LOW (ref 12.0–15.0)
MCH: 23.7 pg — ABNORMAL LOW (ref 26.0–34.0)
MCHC: 31 g/dL (ref 30.0–36.0)
MCV: 76.5 fL — ABNORMAL LOW (ref 80.0–100.0)
Platelets: 411 10*3/uL — ABNORMAL HIGH (ref 150–400)
RBC: 4.6 MIL/uL (ref 3.87–5.11)
RDW: 16.5 % — ABNORMAL HIGH (ref 11.5–15.5)
WBC: 12.4 10*3/uL — ABNORMAL HIGH (ref 4.0–10.5)
nRBC: 0 % (ref 0.0–0.2)

## 2022-02-05 LAB — BASIC METABOLIC PANEL
Anion gap: 10 (ref 5–15)
BUN: 17 mg/dL (ref 8–23)
CO2: 25 mmol/L (ref 22–32)
Calcium: 10 mg/dL (ref 8.9–10.3)
Chloride: 105 mmol/L (ref 98–111)
Creatinine, Ser: 1 mg/dL (ref 0.44–1.00)
GFR, Estimated: >60 mL/min (ref >60)
Glucose, Bld: 103 mg/dL — ABNORMAL HIGH (ref 70–99)
Potassium: 3.8 mmol/L (ref 3.5–5.1)
Sodium: 140 mmol/L (ref 135–145)

## 2022-02-05 LAB — MAGNESIUM: Magnesium: 2.5 mg/dL — ABNORMAL HIGH (ref 1.7–2.4)

## 2022-02-05 LAB — PHOSPHORUS: Phosphorus: 4.5 mg/dL (ref 2.5–4.6)

## 2022-02-05 LAB — BUN: BUN: 18 mg/dL (ref 8–23)

## 2022-02-05 LAB — CREATININE, SERUM
Creatinine, Ser: 0.97 mg/dL (ref 0.44–1.00)
GFR, Estimated: >60 mL/min (ref >60)

## 2022-02-05 SURGERY — VISCERAL ARTERY INTERVENTION
Anesthesia: Moderate Sedation

## 2022-02-05 SURGERY — VISCERAL ANGIOGRAPHY
Anesthesia: Moderate Sedation

## 2022-02-05 MED ORDER — DIPHENHYDRAMINE HCL 50 MG/ML IJ SOLN
INTRAMUSCULAR | Status: AC
  Filled 2022-02-05: qty 1

## 2022-02-05 MED ORDER — MIDAZOLAM HCL 2 MG/2ML IJ SOLN
INTRAMUSCULAR | Status: DC | PRN
  Administered 2022-02-05 (×4): 1 mg via INTRAVENOUS

## 2022-02-05 MED ORDER — FENTANYL CITRATE (PF) 100 MCG/2ML IJ SOLN
INTRAMUSCULAR | Status: DC | PRN
  Administered 2022-02-05 (×4): 25 ug via INTRAVENOUS

## 2022-02-05 MED ORDER — SODIUM CHLORIDE 0.9 % IV SOLN: 250.0000 mL | INTRAVENOUS | Status: DC | PRN

## 2022-02-05 MED ORDER — ONDANSETRON HCL 4 MG/2ML IJ SOLN: 4.0000 mg | Freq: Four times a day (QID) | INTRAMUSCULAR | Status: DC | PRN

## 2022-02-05 MED ORDER — DIPHENHYDRAMINE HCL 50 MG/ML IJ SOLN
50.0000 mg | Freq: Every day | INTRAMUSCULAR | Status: AC | PRN
  Administered 2022-02-05: 50 mg via INTRAVENOUS
  Filled 2022-02-05: qty 1

## 2022-02-05 MED ORDER — FENTANYL CITRATE PF 50 MCG/ML IJ SOSY
PREFILLED_SYRINGE | INTRAMUSCULAR | Status: AC
  Filled 2022-02-05: qty 1

## 2022-02-05 MED ORDER — FAMOTIDINE 20 MG PO TABS
ORAL_TABLET | ORAL | Status: AC
  Administered 2022-02-05: 40 mg via ORAL
  Filled 2022-02-05: qty 2

## 2022-02-05 MED ORDER — MIDAZOLAM HCL 2 MG/2ML IJ SOLN
INTRAMUSCULAR | Status: AC
  Filled 2022-02-05: qty 2

## 2022-02-05 MED ORDER — METHYLPREDNISOLONE SODIUM SUCC 125 MG IJ SOLR: 125.0000 mg | Freq: Once | INTRAMUSCULAR | Status: AC | PRN

## 2022-02-05 MED ORDER — VANCOMYCIN HCL 1500 MG/300ML IV SOLN
1500.0000 mg | INTRAVENOUS | Status: DC
  Filled 2022-02-05 (×2): qty 300

## 2022-02-05 MED ORDER — MIDAZOLAM HCL 2 MG/ML PO SYRP: 8.0000 mg | ORAL_SOLUTION | Freq: Once | ORAL | Status: DC | PRN

## 2022-02-05 MED ORDER — HYDROMORPHONE HCL 1 MG/ML IJ SOLN: 1.0000 mg | Freq: Once | INTRAMUSCULAR | Status: DC | PRN

## 2022-02-05 MED ORDER — CLOPIDOGREL BISULFATE 75 MG PO TABS
75.0000 mg | ORAL_TABLET | Freq: Every day | ORAL | Status: DC
  Administered 2022-02-06: 75 mg via ORAL
  Filled 2022-02-05: qty 1

## 2022-02-05 MED ORDER — HYDROCHLOROTHIAZIDE 12.5 MG PO TABS
12.5000 mg | ORAL_TABLET | Freq: Every day | ORAL | Status: DC
  Administered 2022-02-06: 12.5 mg via ORAL
  Filled 2022-02-05: qty 1

## 2022-02-05 MED ORDER — OXYCODONE HCL 5 MG PO TABS: 5.0000 mg | ORAL_TABLET | ORAL | Status: DC | PRN

## 2022-02-05 MED ORDER — IODIXANOL 320 MG/ML IV SOLN
INTRAVENOUS | Status: DC | PRN
Start: 2022-02-05 — End: 2022-02-05
  Administered 2022-02-05: 80 mL via INTRA_ARTERIAL

## 2022-02-05 MED ORDER — SODIUM CHLORIDE 0.9 % IV SOLN: INTRAVENOUS | Status: AC

## 2022-02-05 MED ORDER — SODIUM CHLORIDE 0.9% FLUSH: 3.0000 mL | INTRAVENOUS | Status: DC | PRN

## 2022-02-05 MED ORDER — DIPHENHYDRAMINE HCL 50 MG/ML IJ SOLN: 50.0000 mg | Freq: Once | INTRAMUSCULAR | Status: DC | PRN

## 2022-02-05 MED ORDER — SODIUM CHLORIDE 0.9 % IV SOLN: INTRAVENOUS | Status: DC

## 2022-02-05 MED ORDER — ACETAMINOPHEN 325 MG PO TABS: 650.0000 mg | ORAL_TABLET | ORAL | Status: DC | PRN

## 2022-02-05 MED ORDER — METHYLPREDNISOLONE SODIUM SUCC 40 MG IJ SOLR
40.0000 mg | INTRAMUSCULAR | Status: AC | PRN
Start: 2022-02-05 — End: 2022-02-05
  Administered 2022-02-05 (×2): 40 mg via INTRAVENOUS
  Filled 2022-02-05 (×2): qty 1

## 2022-02-05 MED ORDER — FAMOTIDINE 20 MG PO TABS: 40.0000 mg | ORAL_TABLET | Freq: Once | ORAL | Status: AC | PRN

## 2022-02-05 MED ORDER — HEPARIN SODIUM (PORCINE) 1000 UNIT/ML IJ SOLN
INTRAMUSCULAR | Status: DC | PRN
  Administered 2022-02-05: 4000 [IU] via INTRAVENOUS

## 2022-02-05 MED ORDER — VANCOMYCIN HCL 500 MG/100ML IV SOLN
INTRAVENOUS | Status: DC | PRN
Start: 2022-02-05 — End: 2022-02-05
  Administered 2022-02-05: 1500 mg via INTRAVENOUS

## 2022-02-05 MED ORDER — CLOPIDOGREL BISULFATE 75 MG PO TABS
300.0000 mg | ORAL_TABLET | ORAL | Status: AC
  Administered 2022-02-05: 300 mg via ORAL
  Filled 2022-02-05: qty 4

## 2022-02-05 MED ORDER — SODIUM CHLORIDE 0.9% FLUSH
3.0000 mL | Freq: Two times a day (BID) | INTRAVENOUS | Status: DC
  Administered 2022-02-05: 3 mL via INTRAVENOUS

## 2022-02-05 MED ORDER — METHYLPREDNISOLONE SODIUM SUCC 125 MG IJ SOLR
INTRAMUSCULAR | Status: AC
  Administered 2022-02-05: 125 mg via INTRAVENOUS
  Filled 2022-02-05: qty 2

## 2022-02-05 MED ORDER — METHYLPREDNISOLONE SODIUM SUCC 125 MG IJ SOLR
100.0000 mg | INTRAMUSCULAR | Status: DC
Start: 2022-02-05 — End: 2022-02-06

## 2022-02-05 MED ORDER — MORPHINE SULFATE (PF) 4 MG/ML IV SOLN: 2.0000 mg | INTRAVENOUS | Status: DC | PRN

## 2022-02-05 MED ORDER — HEPARIN SODIUM (PORCINE) 1000 UNIT/ML IJ SOLN
INTRAMUSCULAR | Status: AC
Start: 2022-02-05 — End: 2022-02-05
  Filled 2022-02-05: qty 10

## 2022-02-05 MED ORDER — ATORVASTATIN CALCIUM 20 MG PO TABS
10.0000 mg | ORAL_TABLET | Freq: Every day | ORAL | Status: DC
  Administered 2022-02-05: 10 mg via ORAL
  Filled 2022-02-05: qty 1

## 2022-02-05 SURGICAL SUPPLY — 28 items
BALLN LUTONIX AV 8X40X75 (BALLOONS) ×2
BALLN ULTRV 018 3X40X75 (BALLOONS) ×2
BALLN ULTRV 018 4X60X75 (BALLOONS) ×2
BALLOON LUTONIX AV 8X40X75 (BALLOONS) IMPLANT
BALLOON ULTRV 018 3X40X75 (BALLOONS) IMPLANT
BALLOON ULTRV 018 4X60X75 (BALLOONS) IMPLANT
CANNULA 5F STIFF (CANNULA) ×1 IMPLANT
CATH ANGIO 5F PIGTAIL 65CM (CATHETERS) ×1 IMPLANT
CATH BEACON 5 .035 65 KMP TIP (CATHETERS) ×1 IMPLANT
CATH MICROCATH PRGRT 2.8F 110 (CATHETERS) IMPLANT
CATH VS15FR (CATHETERS) ×1 IMPLANT
COVER DRAPE FLUORO 36X44 (DRAPES) ×1 IMPLANT
COVER PROBE U/S 5X48 (MISCELLANEOUS) ×1 IMPLANT
DEVICE STARCLOSE SE CLOSURE (Vascular Products) ×1 IMPLANT
DEVICE TORQUE .025-.038 (MISCELLANEOUS) ×1 IMPLANT
GLIDECATH 4FR STR (CATHETERS) ×1 IMPLANT
GLIDEWIRE STIFF .35X180X3 HYDR (WIRE) ×1 IMPLANT
GUIDEWIRE ADV .018X180CM (WIRE) ×1 IMPLANT
KIT ENCORE 26 ADVANTAGE (KITS) ×1 IMPLANT
MICROCATH PROGREAT 2.8F 110 CM (CATHETERS) ×2
PACK ANGIOGRAPHY (CUSTOM PROCEDURE TRAY) ×2 IMPLANT
SHEATH ANL2 6FRX45 HC (SHEATH) ×1 IMPLANT
SHEATH BRITE TIP 5FRX11 (SHEATH) ×1 IMPLANT
STENT LIFESTREAM 7X16X80 (Permanent Stent) ×1 IMPLANT
SYR MEDRAD MARK 7 150ML (SYRINGE) ×1 IMPLANT
TUBING CONTRAST HIGH PRESS 72 (TUBING) ×1 IMPLANT
WIRE GUIDERIGHT .035X150 (WIRE) ×1 IMPLANT
WIRE MAGIC TOR.035 180C (WIRE) ×1 IMPLANT

## 2022-02-05 NOTE — Progress Notes (Signed)
?  Transition of Care (TOC) Screening Note ? ? ?Patient Details  ?Name: Teresa Sparks ?Date of Birth: 06-04-1951 ? ? ?Transition of Care (TOC) CM/SW Contact:    ?Alberteen Sam, LCSW ?Phone Number: ?02/05/2022, 8:32 AM ? ? ? ?Transition of Care Department [facility]) has reviewed patient and no TOC needs have been identified at this time. We will continue to monitor patient advancement through interdisciplinary progression rounds. If new patient transition needs arise, please place a TOC consult. ? [name], [place] ?[phone] ? ?

## 2022-02-05 NOTE — Op Note (Signed)
[facility] ? Percutaneous Study/Intervention Procedural Note ? ? ?Date of Surgery: 02/05/2022 ? ?Surgeon: Hortencia Pilar ? 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 ?Post-operative diagnosis:  Same ? ?Procedure(s) Performed: ? 1.  Abdominal aortogram in the lateral views ? 2.  Selective injection of the celiac and splenic arteries third order catheter placement ? 3.  Percutaneous transluminal angioplasty and stent placement of the celiac artery ?            4.  StarClose closure right common femoral artery  ?  ? ?Anesthesia: Conscious sedation was administered under my direct supervision by the interventional radiology RN. IV Versed plus fentanyl were utilized. Continuous ECG, pulse oximetry and blood pressure was monitored throughout the entire procedure.  Conscious sedation was for a total of 1 hour 23 minutes. ? ?Sheath: 6 [place] Ansell right common femoral retrograde ? ?Contrast: 80 cc ? ?Fluoroscopy Time: 20.5 minutes ? ?Indications:  Teresa Sparks presented with abdominal pain and CT findings showing a string sign of the celiac artery.  The rest and benefits of angiography with possible intervention have been reviewed, all questions were answered and the patient has agreed to proceed with angiography and possible intervention. ? ?Procedure:  LATISSA FRICK is a 71 y.o. female who was identified and appropriate procedural time out was performed.  The patient was then placed supine on the table and prepped and draped in the usual sterile fashion.   ? ?Ultrasound was used to evaluate the left radial artery.  It was pulsatile and echolucent indicating it was patent .  A micropuncture needle was used to access the left radial artery under direct ultrasound guidance and an image was recorded for the permanent record.  A 0.035 J wire was advanced without resistance and a 5Fr sheath was placed.  J-wire was then advanced into  the ascending thoracic aorta without difficulty and the pigtail catheter was advanced over the wire. The pigtail catheter was negotiated into the distal descending aorta. ? ?Pigtail catheter was positioned to the level of T10 and lateral view of the aorta was obtained. This localized the SMA and celiac artery origins. Working in the lateral projection pigtail catheter was exchanged for a VS-1 catheter.  ? ?4000 units of heparin was given and allowed to circulate for several minutes.  ? ?Magnified lateral injection at the origin of the celiac artery was then made and a 0.018 advantage wire had been negotiated out into the splenic artery.  It was very difficult to access the celiac artery secondary to the greater than 90 the 0.035 wire would not cross nor would the catheters initially.  However once we were able to achieve purchase with the 018 advantage wire I was able to advance a prograde catheter over the 018 wire into the splenic artery and completed distal angiography.  This represents third order catheter placement.  I was then able to use a 3 mm x 40 mm balloon and angioplastied the origin to 10 atm for approximately 1 minute.  Next a 4 mm balloon was utilized in similar fashion.  At this point I was able to advance a Kumpe catheter across and exchanged the 0.018 wire for a 0.035 Magic torque wire.  A 7 mm x 16 mm lifestream stent was advanced over the wire positioned across the lesion follow-up hand injection contrast was used to verify stent placement and then the stent was deployed to 12 atm for 30 seconds. Follow-up imaging  demonstrated the stent in excellent position but somewhat undersized and a 8 mm Lutonix balloon was then advanced over the wire and used to post-dilate the stent to 10 atm for 1 minute. Follow-up imaging now demonstrated an excellent result with a stent that was well approximated and of appropriate diameter to the celiac artery.  Both lateral and AP views were obtained demonstrating  distal runoff is preserved. ? ?After review these images the catheters removed over wire oblique view of the right groin is obtained and a minx device deployed without difficulty there are no immediate complications. ? ?Findings:  ? Aortogram: Initial imaging in the lateral projection show a string sign at the origin of the celiac.  Distal to this lesion there is moderate poststenotic dilatation.  The hepatic and splenic arteries are widely patent.  Once I was able to cross the lesion we deployed a 7 mm stent and then posted it to 8 mm.  Follow-up imaging demonstrated an excellent result with good apposition of the stent and less than 10% residual stenosis. ? ?Summary: Successful revascularization of the celiac artery  ? ?Disposition: Patient was taken to the recovery room in stable condition having tolerated the procedure well. ? ?Gregory Schnier ?02/05/2022,4:03 PM ?  ?

## 2022-02-05 NOTE — Progress Notes (Signed)
[facility] Progress Note ? ?Patient: Teresa Sparks    NID:[id number]  DOA: 02/04/2022    ? ?Date of Service: the patient was seen and examined on 02/05/2022 ? ?Chief Complaint  ?Patient presents with  ? Abdominal Pain  ? ?Brief hospital course: ?Teresa Sparks is a 71 y.o. female with Past medical history of hypertension, GERD, hiatal hernia, chronic abdominal pain, depression, as reviewed from EMR, presented to [facility] with sudden onset of severe pulsating epigastric pain radiated to chest and back, associated with shortness of breath and nausea. ?  ?ED Course:  ?Hypertensive emergency, blood pressure 222/90, heart rate 102 ?Troponin negative x2, CMP within normal range, CBC shows microcytic anemia hemoglobin 10.9. ?CT chest abdomen pelvis shows pulmonary nodules concerning for bronchogenic carcinoma, recommended follow-up in 3 months with PET scan or biopsy ?CTA chest abdomen pelvis ruled out aortic aneurysm, showed severe celiac artery stenosis ? ? ? ?Assessment and Plan: ?Principal Problem: ?  Celiac artery stenosis ([place]) ?  Hypertensive emergency ?  ?Celiac artery stenosis ?Presented with severe epigastric pain radiated to chest and back ?CT angio positive for severe celiac artery stenosis ?Continued aspirin ?Continue as needed medications for pain control ?Started on clear liquid diet, keep n.p.o. after midnight ?Vascular surgery consulted, plan for celiac artery stent today   ?  ?  ?Hypertensive emergency could be secondary to severe pain ?Patient received IV hydralazine 5 mg in the ED, blood pressures now well controlled ?Resumed home medications hydralazine 50 twice daily, hydrochlorothiazide 12.5 daily, lisinopril 10 mg p.o. daily, metoprolol succinate 25 mg daily ?We will continue to monitor BP and titrate medications accordingly. ? 5/12 BP stable today ?  ?Depression, continued Cymbalta and Paxil home medications ?  ?GERD on PPI ? ? ? ?Body mass index is 33.96 kg/m?.  ?Interventions: ? ?   ?Nutrition: Full liquid diet was started yesterday, patient is n.p.o. since midnight ?DVT Prophylaxis: Subcutaneous Lovenox ?  ? ?Advance goals of care discussion: Full code ? ?Family Communication: family was present at bedside, at the time of interview.  ?The pt provided permission to discuss medical plan with the family. Opportunity was given to ask question and all questions were answered satisfactorily.  ? ?Disposition:  ?Pt is from home, admitted with celiac artery stenosis, scheduled for stenting today by vascular surgery, which precludes a safe discharge. ?Discharge to home, when clinically stable and cleared by vascular surgery.  Most likely tomorrow a.m. ? ?Subjective: No overnight issues, patient's pain is very mild and mild tenderness, denied any chest palpitation, no shortness of breath, no nausea vomiting or diarrhea. ? ? ?Physical Exam: ?General:  alert oriented to time, place, and person.  ?Appear in no distress, affect appropriate ?Eyes: PERRLA ?ENT: Oral Mucosa Clear, moist  ?Neck: no JVD,  ?Cardiovascular: S1 and S2 Present, no Murmur,  ?Respiratory: good respiratory effort, Bilateral Air entry equal and Decreased, no Crackles, no wheezes ?Abdomen: Bowel Sound present, Soft and mild epigastric tenderness,  ?Skin: no rashes ?Extremities: no Pedal edema, no calf tenderness ?Neurologic: without any new focal findings ?Gait not checked due to patient safety concerns ? ?Vitals:  ? 02/05/22 0537 02/05/22 0828 02/05/22 1239 02/05/22 1340  ?BP: (!) 107/58 109/61 (!) 159/76   ?Pulse: 70 70 77 82  ?Resp: '18 17  12  '$ ?Temp: 97.9 ?F (36.6 ?C) 97.9 ?F (36.6 ?C) 98.2 ?F (36.8 ?C) 98.4 ?F (36.9 ?C)  ?TempSrc: Oral Oral Oral Oral  ?SpO2: 99% 100% 99% 97%  ?Weight:  104.3 kg  ?Height:    '5\' 9"'$  (1.753 m)  ? ?No intake or output data in the 24 hours ending 02/05/22 1451 ?Filed Weights  ? 02/04/22 0554 02/05/22 1340  ?Weight: 104.3 kg 104.3 kg  ? ? ?Data Reviewed: ?I have personally reviewed and interpreted daily  labs, tele strips, imagings as discussed above. ?I reviewed all nursing notes, pharmacy notes, vitals, pertinent old records ?I have discussed plan of care as described above with RN and patient/family. ? ?CBC: ?Recent Labs  ?Lab 02/04/22 ?0637 02/05/22 ?0915  ?WBC 9.6 12.4*  ?NEUTROABS 7.2  --   ?HGB 10.9* 10.9*  ?HCT 35.8* 35.2*  ?MCV 76.5* 76.5*  ?PLT 355 411*  ? ?Basic Metabolic Panel: ?Recent Labs  ?Lab 02/04/22 ?0637 02/05/22 ?0915  ?NA 139 140  ?K 4.1 3.8  ?CL 107 105  ?CO2 25 25  ?GLUCOSE 122* 103*  ?BUN 15 17  ?CREATININE 0.94 1.00  ?CALCIUM 9.8 10.0  ?MG  --  2.5*  ?PHOS  --  4.5  ? ? ?Studies: ?No results found.  ?Scheduled Meds: ? [MAR Hold] aspirin  81 mg Oral Daily  ? [MAR Hold] colchicine  0.6 mg Oral Daily  ? diphenhydrAMINE      ? diphenhydrAMINE      ? [MAR Hold] DULoxetine  30 mg Oral Daily  ? [MAR Hold] enoxaparin (LOVENOX) injection  40 mg Subcutaneous Q24H  ? [MAR Hold] esomeprazole  40 mg Oral Q1200  ? fentaNYL      ? fentaNYL      ? [MAR Hold] fluticasone  1 spray Each Nare Daily  ? [MAR Hold] gabapentin  300 mg Oral BID  ? heparin sodium (porcine)      ? [MAR Hold] hydrALAZINE  50 mg Oral BID  ? [MAR Hold] hydrochlorothiazide  12.5 mg Oral Daily  ? [MAR Hold] lisinopril  10 mg Oral Daily  ? [MAR Hold] loratadine  10 mg Oral Daily  ? methylPREDNISolone (SOLU-MEDROL) injection  100 mg Intravenous To NeurOR  ? [MAR Hold] metoprolol succinate  25 mg Oral Daily  ? midazolam      ? midazolam      ? [MAR Hold] multivitamin with minerals  1 tablet Oral Daily  ? [MAR Hold] PARoxetine  10 mg Oral Q0600  ? [MAR Hold] sodium chloride flush  3 mL Intravenous Q12H  ? ?Continuous Infusions: ? [MAR Hold] sodium chloride    ? sodium chloride 75 mL/hr at 02/05/22 1343  ? vancomycin    ? vancomycin    ? ?PRN Meds: [MAR Hold] sodium chloride, [MAR Hold] acetaminophen **OR** [MAR Hold] acetaminophen, [MAR Hold] bisacodyl, diphenhydrAMINE, fentaNYL, [MAR Hold] hydrALAZINE, [MAR Hold]  HYDROmorphone (DILAUDID)  injection, midazolam, midazolam, [MAR Hold]  morphine injection, [MAR Hold] ondansetron **OR** [MAR Hold] ondansetron (ZOFRAN) IV, [MAR Hold] polyethylene glycol, [MAR Hold] sodium chloride flush, vancomycin ? ?Time spent: 35 minutes ? ?Author: ?Val Riles MD ?[facility] ?02/05/2022 2:51 PM ? ?To reach On-call, see care teams to locate the attending and reach out to them via [email or web address]. ?If 7PM-7AM, please contact night-coverage ?If you still have difficulty reaching the attending provider, please page the [facility] (Director on Call) for [facility] on amion for assistance. ? ?

## 2022-02-06 LAB — BASIC METABOLIC PANEL
Anion gap: 6 (ref 5–15)
BUN: 19 mg/dL (ref 8–23)
CO2: 25 mmol/L (ref 22–32)
Calcium: 9.8 mg/dL (ref 8.9–10.3)
Chloride: 108 mmol/L (ref 98–111)
Creatinine, Ser: 0.94 mg/dL (ref 0.44–1.00)
GFR, Estimated: >60 mL/min (ref >60)
Glucose, Bld: 115 mg/dL — ABNORMAL HIGH (ref 70–99)
Potassium: 4.2 mmol/L (ref 3.5–5.1)
Sodium: 139 mmol/L (ref 135–145)

## 2022-02-06 LAB — CBC
HCT: 33.6 % — ABNORMAL LOW (ref 36.0–46.0)
Hemoglobin: 10.3 g/dL — ABNORMAL LOW (ref 12.0–15.0)
MCH: 23.5 pg — ABNORMAL LOW (ref 26.0–34.0)
MCHC: 30.7 g/dL (ref 30.0–36.0)
MCV: 76.5 fL — ABNORMAL LOW (ref 80.0–100.0)
Platelets: 353 10*3/uL (ref 150–400)
RBC: 4.39 MIL/uL (ref 3.87–5.11)
RDW: 16.3 % — ABNORMAL HIGH (ref 11.5–15.5)
WBC: 14 10*3/uL — ABNORMAL HIGH (ref 4.0–10.5)
nRBC: 0 % (ref 0.0–0.2)

## 2022-02-06 LAB — PHOSPHORUS: Phosphorus: 3.8 mg/dL (ref 2.5–4.6)

## 2022-02-06 LAB — MAGNESIUM: Magnesium: 2.5 mg/dL — ABNORMAL HIGH (ref 1.7–2.4)

## 2022-02-06 MED ORDER — ATORVASTATIN CALCIUM 10 MG PO TABS: 10.0000 mg | ORAL_TABLET | Freq: Every day | ORAL | 2 refills | Status: DC

## 2022-02-06 MED ORDER — OMEPRAZOLE 2 MG/ML ORAL SUSPENSION
20.0000 mg | Freq: Every day | ORAL | Status: DC
  Administered 2022-02-06: 20 mg via ORAL
  Filled 2022-02-06: qty 10

## 2022-02-06 MED ORDER — CLOPIDOGREL BISULFATE 75 MG PO TABS: 75.0000 mg | ORAL_TABLET | Freq: Every day | ORAL | 5 refills | Status: DC

## 2022-02-06 NOTE — Discharge Summary (Signed)
[facility] Discharge Summary ? ? ?Patient: Teresa Sparks [name]:[id number]  PCP: Maryland Pink, MD  ?Date of admission: 02/04/2022   Date of discharge:  02/06/2022   ?  ?Discharge Diagnoses:  ?Principal Problem: ?  Celiac artery stenosis ([place]) ? ? ?Admitted From: Home ?Disposition:  Home  ? ?Recommendations for Outpatient Follow-up:  ?PCP: in 1 wk ?Vascular surgery in 3 weeks ?Follow up LABS/TEST:   ? ? Follow-up Information   ? ? Schedule an appointment as soon as possible for a visit  with Maryland Pink, MD.   ?Specialty: Family Medicine ?Contact information: ?[place] ?[facility] ?[place] [address] ?[phone] ? ? ?  ?  ? ? Schedule an appointment as soon as possible for a visit  with Cammie Sickle, MD.   ?Specialties: Internal Medicine, Oncology ?Contact information: ?[place] [address] ?[phone] ? ? ?  ?  ? ?  ?  ? ?  ? ?Diet recommendation: Cardiac diet ? ?Activity: The patient is advised to gradually reintroduce usual activities, as tolerated ? ?Discharge Condition: stable ? ?Code Status: Full code  ? ?History of present illness: As per the H and P dictated on admission ?Hospital Course:  ?Teresa Sparks is a 71 y.o. female with Past medical history of hypertension, GERD, hiatal hernia, chronic abdominal pain, depression, as reviewed from EMR, presented to [facility] with sudden onset of severe pulsating epigastric pain radiated to chest and back, associated with shortness of breath and nausea. ?ED Course:  ?Hypertensive emergency, blood pressure 222/90, heart rate 102 ?Troponin negative x2, CMP within normal range, CBC shows microcytic anemia hemoglobin 10.9. ?CT chest abdomen pelvis shows pulmonary nodules concerning for bronchogenic carcinoma, recommended follow-up in 3 months with PET scan or biopsy ?CTA chest abdomen pelvis ruled out aortic aneurysm, showed severe celiac artery stenosis ?Assessment and Plan: ?Principal Problem: ?  Celiac  artery stenosis ([place]) ?  Hypertensive emergency ?  ?Celiac artery stenosis, Pt presented with severe epigastric pain radiated to chest and back ?CT angio positive for severe celiac artery stenosis, Continued aspirin, patient was seen by vascular surgery, s/p celiac artery stent was placed on 5/12, patient tolerated procedure well without any complications.  Patient was loaded with Plavix and started on Plavix 75 mg p.o. daily.  Patient was cleared by vascular surgery on aspirin 81 mg and Plavix 75 mg p.o. daily, started Lipitor 10 mg p.o. daily and follow-up as an outpatient. ?Hypertensive emergency could be secondary to severe pain. ?Patient received IV hydralazine 5 mg in the ED, blood pressures now well controlled ?Resumed home medications hydralazine 50 twice daily, hydrochlorothiazide 12.5 daily, lisinopril 10 mg p.o. daily, metoprolol succinate 25 mg daily.  Blood pressure remained stable during hospital stay, resumed home regimen and patient was advised to monitor BP at home and follow with the PCP for further management as an outpatient. ?Depression, continued Cymbalta and Paxil home medications ?GERD on PPI ?Body mass index is 33.96 kg/m?.  ?Interventions: ? ? ?Patient was ambulatory without any assistance. ?On the day of the discharge the patient's vitals were stable, and no other acute medical condition were reported by patient. the patient was felt safe to be discharge at Home. ? ?Consultants: Vascular surgery ?Procedures: s/p celiac artery stent insertion ? ?Discharge Exam: ?General: Appear in no distress, no Rash; Oral Mucosa Clear, moist. ?Cardiovascular: S1 and S2 Present, no Murmur, ?Respiratory: normal respiratory effort, Bilateral Air entry present and no Crackles, no wheezes ?Abdomen: Bowel Sound  present, Soft and mild tenderness, no hernia ?Extremities: no Pedal edema, no calf tenderness ?Neurology: alert and oriented to time, place, and person ?affect appropriate. ? ?Filed Weights  ? 02/04/22  0554 02/05/22 1340  ?Weight: 104.3 kg 104.3 kg  ? ?Vitals:  ? 02/06/22 0428 02/06/22 0927  ?BP: (!) 121/41 (!) 131/59  ?Pulse: 64 79  ?Resp: 16 16  ?Temp: 98 ?F (36.7 ?C) 98.1 ?F (36.7 ?C)  ?SpO2: 98% 100%  ? ? ?DISCHARGE MEDICATION: ?Allergies as of 02/06/2022   ? ?   Reactions  ? Ivp Dye [iodinated Contrast Media] Anaphylaxis  ? Ciprofloxacin Diarrhea  ? Nausea, vomiting   ? Codeine Sulfate [codeine] Diarrhea  ? Nausea, vomiting  ? Meloxicam   ? Penicillins Nausea Only  ? Protonix [pantoprazole Sodium] Hives  ? Shellfish Allergy Swelling  ? Sulfa Antibiotics Rash  ? ?  ? ?  ?Medication List  ?  ? ?TAKE these medications   ? ?acetaminophen 650 MG CR tablet ?Commonly known as: TYLENOL ?Take 650 mg by mouth every 8 (eight) hours as needed for pain. ?  ?aspirin 81 MG chewable tablet ?Chew by mouth daily. ?  ?atorvastatin 10 MG tablet ?Commonly known as: LIPITOR ?Take 1 tablet (10 mg total) by mouth daily at 6 PM. ?  ?CENTRUM ADULTS PO ?Take by mouth daily. ?  ?cetirizine 10 MG tablet ?Commonly known as: ZYRTEC ?Take 10 mg by mouth daily. ?  ?clopidogrel 75 MG tablet ?Commonly known as: PLAVIX ?Take 1 tablet (75 mg total) by mouth daily with breakfast. ?  ?colchicine 0.6 MG tablet ?Take by mouth. ?  ?DULoxetine 30 MG capsule ?Commonly known as: CYMBALTA ?Take 30 mg by mouth daily. ?  ?esomeprazole 40 MG capsule ?Commonly known as: [place] ?Take 1 capsule by mouth daily. ?  ?famotidine 20 MG tablet ?Commonly known as: PEPCID ?Take 1 tablet (20 mg total) by mouth daily for 10 days. ?  ?fluticasone 50 MCG/ACT nasal spray ?Commonly known as: FLONASE ?Place into both nostrils daily. ?  ?gabapentin 300 MG capsule ?Commonly known as: NEURONTIN ?Take 300 mg by mouth 2 (two) times daily. ?  ?hydrALAZINE 50 MG tablet ?Commonly known as: APRESOLINE ?Take 50 mg by mouth 2 (two) times daily. ?  ?lisinopril-hydrochlorothiazide 10-12.5 MG tablet ?Commonly known as: ZESTORETIC ?Take 1 tablet by mouth daily. ?  ?Metoprolol Succinate 25  MG Cs24 ?Take by mouth daily. ?  ?omeprazole 40 MG capsule ?Commonly known as: PRILOSEC ?Take 40 mg by mouth 2 (two) times daily. ?  ?ondansetron 4 MG disintegrating tablet ?Commonly known as: Zofran ODT ?Take 1 tablet (4 mg total) by mouth every 8 (eight) hours as needed for nausea or vomiting. ?  ?ondansetron 4 MG tablet ?Commonly known as: Zofran ?Take 1 tablet (4 mg total) by mouth daily as needed for nausea or vomiting. ?  ?PARoxetine 10 MG tablet ?Commonly known as: PAXIL ?Take 1 tablet by mouth daily at 6 (six) AM. ?  ?potassium chloride SA 20 MEQ tablet ?Commonly known as: KLOR-CON M ?Take 1 tablet (20 mEq total) by mouth 2 (two) times daily for 5 days. ?  ?traMADol 50 MG tablet ?Commonly known as: Ultram ?Take 1 tablet (50 mg total) by mouth every 6 (six) hours as needed for severe pain. ?  ? ?  ? ?Allergies  ?Allergen Reactions  ? Ivp Dye [Iodinated Contrast Media] Anaphylaxis  ? Ciprofloxacin Diarrhea  ?  Nausea, vomiting   ? Codeine Sulfate [Codeine] Diarrhea  ?  Nausea, vomiting  ?  Meloxicam   ? Penicillins Nausea Only  ? Protonix [Pantoprazole Sodium] Hives  ? Shellfish Allergy Swelling  ? Sulfa Antibiotics Rash  ? ?Discharge Instructions   ? ? AMB  Referral to [facility]   Complete by: As directed ?  ? Call MD for:   Complete by: As directed ?  ? Any bleeding or bruises  ? Call MD for:  difficulty breathing, headache or visual disturbances   Complete by: As directed ?  ? Call MD for:  extreme fatigue   Complete by: As directed ?  ? Call MD for:  persistant dizziness or light-headedness   Complete by: As directed ?  ? Call MD for:  persistant nausea and vomiting   Complete by: As directed ?  ? Call MD for:  severe uncontrolled pain   Complete by: As directed ?  ? Diet - low sodium heart healthy   Complete by: As directed ?  ? Discharge instructions   Complete by: As directed ?  ? F/u PCP in  1 wk ?F/u vascular surgery in 3 weeks  ? Increase activity slowly   Complete by: As directed ?  ? ?   ? ? ?The results of significant diagnostics from this hospitalization (including imaging, microbiology, ancillary and laboratory) are listed below for reference.   ? ?Significant Diagnostic Studies: ?PER

## 2022-02-06 NOTE — Progress Notes (Signed)
?  ?  ?  Daily Progress Note ? ? ?Assessment/Planning:  ? ?POD #2 s/p PTA+S CA ? ?No complications at cannulation site ?F/U with Dr. Ronalee Belts as scheduled ?Continue Plavix ? ? ?Subjective  - 1 Day Post-Op  ? ?No complaints ? ? ?Objective  ? ?Vitals:  ? 02/05/22 1800 02/05/22 1939 02/06/22 0009 02/06/22 0428  ?BP: (!) 150/68 140/66 (!) 139/54 (!) 121/41  ?Pulse: 74 78 74 64  ?Resp: '18 18 17 16  '$ ?Temp: 98.4 ?F (36.9 ?C) 98.4 ?F (36.9 ?C) 98 ?F (36.7 ?C) 98 ?F (36.7 ?C)  ?TempSrc: Oral Oral  Oral  ?SpO2: 99% 99% 98% 98%  ?Weight:      ?Height:      ? ? ? ?Intake/Output Summary (Last 24 hours) at 02/06/2022 0820 ?Last data filed at 02/05/2022 2206 ?Gross per 24 hour  ?Intake 934.58 ml  ?Output --  ?Net 934.58 ml  ? ? ?VASC R groin dressing without active bleeding, no hematoma, no obvious thrill  ? ? ?Laboratory  ? ?CBC ? ?  Latest Ref Rng & Units 02/06/2022  ?  5:59 AM 02/05/2022  ?  9:15 AM 02/04/2022  ?  6:37 AM  ?CBC  ?WBC 4.0 - 10.5 K/uL 14.0   12.4   9.6    ?Hemoglobin 12.0 - 15.0 g/dL 10.3   10.9   10.9    ?Hematocrit 36.0 - 46.0 % 33.6   35.2   35.8    ?Platelets 150 - 400 K/uL 353   411   355    ? ? ?BMET ?   ?Component Value Date/Time  ? NA 139 02/06/2022 0559  ? NA 138 05/08/2014 0151  ? K 4.2 02/06/2022 0559  ? K 3.8 05/08/2014 0151  ? CL 108 02/06/2022 0559  ? CL 106 05/08/2014 0151  ? CO2 25 02/06/2022 0559  ? CO2 23 05/08/2014 0151  ? GLUCOSE 115 (H) 02/06/2022 0559  ? GLUCOSE 97 05/08/2014 0151  ? BUN 19 02/06/2022 0559  ? BUN 16 05/08/2014 0151  ? CREATININE 0.94 02/06/2022 0559  ? CREATININE 1.25 05/08/2014 0151  ? CALCIUM 9.8 02/06/2022 0559  ? CALCIUM 8.8 05/08/2014 0151  ? GFRNONAA >60 02/06/2022 0559  ? GFRNONAA 46 (L) 05/08/2014 0151  ? GFRAA 56 (L) 04/23/2018 0901  ? GFRAA 54 (L) 05/08/2014 0151  ? ? ? ?Adele Barthel, MD, FACS, FSVS ?Covering for [facility]: [phone] ? ?02/06/2022, 8:20 AM ? ? ? ? ? ? ?

## 2022-02-08 ENCOUNTER — Encounter: Payer: Self-pay | Admitting: Vascular Surgery

## 2022-03-09 ENCOUNTER — Encounter: Payer: Self-pay | Admitting: Pulmonary Disease

## 2022-03-09 ENCOUNTER — Ambulatory Visit (INDEPENDENT_AMBULATORY_CARE_PROVIDER_SITE_OTHER): Payer: 59 | Admitting: Pulmonary Disease

## 2022-03-09 VITALS — BP 132/80 | HR 77 | Temp 97.8°F | Ht 69.0 in | Wt 232.0 lb

## 2022-03-09 DIAGNOSIS — R918 Other nonspecific abnormal finding of lung field: Secondary | ICD-10-CM | POA: Diagnosis not present

## 2022-03-09 DIAGNOSIS — I771 Stricture of artery: Secondary | ICD-10-CM

## 2022-03-09 NOTE — Progress Notes (Signed)
Subjective:    Patient ID: Teresa Sparks, female    DOB: 08/13/1951, 71 y.o.   MRN: [id number] Patient Care Team: Maryland Pink, MD as PCP - General (Family Medicine)  Chief Complaint  Patient presents with   pulmonary consult    CT 02/04/2022-SOB with exertion.     HPI The patient is a 71 year old remote former smoker with minimal tobacco exposure in the past and with history as noted below who presents for evaluation of 2 nodular densities noted on CT scan of the chest.  She is kindly referred by Dr. Pryor Curia, her primary care physician is Dr. Maryland Pink.  Lesions in question were noted during imaging performed on 11 May when she presented with severe abdominal pain.  She had presented to [facility] with sudden onset of severe pulsating epigastric pain that radiated into the chest and the back.  She had some associated shortness of breath with this as well as nausea.  She was noted to be in the emergency at the time with blood pressure of 222/90.  Heart rate was 102.  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  A celiac artery stent was placed on 5/12 and patient was loaded with Plavix and started on Plavix 75 mg daily.  She is currently on aspirin and Plavix.  He was also started on Lipitor.  At a very minimum the patient will need to be on Plavix for 3 months before any interruption of therapy can be done for biopsies.  Explained to the patient.  I reviewed the imaging and question with the patient.  Currently is asymptomatic with regards to these findings.  She no longer has shortness of breath which she experienced initially on admission to [facility].  She has not had any cough or sputum production.  No hemoptysis.  Weight loss or anorexia.  She has been abdominal pain-free since started on antiplatelet medication and status post stent.  Review of Systems Past Medical History:  Diagnosis Date   Adenomatous colon polyp    Adrenal adenoma     Allergic genetic state    Arthritis    Chronic abdominal pain    GERD (gastroesophageal reflux disease)    Hiatal hernia    Hypertension    Past Surgical History:  Procedure Laterality Date   ABDOMINAL HYSTERECTOMY     CHOLECYSTECTOMY     COLONOSCOPY     COLONOSCOPY WITH PROPOFOL N/A 07/24/2018   Procedure: COLONOSCOPY WITH PROPOFOL;  Surgeon: Manya Silvas, MD;  Location: [facility] ENDOSCOPY;  Service: Endoscopy;  Laterality: N/A;   FRACTURE SURGERY     JOINT REPLACEMENT     6 years ago   VISCERAL ANGIOGRAPHY N/A 02/05/2022   Procedure: VISCERAL ANGIOGRAPHY;  Surgeon: Katha Cabal, MD;  Location: [place] [facility];  Service: Cardiovascular;  Laterality: N/A;   Patient Active Problem List   Diagnosis Date Noted   Celiac artery stenosis ([place]) 02/04/2022   Family History  Problem Relation Age of Onset   Hypertension Mother    Stroke Mother    Epilepsy Mother    Colon polyps Sister    Breast cancer Other    Social History   Tobacco Use   Smoking status: Former    Packs/day: 0.25    Years: 2.00    Total pack years: 0.50    Types: Cigarettes    Quit date: 1973    Years since quitting: 50.4   Smokeless  tobacco: Never  Substance Use Topics   Alcohol use: No   Allergies  Allergen Reactions   Ivp Dye [Iodinated Contrast Media] Anaphylaxis   Ciprofloxacin Diarrhea    Nausea, vomiting    Codeine Sulfate [Codeine] Diarrhea    Nausea, vomiting   Meloxicam    Penicillins Nausea Only   Protonix [Pantoprazole Sodium] Hives   Shellfish Allergy Swelling   Sulfa Antibiotics Rash   Current Meds  Medication Sig   acetaminophen (TYLENOL) 650 MG CR tablet Take 650 mg by mouth every 8 (eight) hours as needed for pain.   aspirin 81 MG chewable tablet Chew by mouth daily.   atorvastatin (LIPITOR) 10 MG tablet Take 1 tablet (10 mg total) by mouth daily at 6 PM.   cetirizine (ZYRTEC) 10 MG tablet Take 10 mg by mouth daily.   clopidogrel (PLAVIX) 75 MG tablet Take 1  tablet (75 mg total) by mouth daily with breakfast.   colchicine 0.6 MG tablet Take by mouth.   DULoxetine (CYMBALTA) 30 MG capsule Take 30 mg by mouth daily.   esomeprazole (NEXIUM) 40 MG capsule Take 1 capsule by mouth daily.   fluticasone (FLONASE) 50 MCG/ACT nasal spray Place into both nostrils daily.   gabapentin (NEURONTIN) 300 MG capsule Take 300 mg by mouth 2 (two) times daily.   hydrALAZINE (APRESOLINE) 50 MG tablet Take 50 mg by mouth 2 (two) times daily.   lisinopril-hydrochlorothiazide (ZESTORETIC) 10-12.5 MG tablet Take 1 tablet by mouth daily.   Metoprolol Succinate 25 MG CS24 Take by mouth daily.   Multiple Vitamins-Minerals (CENTRUM ADULTS PO) Take by mouth daily.   omeprazole (PRILOSEC) 40 MG capsule Take 40 mg by mouth 2 (two) times daily.   ondansetron (ZOFRAN) 4 MG tablet Take 1 tablet (4 mg total) by mouth daily as needed for nausea or vomiting.   PARoxetine (PAXIL) 10 MG tablet Take 1 tablet by mouth daily at 6 (six) AM.   traMADol (ULTRAM) 50 MG tablet Take 1 tablet (50 mg total) by mouth every 6 (six) hours as needed for severe pain.   [DISCONTINUED] ondansetron (ZOFRAN ODT) 4 MG disintegrating tablet Take 1 tablet (4 mg total) by mouth every 8 (eight) hours as needed for nausea or vomiting.   Immunization History  Administered Date(s) Administered   Influenza-Unspecified 07/06/2019   Moderna Sars-Covid-2 Vaccination 11/03/2019, 11/30/2019, 08/19/2020       Objective:   Physical Exam BP 132/80 (BP Location: Left Arm, Cuff Size: Large)   Pulse 77   Temp 97.8 F (36.6 C) (Temporal)   Ht '5\' 9"'$  (1.753 m)   Wt 232 lb (105.2 kg)   SpO2 98%   BMI 34.26 kg/m   SpO2: 98 % O2 Device: None (Room air)  GENERAL: Obese woman, no acute distress, HEAD: Normocephalic, atraumatic.  EYES: Pupils equal, round, reactive to light.  No scleral icterus.  MOUTH: Oral mucosa moist.  Teeth in poor repair.  No thrush. NECK: Supple. No thyromegaly. Trachea midline. No JVD.  No  adenopathy. PULMONARY: Good air entry bilaterally.  No adventitious sounds. CARDIOVASCULAR: S1 and S2. Regular rate and rhythm.  No rubs, murmurs or gallops heard. ABDOMEN: Benign. MUSCULOSKELETAL: No joint deformity, no clubbing, no edema.  NEUROLOGIC: No overt focal deficit, no gait disturbance, speech is fluent. SKIN: Intact,warm,dry. PSYCH: Mood and behavior normal.  Representative image from CT performed 04 Feb 2022 showing a 10 mm x 8 mm left upper lobe nodular opacity (arrow):   Representative image from CT performed 04 Feb 2022  showing a 14 x 10 mm irregular nodular density in the right upper lobe (arrow):     Assessment & Plan:     ICD-10-CM   1. Multiple lung nodules on CT  R91.8 CT SUPER D CHEST WO [facility] PILOT   Has 2 nodular densities concerning for malignancy Patient currently requires antiplatelet therapy that cannot be interrupted Repeat CT chest in 3 months    2. Celiac artery stenosis (HCC)  I77.1    On dual antiplatelet therapy Needs at least 3 months of uninterrupted therapy Cannot have biopsies until platelet therapy can be held     Orders Placed This Encounter  Procedures   CT SUPER D CHEST WO [facility] PILOT    Standing Status:   Future    Number of Occurrences:   1    Standing Expiration Date:   03/10/2023    Scheduling Instructions:     Around 05/07/2022    Order Specific Question:   Preferred imaging location?    Answer:   [facility]   The patient has 2 nodules that are concerning for malignancy.  However, she is currently on dual antiplatelet therapy and this cannot be interrupted due to recent stent to the celiac artery for severe celiac artery stenosis.  Will continue active surveillance and obtain a CT chest in 3 months time.  We will perform this with [facility] protocol anticipating need for biopsies.  Is to follow-up in 3 months time she is to contact us prior to that time should any new difficulties arise.  Renold Don, MD Advanced  Bronchoscopy PCCM [facility]    *This note was dictated using voice recognition software/Dragon.  Despite best efforts to proofread, errors can occur which can change the meaning. Any transcriptional errors that result from this process are unintentional and may not be fully corrected at the time of dictation.

## 2022-03-09 NOTE — Patient Instructions (Signed)
We are planning for you to have a special CT of the chest in 3 months time from the CT you had in May.  We will see you in follow-up after that CT is done.  Since you are on Plavix for a recently placed stent we need to wait 3 months anyway before anything else can be done.

## 2022-03-10 ENCOUNTER — Other Ambulatory Visit (INDEPENDENT_AMBULATORY_CARE_PROVIDER_SITE_OTHER): Payer: Self-pay | Admitting: Vascular Surgery

## 2022-03-10 DIAGNOSIS — Z95820 Peripheral vascular angioplasty status with implants and grafts: Secondary | ICD-10-CM

## 2022-03-10 DIAGNOSIS — K559 Vascular disorder of intestine, unspecified: Secondary | ICD-10-CM

## 2022-03-11 ENCOUNTER — Ambulatory Visit (INDEPENDENT_AMBULATORY_CARE_PROVIDER_SITE_OTHER): Payer: Self-pay

## 2022-03-11 ENCOUNTER — Ambulatory Visit (INDEPENDENT_AMBULATORY_CARE_PROVIDER_SITE_OTHER): Payer: 59 | Admitting: Vascular Surgery

## 2022-03-11 ENCOUNTER — Encounter (INDEPENDENT_AMBULATORY_CARE_PROVIDER_SITE_OTHER): Payer: Self-pay | Admitting: Vascular Surgery

## 2022-03-11 VITALS — BP 131/69 | HR 67 | Resp 16 | Wt 228.0 lb

## 2022-03-11 DIAGNOSIS — I771 Stricture of artery: Secondary | ICD-10-CM | POA: Diagnosis not present

## 2022-03-11 DIAGNOSIS — I10 Essential (primary) hypertension: Secondary | ICD-10-CM | POA: Insufficient documentation

## 2022-03-11 DIAGNOSIS — E782 Mixed hyperlipidemia: Secondary | ICD-10-CM

## 2022-03-11 DIAGNOSIS — Z95820 Peripheral vascular angioplasty status with implants and grafts: Secondary | ICD-10-CM

## 2022-03-11 DIAGNOSIS — E785 Hyperlipidemia, unspecified: Secondary | ICD-10-CM | POA: Insufficient documentation

## 2022-03-11 DIAGNOSIS — K219 Gastro-esophageal reflux disease without esophagitis: Secondary | ICD-10-CM | POA: Insufficient documentation

## 2022-03-11 DIAGNOSIS — K559 Vascular disorder of intestine, unspecified: Secondary | ICD-10-CM | POA: Diagnosis not present

## 2022-03-11 NOTE — Progress Notes (Signed)
MRN : [id number]  Teresa Sparks is a 71 y.o. ([birth year]) female who presents with chief complaint of check circulation.  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    She is s/p Celiac artery stent placement on 02/05/2022.  The patient states since the stent she denies abdominal pain or postprandial symptoms.  The patient denies weight loss as well as nausea.  The patient does not substantiate food fear, particular foods do not seem to aggravate or alleviate the symptoms.  The patient denies bloody bowel movements or diarrhea.    No history of peptic ulcer disease but she does have a history of GERD.   The patient denies amaurosis fugax or recent TIA symptoms. There are no recent neurological changes noted. The patient denies claudication symptoms or rest pain symptoms. The patient denies history of DVT, PE or superficial thrombophlebitis. The patient denies recent episodes of angina    Current Meds  Medication Sig   acetaminophen (TYLENOL) 650 MG CR tablet Take 650 mg by mouth every 8 (eight) hours as needed for pain.   aspirin 81 MG chewable tablet Chew by mouth daily.   atorvastatin (LIPITOR) 10 MG tablet Take 1 tablet (10 mg total) by mouth daily at 6 PM.   cetirizine (ZYRTEC) 10 MG tablet Take 10 mg by mouth daily.   clopidogrel (PLAVIX) 75 MG tablet Take 1 tablet (75 mg total) by mouth daily with breakfast.   colchicine 0.6 MG tablet Take by mouth.   DULoxetine (CYMBALTA) 30 MG capsule Take 30 mg by mouth daily.   esomeprazole (NEXIUM) 40 MG capsule Take 1 capsule by mouth daily.   famotidine (PEPCID) 20 MG tablet Take 1 tablet (20 mg total) by mouth daily for 10 days.   fluticasone (FLONASE) 50 MCG/ACT nasal spray Place into both nostrils daily.   gabapentin (NEURONTIN) 300 MG capsule Take 300 mg by mouth 2 (two) times daily.   hydrALAZINE  (APRESOLINE) 50 MG tablet Take 50 mg by mouth 2 (two) times daily.   lisinopril-hydrochlorothiazide (ZESTORETIC) 10-12.5 MG tablet Take 1 tablet by mouth daily.   Metoprolol Succinate 25 MG CS24 Take by mouth daily.   Multiple Vitamins-Minerals (CENTRUM ADULTS PO) Take by mouth daily.   omeprazole (PRILOSEC) 40 MG capsule Take 40 mg by mouth 2 (two) times daily.   ondansetron (ZOFRAN) 4 MG tablet Take 1 tablet (4 mg total) by mouth daily as needed for nausea or vomiting.   PARoxetine (PAXIL) 10 MG tablet Take 1 tablet by mouth daily at 6 (six) AM.   traMADol (ULTRAM) 50 MG tablet Take 1 tablet (50 mg total) by mouth every 6 (six) hours as needed for severe pain.    Past Medical History:  Diagnosis Date   Adenomatous colon polyp    Adrenal adenoma    Allergic genetic state    Arthritis    Chronic abdominal pain    GERD (gastroesophageal reflux disease)    Hiatal hernia    Hypertension     Past Surgical History:  Procedure Laterality Date   ABDOMINAL HYSTERECTOMY     CHOLECYSTECTOMY     COLONOSCOPY     COLONOSCOPY WITH PROPOFOL N/A 07/24/2018   Procedure: COLONOSCOPY WITH PROPOFOL;  Surgeon: Manya Silvas, MD;  Location: [facility] ENDOSCOPY;  Service: Endoscopy;  Laterality: N/A;   FRACTURE SURGERY     JOINT REPLACEMENT  6 years ago   VISCERAL ANGIOGRAPHY N/A 02/05/2022   Procedure: VISCERAL ANGIOGRAPHY;  Surgeon: Katha Cabal, MD;  Location: [place] [facility];  Service: Cardiovascular;  Laterality: N/A;    Social History Social History   Tobacco Use   Smoking status: Former    Packs/day: 0.25    Years: 2.00    Total pack years: 0.50    Types: Cigarettes    Quit date: 1973    Years since quitting: 50.4   Smokeless tobacco: Never  Vaping Use   Vaping Use: Never used  Substance Use Topics   Alcohol use: No   Drug use: Never    Family History Family History  Problem Relation Age of Onset   Hypertension Mother    Stroke Mother    Epilepsy Mother     Colon polyps Sister    Breast cancer Other     Allergies  Allergen Reactions   Iodinated Contrast Media Anaphylaxis    Other reaction(s): Vomiting   Ciprofloxacin Diarrhea and Nausea Only    Nausea, vomiting  Other reaction(s): Vomiting   Codeine Sulfate [Codeine] Diarrhea    Nausea, vomiting   Meloxicam    Penicillins Nausea Only   Protonix [Pantoprazole Sodium] Hives   Shellfish Allergy Swelling   Sulfa Antibiotics Rash     REVIEW OF SYSTEMS (Negative unless checked)  Constitutional: '[]'$ Weight loss  '[]'$ Fever  '[]'$ Chills Cardiac: '[]'$ Chest pain   '[]'$ Chest pressure   '[]'$ Palpitations   '[]'$ Shortness of breath when laying flat   '[]'$ Shortness of breath with exertion. Vascular:  '[]'$ Pain in legs with walking   '[]'$ Pain in legs at rest  '[]'$ History of DVT   '[]'$ Phlebitis   '[]'$ Swelling in legs   '[]'$ Varicose veins   '[]'$ Non-healing ulcers Pulmonary:   '[]'$ Uses home oxygen   '[]'$ Productive cough   '[]'$ Hemoptysis   '[]'$ Wheeze  '[]'$ COPD   '[]'$ Asthma Neurologic:  '[]'$ Dizziness   '[]'$ Seizures   '[]'$ History of stroke   '[]'$ History of TIA  '[]'$ Aphasia   '[]'$ Vissual changes   '[]'$ Weakness or numbness in arm   '[]'$ Weakness or numbness in leg Musculoskeletal:   '[]'$ Joint swelling   '[]'$ Joint pain   '[]'$ Low back pain Hematologic:  '[]'$ Easy bruising  '[]'$ Easy bleeding   '[]'$ Hypercoagulable state   '[]'$ Anemic Gastrointestinal:  '[]'$ Diarrhea   '[]'$ Vomiting  '[x]'$ Gastroesophageal reflux/heartburn   '[]'$ Difficulty swallowing. Genitourinary:  '[]'$ Chronic kidney disease   '[]'$ Difficult urination  '[]'$ Frequent urination   '[]'$ Blood in urine Skin:  '[]'$ Rashes   '[]'$ Ulcers  Psychological:  '[]'$ History of anxiety   '[]'$  History of major depression.  Physical Examination  Vitals:   03/11/22 0944  BP: 131/69  Pulse: 67  Resp: 16  Weight: 228 lb (103.4 kg)   Body mass index is 33.67 kg/m. Gen: WD/WN, NAD Head: [place]/AT, No temporalis wasting.  Ear/Nose/Throat: Hearing grossly intact, nares w/o erythema or drainage Eyes: PER, EOMI, sclera nonicteric.  Neck: Supple, no masses.  No bruit or  JVD.  Pulmonary:  Good air movement, no audible wheezing, no use of accessory muscles.  Cardiac: RRR, normal S1, S2, no Murmurs. Vascular:  mild trophic changes, no open wounds Vessel Right Left  Radial Palpable Palpable  Gastrointestinal: soft, non-distended. No guarding/no peritoneal signs.  Musculoskeletal: M/S 5/5 throughout.  No visible deformity.  Neurologic: CN 2-12 intact. Pain and light touch intact in extremities.  Symmetrical.  Speech is fluent. Motor exam as listed above. Psychiatric: Judgment intact, Mood & affect appropriate for pt's clinical situation. Dermatologic: No rashes or ulcers noted.  No changes consistent with cellulitis.  CBC Lab Results  Component Value Date   WBC 14.0 (H) 02/06/2022   HGB 10.3 (L) 02/06/2022   HCT 33.6 (L) 02/06/2022   MCV 76.5 (L) 02/06/2022   PLT 353 02/06/2022    BMET    Component Value Date/Time   NA 139 02/06/2022 0559   NA 138 05/08/2014 0151   K 4.2 02/06/2022 0559   K 3.8 05/08/2014 0151   CL 108 02/06/2022 0559   CL 106 05/08/2014 0151   CO2 25 02/06/2022 0559   CO2 23 05/08/2014 0151   GLUCOSE 115 (H) 02/06/2022 0559   GLUCOSE 97 05/08/2014 0151   BUN 19 02/06/2022 0559   BUN 16 05/08/2014 0151   CREATININE 0.94 02/06/2022 0559   CREATININE 1.25 05/08/2014 0151   CALCIUM 9.8 02/06/2022 0559   CALCIUM 8.8 05/08/2014 0151   GFRNONAA >60 02/06/2022 0559   GFRNONAA 46 (L) 05/08/2014 0151   GFRAA 56 (L) 04/23/2018 0901   GFRAA 54 (L) 05/08/2014 0151   CrCl cannot be calculated (Patient's most recent lab result is older than the maximum 21 days allowed.).  COAG No results found for: "INR", "PROTIME"  Radiology No results found.   Assessment/Plan 1. Mesenteric ischemia (HCC) Recommend:  The patient is status post successful angiogram with intervention of the mesenteric vessels.    The patient reports that the abdominal pain is improved and the post prandial symptoms are essentially gone.   The patient  denies lifestyle limiting changes at this point in time.  No further invasive studies, angiography or surgery at this time The patient should continue walking and begin a more formal exercise program.  The patient should continue antiplatelet therapy and aggressive treatment of the lipid abnormalities  Patient should undergo noninvasive studies as ordered. The patient will follow up with me after the studies.   - VAS [place] MESENTERIC - VAS [place] MESENTERIC; Future  2. Status post angioplasty with stent Recommend:  The patient is status post successful angiogram with intervention of the mesenteric vessels.    The patient reports that the abdominal pain is improved and the post prandial symptoms are essentially gone.   The patient denies lifestyle limiting changes at this point in time.  No further invasive studies, angiography or surgery at this time The patient should continue walking and begin a more formal exercise program.  The patient should continue antiplatelet therapy and aggressive treatment of the lipid abnormalities  Patient should undergo noninvasive studies as ordered. The patient will follow up with me after the studies.   - VAS [place] MESENTERIC  3. Celiac artery stenosis (HCC) Recommend:  The patient is status post successful angiogram with intervention of the mesenteric vessels.    The patient reports that the abdominal pain is improved and the post prandial symptoms are essentially gone.   The patient denies lifestyle limiting changes at this point in time.  No further invasive studies, angiography or surgery at this time The patient should continue walking and begin a more formal exercise program.  The patient should continue antiplatelet therapy and aggressive treatment of the lipid abnormalities  Patient should undergo noninvasive studies as ordered. The patient will follow up with me after the studies.   - VAS [place] MESENTERIC; Future  4. Essential  hypertension Continue antihypertensive medications as already ordered, these medications have been reviewed and there are no changes at this time.   5. Mixed hyperlipidemia Continue statin as ordered and reviewed, no changes at this time   6. Gastroesophageal  reflux disease, unspecified whether esophagitis present Continue PPI as already ordered, this medication has been reviewed and there are no changes at this time.  Avoidence of caffeine and alcohol  Moderate elevation of the head of the bed       Hortencia Pilar, MD  03/11/2022 9:50 AM

## 2022-05-07 ENCOUNTER — Ambulatory Visit
Admission: RE | Admit: 2022-05-07 | Discharge: 2022-05-07 | Disposition: A | Discharge status: Home / Self Care | Payer: 59 | Source: Ambulatory Visit | Attending: Pulmonary Disease | Admitting: Pulmonary Disease

## 2022-05-07 DIAGNOSIS — R918 Other nonspecific abnormal finding of lung field: Secondary | ICD-10-CM | POA: Diagnosis present

## 2022-05-11 ENCOUNTER — Other Ambulatory Visit: Payer: Self-pay

## 2022-05-11 DIAGNOSIS — R918 Other nonspecific abnormal finding of lung field: Secondary | ICD-10-CM

## 2022-05-26 ENCOUNTER — Ambulatory Visit
Admission: RE | Admit: 2022-05-26 | Discharge: 2022-05-26 | Disposition: A | Discharge status: Home / Self Care | Payer: 59 | Source: Ambulatory Visit | Attending: Pulmonary Disease | Admitting: Pulmonary Disease

## 2022-05-26 DIAGNOSIS — R918 Other nonspecific abnormal finding of lung field: Secondary | ICD-10-CM | POA: Diagnosis present

## 2022-05-26 DIAGNOSIS — D3502 Benign neoplasm of left adrenal gland: Secondary | ICD-10-CM | POA: Diagnosis not present

## 2022-05-26 LAB — GLUCOSE, CAPILLARY: Glucose-Capillary: 112 mg/dL — ABNORMAL HIGH (ref 70–99)

## 2022-05-26 MED ORDER — FLUDEOXYGLUCOSE F - 18 (FDG) INJECTION
11.8000 | Freq: Once | INTRAVENOUS | Status: AC | PRN
  Administered 2022-05-26: 12.49 via INTRAVENOUS

## 2022-05-28 ENCOUNTER — Other Ambulatory Visit: Payer: Self-pay

## 2022-05-28 DIAGNOSIS — R911 Solitary pulmonary nodule: Secondary | ICD-10-CM

## 2022-06-01 ENCOUNTER — Ambulatory Visit: Payer: 59 | Attending: Neurology

## 2022-06-01 DIAGNOSIS — G4733 Obstructive sleep apnea (adult) (pediatric): Secondary | ICD-10-CM | POA: Insufficient documentation

## 2022-06-09 ENCOUNTER — Ambulatory Visit (INDEPENDENT_AMBULATORY_CARE_PROVIDER_SITE_OTHER): Payer: 59 | Admitting: Pulmonary Disease

## 2022-06-09 ENCOUNTER — Encounter: Payer: Self-pay | Admitting: Pulmonary Disease

## 2022-06-09 VITALS — BP 120/70 | HR 69 | Temp 98.7°F | Ht 68.0 in | Wt 231.6 lb

## 2022-06-09 DIAGNOSIS — I771 Stricture of artery: Secondary | ICD-10-CM

## 2022-06-09 DIAGNOSIS — R918 Other nonspecific abnormal finding of lung field: Secondary | ICD-10-CM

## 2022-06-09 NOTE — Progress Notes (Signed)
Subjective:    Patient ID: Teresa Sparks, female    DOB: 1951-07-30, 71 y.o.   MRN: [id number] Patient Care Team: [place] Pink, MD as PCP - General (Family Medicine)  Chief Complaint  Patient presents with   Follow-up   HPI The patient is a 71 year old remote former smoker with minimal tobacco exposure in the past and with history as noted below who presents for follow-up of 2 nodular densities noted on CT scan of the chest.  She was initially evaluated on 09 March 2022, for the details of that evaluation please refer to that note.  He lesions in question were initially noted during imaging performed on 04 Feb 2022 when she presented with severe abdominal pain to [facility]. She had presented with sudden onset of severe pulsating epigastric pain that radiated into the chest and the back. She had some associated shortness of breath with this as well as nausea. She was noted to be in the emergency at the time with blood pressure of 222/90. Heart rate was 102. 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 A celiac artery stent was placed on 05 Feb 2022 and patient was loaded with Plavix and started on Plavix 75 mg daily. She is currently on aspirin and Plavix. He was also started on Lipitor.  After recent evaluation by her vascular surgeon, it is noted that the patient cannot be off of antiplatelet medications for any procedures until at least December.  The lesions are highly suspicious for low-grade adenocarcinomas and the patient should undergo biopsy of these lesions.  The preferred procedure would be robotic assisted navigational bronchoscopy given the location of the lesions.  The patient continues to be asymptomatic with regards to these findings however does remain somewhat anxious about the possibility that these could be cancer.  The patient underwent [facility] protocol CT in August with the thought that she would need biopsy at that time however given  that she needs a longer time of uninterrupted antiplatelet therapy she will need to be continued on active surveillance.  Review of Systems A 10 point review of systems was performed and it is as noted above otherwise negative.  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   Social History   Tobacco Use   Smoking status: Former    Packs/day: 0.25    Years: 2.00    Total pack years: 0.50    Types: Cigarettes    Quit date: 1973    Years since quitting: 50.7   Smokeless tobacco: Never  Substance Use Topics   Alcohol use: No   Allergies  Allergen Reactions   Iodinated Contrast Media Anaphylaxis    Other reaction(s): Vomiting   Ciprofloxacin Diarrhea and Nausea Only    Nausea, vomiting  Other reaction(s): Vomiting   Codeine Sulfate [Codeine] Diarrhea    Nausea, vomiting   Meloxicam    Penicillins Nausea Only   Protonix [Pantoprazole Sodium] Hives   Shellfish Allergy Swelling   Sulfa Antibiotics Rash   Current Meds  Medication Sig   acetaminophen (TYLENOL) 650 MG CR tablet Take 650 mg by mouth every 8 (eight) hours as needed for pain.   aspirin 81 MG chewable tablet Chew by mouth daily.   cetirizine (ZYRTEC) 10 MG tablet Take 10 mg by mouth daily.   clopidogrel (PLAVIX)  75 MG tablet Take 1 tablet (75 mg total) by mouth daily with breakfast.   colchicine 0.6 MG tablet Take by mouth.   DULoxetine (CYMBALTA) 30 MG capsule Take 30 mg by mouth daily.   esomeprazole (NEXIUM) 40 MG capsule Take 1 capsule by mouth daily.   fluticasone (FLONASE) 50 MCG/ACT nasal spray Place into both nostrils daily.   gabapentin (NEURONTIN) 300 MG capsule Take 300 mg by mouth 2 (two) times daily.   hydrALAZINE (APRESOLINE) 50 MG tablet Take 50 mg by mouth 2 (two) times daily.   lisinopril-hydrochlorothiazide (ZESTORETIC) 10-12.5 MG  tablet Take 1 tablet by mouth daily.   Metoprolol Succinate 25 MG CS24 Take by mouth daily.   Multiple Vitamins-Minerals (CENTRUM ADULTS PO) Take by mouth daily.   omeprazole (PRILOSEC) 40 MG capsule Take 40 mg by mouth 2 (two) times daily.   ondansetron (ZOFRAN) 4 MG tablet Take 1 tablet (4 mg total) by mouth daily as needed for nausea or vomiting.   PARoxetine (PAXIL) 10 MG tablet Take 1 tablet by mouth daily at 6 (six) AM.   traMADol (ULTRAM) 50 MG tablet Take 1 tablet (50 mg total) by mouth every 6 (six) hours as needed for severe pain.   Immunization History  Administered Date(s) Administered   Fluad Quad(high Dose 65+) 07/24/2021   Influenza-Unspecified 07/06/2019   Moderna Sars-Covid-2 Vaccination 11/03/2019, 11/30/2019, 08/19/2020      Objective:   Physical Exam BP 120/70 (BP Location: Left Arm, Patient Position: Sitting, Cuff Size: Large)   Pulse 69   Temp 98.7 F (37.1 C) (Oral)   Ht '5\' 8"'$  (1.727 m)   Wt 231 lb 9.6 oz (105.1 kg)   SpO2 97%   BMI 35.21 kg/m  GENERAL: Obese woman, no acute distress, fully ambulatory, no conversational dyspnea. HEAD: Normocephalic, atraumatic.  EYES: Pupils equal, round, reactive to light.  No scleral icterus.  MOUTH: Nose/mouth/throat not examined due to masking requirements for COVID 19.  Patient presented with mask today. NECK: Supple. No thyromegaly. Trachea midline. No JVD.  No adenopathy. PULMONARY: Good air entry bilaterally.  No adventitious sounds. CARDIOVASCULAR: S1 and S2. Regular rate and rhythm.  No rubs, murmurs or gallops heard. ABDOMEN: Obese, otherwise benign. MUSCULOSKELETAL: No joint deformity, no clubbing, no edema.  NEUROLOGIC: No overt focal deficit, no gait disturbance, speech is fluent. SKIN: Intact,warm,dry. PSYCH: Mood and behavior normal.  Representative images from CT performed 07 May 2022 showing the subsolid lesion on the left upper lobe 1.6 x 1.5 cm, and subsolid lesion in the right upper lobe abutting  the minor fissure measuring 1.4 x 1.3 cm:     These lesions did not exhibit hypermetabolism on PET/CT performed 26 May 2022 however, a low-grade adenocarcinoma cannot be excluded.    Assessment & Plan:     ICD-10-CM   1. Multiple lung nodules on CT  R91.8    Patient has subsolid lesion on the left upper lobe and right upper lobe These are suspicious for low-grade adenocarcinomas CT chest on DEC anticipating biopsy    2. Celiac artery stenosis (HCC)  I77.1    Must be on antiplatelet therapy Therapy cannot be held prior to December No biopsies until this therapy can be held     We have already scheduled CT to be done around the December timeframe.  The patient has 2 lesions suspicious for low-grade adenocarcinoma with dimensions as noted above.  The lesions are amenable to biopsy by robotic assisted navigational bronchoscopy.  Will see the patient in  follow-up after her next CT chest.  She is to contact us prior to that time should any new difficulties arise.  Renold Don, MD Advanced Bronchoscopy PCCM [facility]    *This note was dictated using voice recognition software/Dragon.  Despite best efforts to proofread, errors can occur which can change the meaning. Any transcriptional errors that result from this process are unintentional and may not be fully corrected at the time of dictation.

## 2022-06-09 NOTE — Patient Instructions (Signed)
Your PET/CT did not show any activity in the 2 spots in the lung that we have been following.  The follow-up CT scan did not show change in the size or shape of 2 spots.  You have another CT upcoming towards November December timeframe and I will call you closer to that time to schedule.  We will see you in follow-up after that CT is done (end of November or earlier part of December).  Your vascular surgeon has indicated that you cannot be taken off Plavix for procedures before December so this gives [place] time to observe these changes in your lung.

## 2022-08-02 NOTE — Progress Notes (Unsigned)
Referring Physician:  Hessie Knows, MD [address] [facility] [place],  [place] [address]  Primary Physician:  Teresa Pink, MD  History of Present Illness: 08/03/2022 Ms. Teresa Sparks has been following with PMR at [facility] (Teresa Sparks/Teresa Sparks).   She has known right foot drop (x 29 years after previous surgery)  She has  constant left sided LBP with radiation to her left lateral thigh (not past the knee). The entire left leg goes numb with prolonged standing/walking. No right sided pain. History of right TKA. She has some improvement with neurontin at night. She cannot walk 100 feet without stopping. Some improvement with grocery cart. She notes some weakness in left leg.   In last year, the ESIs have only helped her for a week or so. No recent PT.   She is on PLAVIX. She does not smoke.   Conservative measures:  Physical therapy: nothing recent  Multimodal medical therapy including regular antiinflammatories: neurontin, tylenol, flexeril, ultram.  Injections:  06/15/2022: Left L5-S1 transforaminal ESI 03/12/2022: Left L5-S1 transforaminal ESI (moderate relief, less effective) 12/08/2021: Left L5-S1 transforaminal ESI (good relief) 09/08/2021: Left L5-S1 transforaminal ESI (good relief) 05/21/2021: Bilateral L5-S1 transforaminal ESI (good relief) 04/23/2021: Left L5-S1 transforaminal ESI (80% relief) 09/06/2019: Left L5-S1 transforaminal ESI (55% relief) 01/25/2019: Right L5-S1 transforaminal ESI (good relief) 03/20/2018: Left L5-S1 transforaminal ESI (good relief) 10/04/2017: Left L5-S1 transforaminal ESI (moderate to good relief) 08/31/2017: Right L5-S1 transforaminal ESI (good relief)   Past Surgery: no spinal surgery  Teresa Sparks has no symptoms of cervical myelopathy.  The symptoms are causing a significant impact on the patient's life.   Review of Systems:  A 10 point review of systems is negative, except for the pertinent positives  and negatives detailed in the HPI.  Past Medical History: Past Medical History:  Diagnosis Date   Adenomatous colon polyp    Adrenal adenoma    Allergic genetic state    Arthritis    Chronic abdominal pain    GERD (gastroesophageal reflux disease)    Hiatal hernia    Hypertension     Past Surgical History: Past Surgical History:  Procedure Laterality Date   ABDOMINAL HYSTERECTOMY     CHOLECYSTECTOMY     COLONOSCOPY     COLONOSCOPY WITH PROPOFOL N/A 07/24/2018   Procedure: COLONOSCOPY WITH PROPOFOL;  Surgeon: Teresa Silvas, MD;  Location: [facility] ENDOSCOPY;  Service: Endoscopy;  Laterality: N/A;   FRACTURE SURGERY     JOINT REPLACEMENT     6 years ago   VISCERAL ANGIOGRAPHY N/A 02/05/2022   Procedure: VISCERAL ANGIOGRAPHY;  Surgeon: Teresa Cabal, MD;  Location: [facility];  Service: Cardiovascular;  Laterality: N/A;    Allergies: Allergies as of 08/03/2022 - Review Complete 08/03/2022  Allergen Reaction Noted   Iodinated contrast media Anaphylaxis 05/29/2014   Ciprofloxacin Diarrhea and Nausea Only 07/04/2014   Codeine sulfate [codeine] Diarrhea 07/21/2018   Meloxicam  07/21/2018   Penicillins Nausea Only 07/21/2018   Protonix [pantoprazole sodium] Hives 07/21/2018   Shellfish allergy Swelling 07/21/2018   Sulfa antibiotics Rash 08/08/2016    Medications: Outpatient Encounter Medications as of 08/03/2022  Medication Sig   acetaminophen (TYLENOL) 650 MG CR tablet Take 650 mg by mouth every 8 (eight) hours as needed for pain.   aspirin 81 MG chewable tablet Chew by mouth daily.   atorvastatin (LIPITOR) 10 MG tablet Take 1 tablet (10 mg total) by mouth daily at 6 PM.   cetirizine (ZYRTEC) 10  MG tablet Take 10 mg by mouth daily.   clopidogrel (PLAVIX) 75 MG tablet Take 1 tablet (75 mg total) by mouth daily with breakfast.   colchicine 0.6 MG tablet Take by mouth.   DULoxetine (CYMBALTA) 30 MG capsule Take 30 mg by mouth daily.   esomeprazole (NEXIUM) 40  MG capsule Take 1 capsule by mouth daily.   fluticasone (FLONASE) 50 MCG/ACT nasal spray Place into both nostrils daily.   gabapentin (NEURONTIN) 300 MG capsule Take 300 mg by mouth 2 (two) times daily.   hydrALAZINE (APRESOLINE) 50 MG tablet Take 50 mg by mouth 2 (two) times daily.   lisinopril-hydrochlorothiazide (ZESTORETIC) 10-12.5 MG tablet Take 1 tablet by mouth daily.   Metoprolol Succinate 25 MG CS24 Take by mouth daily.   Multiple Vitamins-Minerals (CENTRUM ADULTS PO) Take by mouth daily.   omeprazole (PRILOSEC) 40 MG capsule Take 40 mg by mouth 2 (two) times daily.   ondansetron (ZOFRAN) 4 MG tablet Take 1 tablet (4 mg total) by mouth daily as needed for nausea or vomiting.   PARoxetine (PAXIL) 10 MG tablet Take 1 tablet by mouth daily at 6 (six) AM.   traMADol (ULTRAM) 50 MG tablet Take 1 tablet (50 mg total) by mouth every 6 (six) hours as needed for severe pain.   famotidine (PEPCID) 20 MG tablet Take 1 tablet (20 mg total) by mouth daily for 10 days.   No facility-administered encounter medications on file as of 08/03/2022.    Social History: Social History   Tobacco Use   Smoking status: Former    Packs/day: 0.25    Years: 2.00    Total pack years: 0.50    Types: Cigarettes    Quit date: 1973    Years since quitting: 50.8   Smokeless tobacco: Never  Vaping Use   Vaping Use: Never used  Substance Use Topics   Alcohol use: No   Drug use: Never    Family Medical History: Family History  Problem Relation Age of Onset   Hypertension Mother    Stroke Mother    Epilepsy Mother    Colon polyps Sister    Breast cancer Other     Physical Examination: Vitals:   08/03/22 1002  BP: 128/78    General: Patient is well developed, well nourished, calm, collected, and in no apparent distress. Attention to examination is appropriate.  Respiratory: Patient is breathing without any difficulty.   NEUROLOGICAL:     Awake, alert, oriented to person, place, and time.   Speech is clear and fluent. Fund of knowledge is appropriate.   Cranial Nerves: Pupils equal round and reactive to light.  Facial tone is symmetric.  Facial sensation is symmetric.  ROM of spine:  Limited ROM of lumbar spine with pain on extension  No abnormal lesions on exposed skin.   Strength: Side Biceps Triceps Deltoid Interossei Grip Wrist Ext. Wrist Flex.  R '5 5 5 5 5 5 5  '$ L '5 5 5 5 5 5 5   '$ Side Iliopsoas Quads Hamstring PF DF EHL  R '5 5 5 5 3 3  '$ L '5 5 5 5 5 5   '$ She has known chronic right foot drop.   Reflexes are 2+ and symmetric at the biceps, triceps, brachioradialis, patella and achilles.   Hoffman's is absent.  Clonus is not present.   Bilateral upper and lower extremity sensation is intact to light touch.     She walks with limp favoring left leg.   Medical  Decision Making  Imaging: MRI lumbar spine dated 03/25/21:  FINDINGS: Segmentation: Standard. The inferior-most fully formed intervertebral disc is labeled L5-S1.   Alignment:  Slight (grade 1) anterolisthesis of L3 on L4, similar.   Vertebrae: Vertebral body heights are maintained. No specific evidence of acute fracture, discitis/osteomyelitis, or suspicious bone lesion. 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   Conus medullaris and cauda equina: Conus extends to the T11-T12 level. The conus is partially imaged. The visualized portion of the conus is unremarkable. Redundant cauda equina nerve roots in the upper lumbar canal, likely related to severe L2-L3 canal stenosis described below.   Paraspinal and other soft tissues: Bilateral renal cysts.   Disc levels:   T12-L1: No significant disc protrusion, foraminal stenosis, or canal stenosis.   L1-L2: Mild disc bulging and mild bilateral facet hypertrophy without significant canal or foraminal stenosis.   L2-L3: Increased size of a broad disc bulge with moderate left greater than right facet hypertrophy and ligamentum flavum thickening.  Progressive canal stenosis, now severe. Similar mild left greater than right foraminal stenosis.   L3-L4: Similar mild (grade 1) anterolisthesis of L3 on L4. Slightly increased broad disc bulge and moderate bilateral facet hypertrophy with ligamentum flavum thickening. Similar left far lateral/extraforaminal disc protrusion which contacts and mildly displaces the exiting/exited left L3 nerve. Similar moderate left foraminal/extraforaminal stenosis. Similar mild right foraminal stenosis. Slightly progressed mild to moderate canal and bilateral subarticular recess stenosis.   L4-L5: Similar broad disc bulge with endplate spurring. Similar moderate bilateral facet hypertrophy and ligamentum flavum thickening. Similar mild-to-moderate canal and bilateral subarticular recess stenosis. Similar moderate right greater than left foraminal stenosis.   L5-S1: Mild bilateral facet hypertrophy without significant canal or foraminal stenosis.   IMPRESSION: 1. At L2-L3, severe canal stenosis that has progressed from the prior. Similar mild left greater than right foraminal stenosis. 2. At L3-L4, mild-to-moderate canal and bilateral subarticular recess stenosis that is slightly progressed. Similar moderate left foraminal stenosis with left far lateral/extraforaminal disc contacting and mildly displacing the exiting/exited left L3 nerve. 3. At L4-L5, similar mild to moderate canal and bilateral subarticular recess stenosis and moderate right greater than left foraminal stenosis.     Electronically Signed   By: Margaretha Sheffield MD   On: 03/26/2021 07:48        I have personally reviewed the images and agree with the above interpretation.  Assessment and Plan: Teresa Sparks is a pleasant 71 y.o. female with onstant left sided LBP with radiation to her left lateral thigh (not past the knee). The entire left leg goes numb with prolonged standing/walking. She cannot walk 100 feet without  stopping. Some improvement with grocery cart.   She has chronic right foot drop x years.   Known severe central stenosis L2-L3 with mild/moderate central stenosis L3-L5 from MRI done over a year ago. Symptoms are consistent with spinal stenosis.   Treatment options discussed with patient and following plan made:   - Updated lumbar MRI to be done at [facility] to further evaluate left lumbar radiculopathy and spinal stenosis. She has failed conservative treatments including injections, time, and medications.  - PT for lumbar spine. Orders to [facility] in [place].  - Continue neurontin from other providers. Will check with PCP about taking naproxen with plavix.  - Will set up phone visit to review lumbar MRI and likely will get her into see Dr. Izora Ribas to discuss surgery options after she's completed 6 weeks of PT.   I spent a total  of 30 minutes in face-to-face and non-face-to-face activities related to this patient's care toda including review of outside records, review of imaging, review of symptoms, physical exam, discussion of differential diagnosis, discussion of treatment options, and documentation.   Thank you for involving me in the care of this patient.   Teresa Boot PA-C Dept. of Neurosurgery

## 2022-08-03 ENCOUNTER — Ambulatory Visit (INDEPENDENT_AMBULATORY_CARE_PROVIDER_SITE_OTHER): Payer: 59 | Admitting: Orthopedic Surgery

## 2022-08-03 ENCOUNTER — Encounter: Payer: Self-pay | Admitting: Orthopedic Surgery

## 2022-08-03 VITALS — BP 128/78 | Ht 68.0 in | Wt 228.0 lb

## 2022-08-03 DIAGNOSIS — M21371 Foot drop, right foot: Secondary | ICD-10-CM | POA: Diagnosis not present

## 2022-08-03 DIAGNOSIS — M5416 Radiculopathy, lumbar region: Secondary | ICD-10-CM

## 2022-08-03 DIAGNOSIS — M48062 Spinal stenosis, lumbar region with neurogenic claudication: Secondary | ICD-10-CM | POA: Diagnosis not present

## 2022-08-03 NOTE — Patient Instructions (Signed)
It was so nice to see you today, I am sorry that you are hurting so much.   Your MRI from last year showed wear and tear in your back (arthritis) with spinal stenosis (pressure on the spinal cord).   I want to get an updated lumbar MRI scan to look into things further.   I sent physical therapy orders to [facility], they should call you to schedule. You can call them at [phone].   Ask your PCP about taking naproxen with the plavix.   We will set up a phone call visit to review your MRI results once I have them back.   If you have not heard back about any of the tests/procedures in the next week, please call the office so we can help you get these things scheduled.   Geronimo Boot PA-C [phone]

## 2022-08-05 ENCOUNTER — Other Ambulatory Visit (INDEPENDENT_AMBULATORY_CARE_PROVIDER_SITE_OTHER): Payer: Self-pay

## 2022-08-05 MED ORDER — CLOPIDOGREL BISULFATE 75 MG PO TABS: 75.0000 mg | ORAL_TABLET | Freq: Every day | ORAL | 5 refills | Status: DC

## 2022-08-16 ENCOUNTER — Ambulatory Visit
Admission: RE | Admit: 2022-08-16 | Discharge: 2022-08-16 | Disposition: A | Discharge status: Home / Self Care | Payer: 59 | Source: Ambulatory Visit | Attending: Orthopedic Surgery | Admitting: Orthopedic Surgery

## 2022-08-16 DIAGNOSIS — M48062 Spinal stenosis, lumbar region with neurogenic claudication: Secondary | ICD-10-CM | POA: Diagnosis present

## 2022-08-16 DIAGNOSIS — M5416 Radiculopathy, lumbar region: Secondary | ICD-10-CM | POA: Insufficient documentation

## 2022-08-23 ENCOUNTER — Ambulatory Visit (INDEPENDENT_AMBULATORY_CARE_PROVIDER_SITE_OTHER): Payer: 59 | Admitting: Orthopedic Surgery

## 2022-08-23 ENCOUNTER — Encounter: Payer: Self-pay | Admitting: Orthopedic Surgery

## 2022-08-23 DIAGNOSIS — M5416 Radiculopathy, lumbar region: Secondary | ICD-10-CM | POA: Diagnosis not present

## 2022-08-23 DIAGNOSIS — M48062 Spinal stenosis, lumbar region with neurogenic claudication: Secondary | ICD-10-CM | POA: Diagnosis not present

## 2022-08-23 NOTE — Progress Notes (Signed)
Telephone Visit- Progress Note: Referring Physician:  No referring provider defined for this encounter.  Primary Physician:  Teresa Pink, MD  This visit was performed via telephone.  Patient location: home Provider location: office  I spent a total of 10 minutes non-face-to-face activities for this visit on the date of this encounter including review of current clinical condition and response to treatment.   Chief Complaint:  review of lumbar MRI  History of Present Illness: Teresa Sparks is a 71 y.o. female who is being seen for telephone visit to review her lumbar MRI.   Lat seen by me on 08/03/22 for constant left sided LBP with radiation to her left lateral thigh (not past the knee). The entire left leg goes numb with prolonged standing/walking. No right sided pain.   She was sent to PT at her last visit. She has done 3 visits so far and feels great! She has only minimal intermittent left leg pain with prolonged standing. She has no LBP. Overall, she is doing well.   Most recent ESIs have only helped for a week or so. She is on PLAVIX.    Conservative measures:  Physical therapy: has done 3 visits at [facility] in [place] Multimodal medical therapy including regular antiinflammatories: neurontin, tylenol, flexeril, ultram.  Injections:  06/15/2022: Left L5-S1 transforaminal ESI 03/12/2022: Left L5-S1 transforaminal ESI (moderate relief, less effective) 12/08/2021: Left L5-S1 transforaminal ESI (good relief) 09/08/2021: Left L5-S1 transforaminal ESI (good relief) 05/21/2021: Bilateral L5-S1 transforaminal ESI (good relief) 04/23/2021: Left L5-S1 transforaminal ESI (80% relief) 09/06/2019: Left L5-S1 transforaminal ESI (55% relief) 01/25/2019: Right L5-S1 transforaminal ESI (good relief) 03/20/2018: Left L5-S1 transforaminal ESI (good relief) 10/04/2017: Left L5-S1 transforaminal ESI (moderate to good relief) 08/31/2017: Right L5-S1 transforaminal ESI (good  relief)    Past Surgery: no spinal surgery  Exam: No exam done as this was a telephone encounter.     Imaging: MRI of lumbar spine dated 08/16/22:  FINDINGS: Segmentation:  Standard.   Alignment:  Trace anterolisthesis L3 on L4 is unchanged.   Vertebrae:  No fracture, evidence of discitis, or bone lesion.   Conus medullaris and cauda equina: Conus extends to the T11-12 level. Conus appears normal. Buckling of nerve roots superior to the L2-3 level is unchanged.   Paraspinal and other soft tissues: Negative.   Disc levels:   T11-12 is imaged in the sagittal plane only and negative.   T12-L1: Negative.   L1-2: Mild facet degenerative change. Minimal disc bulge. No stenosis.   L2-3: Moderate facet arthropathy, ligamentum flavum thickening and a broad-based disc bulge cause moderately severe to severe central canal stenosis with narrowing in the subarticular recesses. Mild bilateral foraminal narrowing again seen. No change.   L3-4: Moderate facet degenerative change, shallow broad-based disc bulge and mild ligamentum flavum thickening again seen. Extraforaminal disc protrusion on the left is also again seen. There is moderate central canal stenosis. The patient's disc protrusion impinges on the exiting and exited left L3 root. There is mild right foraminal stenosis.   L4-5: Disc bulge, mild facet arthropathy and ligamentum flavum thickening. Bilateral subarticular recess narrowing and right greater than left moderate foraminal stenosis again seen. No change.   L5-S1: Negative.   IMPRESSION: No change in the appearance of the lumbar spine since the prior exam.   Moderately severe to severe central canal stenosis and narrowing in the subarticular recess at L2-3.   Extraforaminal protrusion on the left at L3-4 impinges on the exiting and exited left L3  root. There is moderate central canal stenosis at L3-4.   No change in narrowing in the subarticular recesses  and right greater than left moderate foraminal stenosis at L4-5.     Electronically Signed   By: Inge Rise M.D.   On: 08/17/2022 09:08    I have personally reviewed the images and agree with the above interpretation.  Assessment and Plan: Teresa Sparks is a pleasant 71 y.o. female with great improvement in symptoms with PT. She has only intermittent minimal left leg pain with prolonged standing. No LBP.   Chronic right foot drop x years.   She has known moderate/severe central stenosis L2-L3 with moderate central stenosis L3-L4 along with left sided foraminal disc.   Treatment options discussed with patient and following plan made:   - Continue with PT as she is doing so well.  - Continue on neurontin from other providers.  - Of note, she is on PLAVIX.  - Hold on surgery options as she is improved. Can revisit if symptoms get worse.  - Follow up with me in 6-8 weeks. Call with concerns.   ADDENDUM 08/25/22:  Above lumbar MRI reviewed with Dr. Izora Ribas regarding "buckling of nerve roots superior to L2-L3." No concerning characteristics seen and this may be due to surrounding stenosis. No change to above plan.   Teresa Boot PA-C Neurosurgery

## 2022-09-05 NOTE — Progress Notes (Unsigned)
MRN : [id number]  Teresa Sparks is a 71 y.o. ([birth year]) female who presents with chief complaint of check circulation.  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     She is s/p Celiac artery stent placement on 02/05/2022. The SMA is patent without hemodynamically significant stenosis.   The patient states since the stent she denies abdominal pain or postprandial symptoms.  The patient denies weight loss as well as nausea.  The patient does not substantiate food fear, particular foods do not seem to aggravate or alleviate the symptoms.  The patient denies bloody bowel movements or diarrhea.     No history of peptic ulcer disease but she does have a history of GERD.    The patient denies amaurosis fugax or recent TIA symptoms. There are no recent neurological changes noted. The patient denies claudication symptoms or rest pain symptoms. The patient denies history of DVT, PE or superficial thrombophlebitis. The patient denies recent episodes of angina.  Duplex ultrasound of the mesenteric arteries shows widely patent celiac stent SMA is widely patent as well   No outpatient medications have been marked as taking for the 09/06/22 encounter (Appointment) with Delana Meyer, Dolores Lory, MD.    Past Medical History:  Diagnosis Date   Adenomatous colon polyp    Adrenal adenoma    Allergic genetic state    Arthritis    Chronic abdominal pain    GERD (gastroesophageal reflux disease)    Hiatal hernia    Hypertension     Past Surgical History:  Procedure Laterality Date   ABDOMINAL HYSTERECTOMY     CHOLECYSTECTOMY     COLONOSCOPY     COLONOSCOPY WITH PROPOFOL N/A 07/24/2018   Procedure: COLONOSCOPY WITH PROPOFOL;  Surgeon: Manya Silvas, MD;  Location: [facility] ENDOSCOPY;  Service: Endoscopy;  Laterality: N/A;   FRACTURE SURGERY     JOINT  REPLACEMENT     6 years ago   VISCERAL ANGIOGRAPHY N/A 02/05/2022   Procedure: VISCERAL ANGIOGRAPHY;  Surgeon: Katha Cabal, MD;  Location: [facility];  Service: Cardiovascular;  Laterality: N/A;    Social History Social History   Tobacco Use   Smoking status: Former    Packs/day: 0.25    Years: 2.00    Total pack years: 0.50    Types: Cigarettes    Quit date: 1973    Years since quitting: 50.9   Smokeless tobacco: Never  Vaping Use   Vaping Use: Never used  Substance Use Topics   Alcohol use: No   Drug use: Never    Family History Family History  Problem Relation Age of Onset   Hypertension Mother    Stroke Mother    Epilepsy Mother    Colon polyps Sister    Breast cancer Other     Allergies  Allergen Reactions   Iodinated Contrast Media Anaphylaxis    Other reaction(s): Vomiting   Ciprofloxacin Diarrhea and Nausea Only    Nausea, vomiting  Other reaction(s): Vomiting   Codeine Sulfate [Codeine] Diarrhea    Nausea, vomiting   Meloxicam    Penicillins Nausea Only   Protonix [Pantoprazole Sodium] Hives   Shellfish Allergy Swelling   Sulfa Antibiotics Rash     REVIEW OF SYSTEMS (Negative unless checked)  Constitutional: '[]'$ Weight loss  '[]'$ Fever  '[]'$ Chills Cardiac: '[]'$   Chest pain   '[]'$ Chest pressure   '[]'$ Palpitations   '[]'$ Shortness of breath when laying flat   '[]'$ Shortness of breath with exertion. Vascular:  '[x]'$ Pain in legs with walking   '[]'$ Pain in legs at rest  '[]'$ History of DVT   '[]'$ Phlebitis   '[]'$ Swelling in legs   '[]'$ Varicose veins   '[]'$ Non-healing ulcers Pulmonary:   '[]'$ Uses home oxygen   '[]'$ Productive cough   '[]'$ Hemoptysis   '[]'$ Wheeze  '[]'$ COPD   '[]'$ Asthma Neurologic:  '[]'$ Dizziness   '[]'$ Seizures   '[]'$ History of stroke   '[]'$ History of TIA  '[]'$ Aphasia   '[]'$ Vissual changes   '[]'$ Weakness or numbness in arm   '[]'$ Weakness or numbness in leg Musculoskeletal:   '[]'$ Joint swelling   '[]'$ Joint pain   '[]'$ Low back pain Hematologic:  '[]'$ Easy bruising  '[]'$ Easy bleeding   '[]'$ Hypercoagulable  state   '[]'$ Anemic Gastrointestinal:  '[]'$ Diarrhea   '[]'$ Vomiting  '[]'$ Gastroesophageal reflux/heartburn   '[]'$ Difficulty swallowing. Genitourinary:  '[]'$ Chronic kidney disease   '[]'$ Difficult urination  '[]'$ Frequent urination   '[]'$ Blood in urine Skin:  '[]'$ Rashes   '[]'$ Ulcers  Psychological:  '[]'$ History of anxiety   '[]'$  History of major depression.  Physical Examination  There were no vitals filed for this visit. There is no height or weight on file to calculate BMI. Gen: WD/WN, NAD Head: [place]/AT, No temporalis wasting.  Ear/Nose/Throat: Hearing grossly intact, nares w/o erythema or drainage Eyes: PER, EOMI, sclera nonicteric.  Neck: Supple, no masses.  No bruit or JVD.  Pulmonary:  Good air movement, no audible wheezing, no use of accessory muscles.  Cardiac: RRR, normal S1, S2, no Murmurs. Vascular:  mild trophic changes, no open wounds Vessel Right Left  Radial Palpable Palpable  Gastrointestinal: soft, non-distended. No guarding/no peritoneal signs.  Musculoskeletal: M/S 5/5 throughout.  No visible deformity.  Neurologic: CN 2-12 intact. Pain and light touch intact in extremities.  Symmetrical.  Speech is fluent. Motor exam as listed above. Psychiatric: Judgment intact, Mood & affect appropriate for pt's clinical situation. Dermatologic: No rashes or ulcers noted.  No changes consistent with cellulitis.   CBC Lab Results  Component Value Date   WBC 14.0 (H) 02/06/2022   HGB 10.3 (L) 02/06/2022   HCT 33.6 (L) 02/06/2022   MCV 76.5 (L) 02/06/2022   PLT 353 02/06/2022    BMET    Component Value Date/Time   NA 139 02/06/2022 0559   NA 138 05/08/2014 0151   K 4.2 02/06/2022 0559   K 3.8 05/08/2014 0151   CL 108 02/06/2022 0559   CL 106 05/08/2014 0151   CO2 25 02/06/2022 0559   CO2 23 05/08/2014 0151   GLUCOSE 115 (H) 02/06/2022 0559   GLUCOSE 97 05/08/2014 0151   BUN 19 02/06/2022 0559   BUN 16 05/08/2014 0151   CREATININE 0.94 02/06/2022 0559   CREATININE 1.25 05/08/2014 0151   CALCIUM  9.8 02/06/2022 0559   CALCIUM 8.8 05/08/2014 0151   GFRNONAA >60 02/06/2022 0559   GFRNONAA 46 (L) 05/08/2014 0151   GFRAA 56 (L) 04/23/2018 0901   GFRAA 54 (L) 05/08/2014 0151   CrCl cannot be calculated (Patient's most recent lab result is older than the maximum 21 days allowed.).  COAG No results found for: "INR", "PROTIME"  Radiology MR LUMBAR SPINE WO CONTRAST  Result Date: 08/17/2022 CLINICAL DATA:  Chronic low back and left leg pain. EXAM: MRI LUMBAR SPINE WITHOUT CONTRAST TECHNIQUE: Multiplanar, multisequence MR imaging of the lumbar spine was performed. No intravenous contrast was administered. COMPARISON:  MRI lumbar spine 03/25/2021. FINDINGS: Segmentation:  Standard. Alignment:  Trace anterolisthesis L3 on L4 is unchanged. Vertebrae:  No fracture, evidence of discitis, or bone lesion. Conus medullaris and cauda equina: Conus extends to the T11-12 level. Conus appears normal. Buckling of nerve roots superior to the L2-3 level is unchanged. Paraspinal and other soft tissues: Negative. Disc levels: T11-12 is imaged in the sagittal plane only and negative. T12-L1: Negative. L1-2: Mild facet degenerative change. Minimal disc bulge. No stenosis. L2-3: Moderate facet arthropathy, ligamentum flavum thickening and a broad-based disc bulge cause moderately severe to severe central canal stenosis with narrowing in the subarticular recesses. Mild bilateral foraminal narrowing again seen. No change. L3-4: Moderate facet degenerative change, shallow broad-based disc bulge and mild ligamentum flavum thickening again seen. Extraforaminal disc protrusion on the left is also again seen. There is moderate central canal stenosis. The patient's disc protrusion impinges on the exiting and exited left L3 root. There is mild right foraminal stenosis. L4-5: Disc bulge, mild facet arthropathy and ligamentum flavum thickening. Bilateral subarticular recess narrowing and right greater than left moderate foraminal  stenosis again seen. No change. L5-S1: Negative. IMPRESSION: No change in the appearance of the lumbar spine since the prior exam. Moderately severe to severe central canal stenosis and narrowing in the subarticular recess at L2-3. Extraforaminal protrusion on the left at L3-4 impinges on the exiting and exited left L3 root. There is moderate central canal stenosis at L3-4. No change in narrowing in the subarticular recesses and right greater than left moderate foraminal stenosis at L4-5. Electronically Signed   By: Inge Rise M.D.   On: 08/17/2022 09:08     Assessment/Plan 1. Celiac artery stenosis (HCC) Recommend:   The patient is status post successful angiogram with intervention of the mesenteric vessels.     The patient reports that the abdominal pain is improved and the post prandial symptoms are essentially gone.   The patient denies lifestyle limiting changes at this point in time.   No further invasive studies, angiography or surgery at this time The patient should continue walking and begin a more formal exercise program.  The patient should continue antiplatelet therapy and aggressive treatment of the lipid abnormalities   Patient should undergo noninvasive studies in 12 months as ordered. The patient will follow up with me after the studies.   - VAS [place] MESENTERIC - VAS [place] MESENTERIC; Future  2. Essential hypertension Continue antihypertensive medications as already ordered, these medications have been reviewed and there are no changes at this time.  3. Gastroesophageal reflux disease, unspecified whether esophagitis present Continue PPI as already ordered, this medication has been reviewed and there are no changes at this time.  Avoidence of caffeine and alcohol  Moderate elevation of the head of the bed   4. Mixed hyperlipidemia Continue statin as ordered and reviewed, no changes at this time  5. Mesenteric ischemia (HCC) Recommend:   The patient is status post  successful angiogram with intervention of the mesenteric vessels.     The patient reports that the abdominal pain is improved and the post prandial symptoms are essentially gone.   The patient denies lifestyle limiting changes at this point in time.   No further invasive studies, angiography or surgery at this time The patient should continue walking and begin a more formal exercise program.  The patient should continue antiplatelet therapy and aggressive treatment of the lipid abnormalities   Patient should undergo noninvasive studies in 12 months as ordered. The patient will follow up with me after  the studies.  - VAS [place] MESENTERIC - VAS [place] MESENTERIC; Future    Hortencia Pilar, MD  09/05/2022 2:48 PM

## 2022-09-06 ENCOUNTER — Ambulatory Visit (INDEPENDENT_AMBULATORY_CARE_PROVIDER_SITE_OTHER): Payer: 59 | Admitting: Vascular Surgery

## 2022-09-06 ENCOUNTER — Ambulatory Visit (INDEPENDENT_AMBULATORY_CARE_PROVIDER_SITE_OTHER): Payer: 59

## 2022-09-06 ENCOUNTER — Encounter (INDEPENDENT_AMBULATORY_CARE_PROVIDER_SITE_OTHER): Payer: Self-pay | Admitting: Vascular Surgery

## 2022-09-06 VITALS — BP 139/80 | HR 82 | Resp 16 | Wt 233.0 lb

## 2022-09-06 DIAGNOSIS — I771 Stricture of artery: Secondary | ICD-10-CM

## 2022-09-06 DIAGNOSIS — K559 Vascular disorder of intestine, unspecified: Secondary | ICD-10-CM | POA: Diagnosis not present

## 2022-09-06 DIAGNOSIS — K219 Gastro-esophageal reflux disease without esophagitis: Secondary | ICD-10-CM

## 2022-09-06 DIAGNOSIS — I10 Essential (primary) hypertension: Secondary | ICD-10-CM

## 2022-09-06 DIAGNOSIS — E782 Mixed hyperlipidemia: Secondary | ICD-10-CM | POA: Diagnosis not present

## 2022-09-10 ENCOUNTER — Telehealth: Payer: Self-pay | Admitting: Pulmonary Disease

## 2022-09-10 NOTE — Telephone Encounter (Signed)
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  Her procedure had to be delayed because of her need for anticoagulants that could not be changed.  All of the reports today that this is highly suspicious for adenocarcinoma waiting a year for imaging would be malpractice.

## 2022-09-10 NOTE — Telephone Encounter (Signed)
The patient has 2 insurances and [employer] plan denied the approval for CT scheduled on 08/25/22 stating the spots on lungs should be follow up in 1 year. The denial letter was faxed to [phone] something not sure where

## 2022-09-13 ENCOUNTER — Encounter: Payer: Self-pay | Admitting: Pulmonary Disease

## 2022-09-14 NOTE — Telephone Encounter (Signed)
I have faxed records to do a Provider Courtesy Review for this patient

## 2022-09-16 NOTE — Telephone Encounter (Signed)
I will not be able to do it today probably tomorrow

## 2022-09-16 NOTE — Telephone Encounter (Signed)
I just got off the phone with [place]lon that does PA for [facility] and the order is still pending even after doing the provider courtesy review/ appeal.  They did offer Peer to Peer they don't scheduled we just call and you speak with nurse/ provider at [facility]. Would you be able to do the Peer to Peer

## 2022-09-17 ENCOUNTER — Telehealth: Payer: Self-pay

## 2022-09-17 NOTE — Telephone Encounter (Signed)
CT has been denied.  Per Dr. Patsey Berthold, schedule her an appointment to come in an be seen, so we can update her status. If she is cleared to schedule the Bronch we can order the CT after it is scheduled.

## 2022-09-17 NOTE — Telephone Encounter (Signed)
LM x1 for patient.  

## 2022-09-17 NOTE — Telephone Encounter (Signed)
I notified the patient and scheduled her an appointment for 1/16 at 2:00pm.

## 2022-09-21 NOTE — Telephone Encounter (Signed)
Dr. Patsey Berthold decided to not do peer to peer at this time to just CXL CT appt until the patient can be off her blood thinner for bronch

## 2022-09-24 ENCOUNTER — Ambulatory Visit: Payer: 59

## 2022-10-06 NOTE — Progress Notes (Deleted)
Referring Physician:  Maryland Pink, MD [address] [place],  [place] [address]  Primary Physician:  Maryland Pink, MD  History of Present Illness: Teresa Sparks has a history of celiac artery stenosis, HTN, GERD, and hyperlipidemia.   She has known right foot drop (x 29 years after previous surgery).   Last has phone visit with me on 08/23/22. She was doing very well after PT with only minimal intermittent left leg pain with standing and no LBP.   She has known moderate/severe central stenosis L2-L3 with moderate central stenosis L3-L4 along with left sided foraminal disc.   She was to continue with PT and is here for follow up.        She has  constant left sided LBP with radiation to her left lateral thigh (not past the knee). The entire left leg goes numb with prolonged standing/walking. No right sided pain. History of right TKA. She has some improvement with neurontin at night. She cannot walk 100 feet without stopping. Some improvement with grocery cart. She notes some weakness in left leg.   In last year, the ESIs have only helped her for a week or so. No recent PT.   She is on PLAVIX. She does not smoke.   Conservative measures:  Physical therapy: currently in PT and has done *** visits per patient Multimodal medical therapy including regular antiinflammatories: neurontin, tylenol, flexeril, ultram.  Injections:  06/15/2022: Left L5-S1 transforaminal ESI 03/12/2022: Left L5-S1 transforaminal ESI (moderate relief, less effective) 12/08/2021: Left L5-S1 transforaminal ESI (good relief) 09/08/2021: Left L5-S1 transforaminal ESI (good relief) 05/21/2021: Bilateral L5-S1 transforaminal ESI (good relief) 04/23/2021: Left L5-S1 transforaminal ESI (80% relief) 09/06/2019: Left L5-S1 transforaminal ESI (55% relief) 01/25/2019: Right L5-S1 transforaminal ESI (good relief) 03/20/2018: Left L5-S1 transforaminal ESI (good relief) 10/04/2017: Left  L5-S1 transforaminal ESI (moderate to good relief) 08/31/2017: Right L5-S1 transforaminal ESI (good relief)   Past Surgery: no spinal surgery  Teresa Sparks has no symptoms of cervical myelopathy.  The symptoms are causing a significant impact on the patient's life.   Review of Systems:  A 10 point review of systems is negative, except for the pertinent positives and negatives detailed in the HPI.  Past Medical History: Past Medical History:  Diagnosis Date   Adenomatous colon polyp    Adrenal adenoma    Allergic genetic state    Arthritis    Chronic abdominal pain    GERD (gastroesophageal reflux disease)    Hiatal hernia    Hypertension     Past Surgical History: Past Surgical History:  Procedure Laterality Date   ABDOMINAL HYSTERECTOMY     CHOLECYSTECTOMY     COLONOSCOPY     COLONOSCOPY WITH PROPOFOL N/A 07/24/2018   Procedure: COLONOSCOPY WITH PROPOFOL;  Surgeon: Manya Silvas, MD;  Location: [facility] ENDOSCOPY;  Service: Endoscopy;  Laterality: N/A;   FRACTURE SURGERY     JOINT REPLACEMENT     6 years ago   VISCERAL ANGIOGRAPHY N/A 02/05/2022   Procedure: VISCERAL ANGIOGRAPHY;  Surgeon: Katha Cabal, MD;  Location: [facility];  Service: Cardiovascular;  Laterality: N/A;    Allergies: Allergies as of 10/12/2022 - Review Complete 09/13/2022  Allergen Reaction Noted   Iodinated contrast media Anaphylaxis 05/29/2014   Ciprofloxacin Diarrhea and Nausea Only 07/04/2014   Codeine sulfate [codeine] Diarrhea 07/21/2018   Meloxicam  07/21/2018   Penicillins Nausea Only 07/21/2018   Protonix [pantoprazole sodium] Hives 07/21/2018   Shellfish allergy  Swelling 07/21/2018   Sulfa antibiotics Rash 08/08/2016    Medications: Outpatient Encounter Medications as of 10/12/2022  Medication Sig   acetaminophen (TYLENOL) 650 MG CR tablet Take 650 mg by mouth every 8 (eight) hours as needed for pain.   aspirin 81 MG chewable tablet Chew by mouth daily.    atorvastatin (LIPITOR) 10 MG tablet Take 1 tablet (10 mg total) by mouth daily at 6 PM.   cetirizine (ZYRTEC) 10 MG tablet Take 10 mg by mouth daily.   clopidogrel (PLAVIX) 75 MG tablet Take 1 tablet (75 mg total) by mouth daily with breakfast.   colchicine 0.6 MG tablet Take by mouth.   DULoxetine (CYMBALTA) 30 MG capsule Take 30 mg by mouth daily.   esomeprazole (NEXIUM) 40 MG capsule Take 1 capsule by mouth daily.   famotidine (PEPCID) 20 MG tablet Take 1 tablet (20 mg total) by mouth daily for 10 days.   fluticasone (FLONASE) 50 MCG/ACT nasal spray Place into both nostrils daily.   gabapentin (NEURONTIN) 300 MG capsule Take 300 mg by mouth 2 (two) times daily.   hydrALAZINE (APRESOLINE) 50 MG tablet Take 50 mg by mouth 2 (two) times daily.   lisinopril-hydrochlorothiazide (ZESTORETIC) 10-12.5 MG tablet Take 1 tablet by mouth daily.   Metoprolol Succinate 25 MG CS24 Take by mouth daily.   Multiple Vitamins-Minerals (CENTRUM ADULTS PO) Take by mouth daily.   omeprazole (PRILOSEC) 40 MG capsule Take 40 mg by mouth 2 (two) times daily.   PARoxetine (PAXIL) 10 MG tablet Take 1 tablet by mouth daily at 6 (six) AM.   traMADol (ULTRAM) 50 MG tablet Take 1 tablet (50 mg total) by mouth every 6 (six) hours as needed for severe pain. (Patient not taking: Reported on 09/06/2022)   No facility-administered encounter medications on file as of 10/12/2022.    Social History: Social History   Tobacco Use   Smoking status: Former    Packs/day: 0.25    Years: 2.00    Total pack years: 0.50    Types: Cigarettes    Quit date: 1973    Years since quitting: 51.0   Smokeless tobacco: Never  Vaping Use   Vaping Use: Never used  Substance Use Topics   Alcohol use: No   Drug use: Never    Family Medical History: Family History  Problem Relation Age of Onset   Hypertension Mother    Stroke Mother    Epilepsy Mother    Colon polyps Sister    Breast cancer Other     Physical Examination: There  were no vitals filed for this visit.   General: Patient is well developed, well nourished, calm, collected, and in no apparent distress. Attention to examination is appropriate.  Respiratory: Patient is breathing without any difficulty.   NEUROLOGICAL:     Awake, alert, oriented to person, place, and time.  Speech is clear and fluent. Fund of knowledge is appropriate.   Cranial Nerves: Pupils equal round and reactive to light.  Facial tone is symmetric.  Facial sensation is symmetric.  ROM of spine:  Limited ROM of lumbar spine with pain on extension  No abnormal lesions on exposed skin.   Strength: Side Biceps Triceps Deltoid Interossei Grip Wrist Ext. Wrist Flex.  R 5 5 5 5 5 5 5  $ L 5 5 5 5 5 5 5   $ Side Iliopsoas Quads Hamstring PF DF EHL  R 5 5 5 5 3 3  $ L 5 5 5 5 5 $ 5  She has known chronic right foot drop.   Reflexes are 2+ and symmetric at the biceps, triceps, brachioradialis, patella and achilles.   Hoffman's is absent.  Clonus is not present.   Bilateral upper and lower extremity sensation is intact to light touch.     She walks with limp favoring left leg.   Medical Decision Making  Imaging: No recent lumbar imaging.   Assessment and Plan: Teresa Sparks is a pleasant 72 y.o. female with great improvement in symptoms with PT. She has only intermittent minimal left leg pain with prolonged standing. No LBP.    Chronic right foot drop x years.    She has known moderate/severe central stenosis L2-L3 with moderate central stenosis L3-L4 along with left sided foraminal disc.    Treatment options discussed with patient and following plan made:    - Continue with PT as she is doing so well.  - Continue on neurontin from other providers.  - Of note, she is on PLAVIX.  - Hold on surgery options as she is improved. Can revisit if symptoms get worse.  - Follow up with me in 6-8 weeks. Call with concerns.   Above lumbar MRI reviewed with Dr. Izora Ribas regarding  "buckling of nerve roots superior to L2-L3." No concerning characteristics seen and this may be due to surrounding stenosis. No change to above plan.    I spent a total of *** minutes in face-to-face and non-face-to-face activities related to this patient's care toda including review of outside records, review of imaging, review of symptoms, physical exam, discussion of differential diagnosis, discussion of treatment options, and documentation.   Geronimo Boot PA-C Dept. of Neurosurgery

## 2022-10-12 ENCOUNTER — Ambulatory Visit: Payer: 59 | Admitting: Orthopedic Surgery

## 2022-10-12 ENCOUNTER — Ambulatory Visit (INDEPENDENT_AMBULATORY_CARE_PROVIDER_SITE_OTHER): Payer: 59 | Admitting: Pulmonary Disease

## 2022-10-12 ENCOUNTER — Encounter: Payer: Self-pay | Admitting: Pulmonary Disease

## 2022-10-12 VITALS — BP 134/80 | HR 64 | Temp 98.3°F | Ht 68.0 in | Wt 233.2 lb

## 2022-10-12 DIAGNOSIS — I771 Stricture of artery: Secondary | ICD-10-CM

## 2022-10-12 DIAGNOSIS — E669 Obesity, unspecified: Secondary | ICD-10-CM

## 2022-10-12 DIAGNOSIS — R918 Other nonspecific abnormal finding of lung field: Secondary | ICD-10-CM | POA: Diagnosis not present

## 2022-10-12 NOTE — Progress Notes (Signed)
Subjective:    Patient ID: Teresa Sparks, female    DOB: 09-30-1950, 72 y.o.   MRN: [id number] Patient Care Team: Maryland Pink, MD as PCP - General [facility] Medicine)  Chief Complaint  Patient presents with   Follow-up    SOB with exertion. No wheezing or cough.     HPI The patient is a 72 year old remote former smoker with minimal tobacco exposure in the past and with history as noted below who presents for follow-up of 2 nodular densities noted on CT scan of the chest.  She was initially evaluated on 09 March 2022, and last seen in follow-up on 09 June 2022.  This is a scheduled visit.  The lesions in question were initially noted during imaging performed on 04 Feb 2022 when she presented with severe abdominal pain to [facility]. She had presented with sudden onset of severe pulsating epigastric pain that radiated into the chest and the back. She had some associated shortness of breath with this as well as nausea. She was noted to be in hypertensive urgency at the time with blood pressure of 222/90. Heart rate was 102. 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 A celiac artery stent was placed on 05 Feb 2022 and patient was loaded with Plavix and started on Plavix 75 mg daily. She is currently on aspirin and Plavix. He was also started on Lipitor.  The patient underwent follow-up CT and PET/CT.  The lesions in question were noted to have no FDG uptake.  However they are still suspicious for low-grade adenocarcinomas and the patient should undergo biopsy of these lesions.  The preferred procedure would be robotic assisted navigational bronchoscopy given the location of the lesions.  The delay in biopsy occurred due to the patient needing to be on Plavix through December.  She discontinued Plavix in December however started having abdominal pain again and she resumed it.    The patient continues to be asymptomatic with regards to her chest  findings however does remain somewhat anxious about the possibility that these could be cancer.  The patient underwent [facility] protocol CT in August with the thought that she would need biopsy at that time however given that she needs a longer time of uninterrupted antiplatelet therapy she will need to be continued on active surveillance.  Another CT chest was ordered to be performed prior to this appointment however insurance declined it.  I did discuss with the patient other possibilities to include performing a Nodify Lung assay and the patient agrees to go ahead with this.  We will perform this today.  She has not had any fevers, chills or sweats.  No cough or sputum production.  No chest pain.  No orthopnea or paroxysmal nocturnal dyspnea.  No lower extremity edema.  She continues to have some issues with intermittent abdominal pain however continues to follow closely with vascular surgery.  They do not feel this is related to her celiac artery stenosis post stent.   Review of Systems A 10 point review of systems was performed and it is as noted above otherwise negative.  Patient Active Problem List   Diagnosis Date Noted   Essential hypertension 03/11/2022   Hyperlipidemia 03/11/2022   GERD (gastroesophageal reflux disease) 03/11/2022   Celiac artery stenosis ([place]) 02/04/2022   Social History   Tobacco Use   Smoking status: Former    Packs/day: 0.25    Years: 2.00    Total pack  years: 0.50    Types: Cigarettes    Quit date: 51    Years since quitting: 51.0   Smokeless tobacco: Never  Substance Use Topics   Alcohol use: No   Allergies  Allergen Reactions   Iodinated Contrast Media Anaphylaxis    Other reaction(s): Vomiting   Ciprofloxacin Diarrhea and Nausea Only    Nausea, vomiting  Other reaction(s): Vomiting   Codeine Sulfate [Codeine] Diarrhea    Nausea, vomiting   Meloxicam    Penicillins Nausea Only   Protonix [Pantoprazole Sodium] Hives   Shellfish Allergy  Swelling   Sulfa Antibiotics Rash   Current Meds  Medication Sig   acetaminophen (TYLENOL) 650 MG CR tablet Take 650 mg by mouth every 8 (eight) hours as needed for pain.   aspirin 81 MG chewable tablet Chew by mouth daily.   cetirizine (ZYRTEC) 10 MG tablet Take 10 mg by mouth daily.   clopidogrel (PLAVIX) 75 MG tablet Take 1 tablet (75 mg total) by mouth daily with breakfast.   colchicine 0.6 MG tablet Take by mouth.   DULoxetine (CYMBALTA) 30 MG capsule Take 30 mg by mouth daily.   esomeprazole (NEXIUM) 40 MG capsule Take 1 capsule by mouth daily.   fluticasone (FLONASE) 50 MCG/ACT nasal spray Place into both nostrils daily.   gabapentin (NEURONTIN) 300 MG capsule Take 300 mg by mouth 2 (two) times daily.   hydrALAZINE (APRESOLINE) 50 MG tablet Take 50 mg by mouth 2 (two) times daily.   lisinopril-hydrochlorothiazide (ZESTORETIC) 10-12.5 MG tablet Take 1 tablet by mouth daily.   metoprolol succinate (TOPROL-XL) 25 MG 24 hr tablet Take 25 mg by mouth daily.   Multiple Vitamins-Minerals (CENTRUM ADULTS PO) Take by mouth daily.   omeprazole (PRILOSEC) 40 MG capsule Take 40 mg by mouth 2 (two) times daily.   PARoxetine (PAXIL) 10 MG tablet Take 1 tablet by mouth daily at 6 (six) AM.   Immunization History  Administered Date(s) Administered   Fluad Quad(high Dose 65+) 07/24/2021   Influenza-Unspecified 07/06/2019, 07/28/2022   Moderna Sars-Covid-2 Vaccination 11/03/2019, 11/30/2019, 08/19/2020       Objective:   Physical Exam BP 134/80 (BP Location: Left Arm, Cuff Size: Large)   Pulse 64   Temp 98.3 F (36.8 C)   Ht '5\' 8"'$  (1.727 m)   Wt 233 lb 3.2 oz (105.8 kg)   SpO2 99%   BMI 35.46 kg/m   SpO2: 99 % O2 Device: None (Room air)  GENERAL: Obese woman, no acute distress, fully ambulatory, no conversational dyspnea. HEAD: Normocephalic, atraumatic.  EYES: Pupils equal, round, reactive to light.  No scleral icterus.  MOUTH: Nose/mouth/throat not examined due to institutional  masking requirements. NECK: Supple. No thyromegaly. Trachea midline. No JVD.  No adenopathy. PULMONARY: Good air entry bilaterally.  No adventitious sounds. CARDIOVASCULAR: S1 and S2. Regular rate and rhythm.  No rubs, murmurs or gallops heard. ABDOMEN: Obese, otherwise benign. MUSCULOSKELETAL: No joint deformity, no clubbing, no edema.  NEUROLOGIC: No overt focal deficit, no gait disturbance, speech is fluent. SKIN: Intact,warm,dry. PSYCH: Mood and behavior normal.  SPN risk for either lesion 3.4% ([facility])     Assessment & Plan:     ICD-10-CM   1. Multiple lung nodules on CT  R91.8    SPN risk for either lesion is 3.4% Prior negative PET/CT Proceed with Nodify Lung assay Insurance denied follow-up CT chest    2. Celiac artery stenosis ([facility])  I77.1    Currently on Plavix and aspirin Follows closely with  vascular surgery    3. Obesity, Class II, BMI 35-39.9  E66.9    This issue adds complexity to her management     As noted, insurance has denied follow-up CT chest.  We will proceed with performing a Nodify Lung ([employer]) assay.  This will further help with clarifying risk assessment.  We will see the patient in follow-up in 3 months time she is to contact us prior to that time should any new difficulties arise.  Renold Don, MD Advanced Bronchoscopy PCCM [facility]    *This note was dictated using voice recognition software/Dragon.  Despite best efforts to proofread, errors can occur which can change the meaning. Any transcriptional errors that result from this process are unintentional and may not be fully corrected at the time of dictation.

## 2022-10-12 NOTE — Patient Instructions (Signed)
We will let you know the results of your blood test as soon as these are available.  We will see you in follow-up in 3 months time call sooner should any new problems arise.

## 2022-10-20 ENCOUNTER — Other Ambulatory Visit: Payer: Self-pay | Admitting: Family Medicine

## 2022-10-20 DIAGNOSIS — Z1231 Encounter for screening mammogram for malignant neoplasm of breast: Secondary | ICD-10-CM

## 2022-10-25 ENCOUNTER — Telehealth: Payer: Self-pay

## 2022-10-25 NOTE — Telephone Encounter (Signed)
I spoke with Meghan from [employer] ([facility]) about the patient's blood sample that was sent in on 10/12/2022. She said the blood sample did not make it to the lab on time, due to a bad weather in the area, and they had to cancel the order. She said the company can send out a phlebotomist to the patient's home to draw her blood again (at no cost), if Dr. Patsey Berthold agrees to it.   Per Dr. Patsey Berthold, ok to redraw the patient's blood.  I left a message for the patient to return my call to notify her and make sure she is ok with someone coming out to her home.

## 2022-10-25 NOTE — Telephone Encounter (Addendum)
I have notified the patient. She is ok with having her blood redrawn at home.  I have notified Meghan with [employer]. She will call and get the patient scheduled for a blood draw.  Nothing further needed.

## 2022-10-26 ENCOUNTER — Telehealth: Payer: Self-pay | Admitting: Pulmonary Disease

## 2022-10-26 NOTE — Telephone Encounter (Signed)
Please see 10/25/2022 phone note. She is aware that Teresa Sparks has spoken with her since leaving a voicemail. She had no further questions or concerns.  Nothing further needed.

## 2022-10-26 NOTE — Telephone Encounter (Signed)
PT ret Kristy's call. May be an old message. See last signed encounter. Or may be calling to set up appt for home blood draw. Pls call to advise PT. TY/

## 2022-11-11 ENCOUNTER — Telehealth: Payer: Self-pay

## 2022-11-11 DIAGNOSIS — R918 Other nonspecific abnormal finding of lung field: Secondary | ICD-10-CM

## 2022-11-11 NOTE — Telephone Encounter (Signed)
Notify results reviewed by Dr. Patsey Berthold- the testing overall did not help in this case. She has a f/u with me in 3 months. Would recommend non-contrasted CT for f/u which can be obtained at that time.   She will need an appt in April. I have placed the order for the CT. I have left a message for the patient to return my call.

## 2022-11-12 NOTE — Telephone Encounter (Signed)
I have left another message for the patient to return my call.

## 2022-11-12 NOTE — Telephone Encounter (Signed)
I have notified the patient and scheduled her an appt for 4/16 at 8:30am.  Nothing further needed.

## 2022-11-29 ENCOUNTER — Ambulatory Visit
Admission: RE | Admit: 2022-11-29 | Discharge: 2022-11-29 | Disposition: A | Discharge status: Home / Self Care | Payer: 59 | Source: Ambulatory Visit | Attending: Family Medicine | Admitting: Family Medicine

## 2022-11-29 DIAGNOSIS — Z1231 Encounter for screening mammogram for malignant neoplasm of breast: Secondary | ICD-10-CM | POA: Insufficient documentation

## 2022-11-30 ENCOUNTER — Other Ambulatory Visit (INDEPENDENT_AMBULATORY_CARE_PROVIDER_SITE_OTHER): Payer: Self-pay | Admitting: Nurse Practitioner

## 2022-11-30 MED ORDER — CLOPIDOGREL BISULFATE 75 MG PO TABS: 75.0000 mg | ORAL_TABLET | Freq: Every day | ORAL | 3 refills | Status: DC

## 2023-01-04 ENCOUNTER — Ambulatory Visit
Admission: RE | Admit: 2023-01-04 | Discharge: 2023-01-04 | Disposition: A | Discharge status: Home / Self Care | Payer: 59 | Source: Ambulatory Visit | Attending: Pulmonary Disease | Admitting: Pulmonary Disease

## 2023-01-04 DIAGNOSIS — R918 Other nonspecific abnormal finding of lung field: Secondary | ICD-10-CM | POA: Diagnosis present

## 2023-01-11 ENCOUNTER — Ambulatory Visit (INDEPENDENT_AMBULATORY_CARE_PROVIDER_SITE_OTHER): Payer: 59 | Admitting: Pulmonary Disease

## 2023-01-11 ENCOUNTER — Encounter: Payer: Self-pay | Admitting: Pulmonary Disease

## 2023-01-11 VITALS — BP 146/78 | HR 66 | Temp 98.3°F | Ht 68.0 in | Wt 232.8 lb

## 2023-01-11 DIAGNOSIS — R918 Other nonspecific abnormal finding of lung field: Secondary | ICD-10-CM

## 2023-01-11 DIAGNOSIS — E669 Obesity, unspecified: Secondary | ICD-10-CM | POA: Diagnosis not present

## 2023-01-11 DIAGNOSIS — I771 Stricture of artery: Secondary | ICD-10-CM

## 2023-01-11 NOTE — Progress Notes (Signed)
Subjective:    Patient ID: Teresa Sparks, female    DOB: 04/06/1951, 72 y.o.   MRN: [id number] Patient Care Team: Jerl Mina, MD as PCP - General (Family Medicine) Salena Saner, MD as Consulting Physician (Pulmonary Disease)  Chief Complaint  Patient presents with   Follow-up    No SOB, wheezing or cough. Covid a month ago.    HPI The patient is a 72 year old remote former smoker with minimal tobacco exposure in the past and with history as noted below who presents for follow-up of 2 nodular densities noted on CT scan of the chest.  She was initially evaluated on 09 March 2022, and last seen in follow-up on 12 October 2022.  This is a scheduled visit.    Recall that the lesions in question were initially noted during imaging performed on 04 Feb 2022 when she presented with severe abdominal pain to [facility]. She had presented with sudden onset of severe pulsating epigastric pain that radiated into the chest and the back. She had some associated shortness of breath with this as well as nausea. She was noted to be in hypertensive urgency at the time with blood pressure of 222/90. Heart rate was 102. 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 A celiac artery stent was placed on 05 Feb 2022 and patient was loaded with Plavix and started on Plavix 75 mg daily. She is currently on aspirin and Plavix. He was also started on Lipitor.  The patient underwent follow-up CT and PET/CT.  The lesions in question were noted to have no FDG uptake.  However they are still suspicious for low-grade adenocarcinomas and the patient should undergo biopsy of these lesions.  The preferred procedure would be robotic assisted navigational bronchoscopy given the location of the lesions.  The patient was reluctant to undergo biopsy and it was also noted that she needed to be on Plavix through December without interruption.  She discontinued Plavix in December however  started having abdominal pain again and she resumed it.     The patient continues to be asymptomatic with regards to her chest findings however does remain somewhat anxious about the possibility that these could be cancer.  At the same time she is not too enthusiastic about biopsies.  The patient underwent Monarch protocol CT in August with the thought that she would need biopsy at that time however given that she needs a longer time of uninterrupted antiplatelet therapy she will need to be continued on active surveillance.  The patient underwent Nodify Lung (Biodesix) and this was indeterminate.  She had follow-up imaging with a CT chest on 04 January 2023 that shows no change on these lesions.  I reviewed the films independently and reviewed them with the patient.  She has not had any fevers, chills or sweats.  No cough or sputum production.  No chest pain.  No orthopnea or paroxysmal nocturnal dyspnea.  No lower extremity edema.  She has discontinued Plavix since her last visit.  She has not had any further abdominal pain.    We discussed the next steps with regards to these lesions.  We discussed biopsy versus continued active surveillance.  Discussed the pros and cons of each.  The patient still would like to remain on active surveillance.  DATA 02/04/2022 CT angio chest: Bilateral ill-defined nodular opacities noted bilaterally largest measuring 14 mm.  Recommend 21-month follow-up. 05/07/2022 CT chest Monarch protocol: Unchanged mixed solid and  cystic nodules in the left upper lobe and right upper lobe.  Nodules suspicious for adenocarcinoma. 05/26/2022 PET/CT: Hypermetabolism associated with the part solid nodules in the left upper lobe and right upper lobe.  No mediastinal adenopathy. 11/05/2022 Nodify Lung (Biodesix): Indeterminate risk. 01/04/2023 CT chest without contrast: Left upper lobe mixed attenuation nodule 1.4 x 1.3 cm solid central component of 8 x 9 mm, similar size and appearance from  prior.  Right upper lobe solid nodule 1.1 x 1.0 cm essentially unchanged from prior.  Review of Systems A 10 point review of systems was performed and it is as noted above otherwise negative.  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/04/2022   Social History   Tobacco Use   Smoking status: Former    Packs/day: 0.25    Years: 2.00    Additional pack years: 0.00    Total pack years: 0.50    Types: Cigarettes    Quit date: 75    Years since quitting: 51.3   Smokeless tobacco: Never  Substance Use Topics   Alcohol use: No   Allergies  Allergen Reactions   Iodinated Contrast Media Anaphylaxis    Other reaction(s): Vomiting   Ciprofloxacin Diarrhea and Nausea Only    Nausea, vomiting  Other reaction(s): Vomiting   Codeine Sulfate [Codeine] Diarrhea    Nausea, vomiting   Meloxicam    Penicillins Nausea Only   Protonix [Pantoprazole Sodium] Hives   Shellfish Allergy Swelling   Sulfa Antibiotics Rash   Current Meds  Medication Sig   acetaminophen (TYLENOL) 650 MG CR tablet Take 650 mg by mouth every 8 (eight) hours as needed for pain.   aspirin 81 MG chewable tablet Chew by mouth daily.   cetirizine (ZYRTEC) 10 MG tablet Take 10 mg by mouth daily.   colchicine 0.6 MG tablet Take by mouth.   DULoxetine (CYMBALTA) 30 MG capsule Take 30 mg by mouth daily.   esomeprazole (NEXIUM) 40 MG capsule Take 1 capsule by mouth daily.   fluticasone (FLONASE) 50 MCG/ACT nasal spray Place into both nostrils daily.   gabapentin (NEURONTIN) 300 MG capsule Take 300 mg by mouth 2 (two) times daily.   hydrALAZINE (APRESOLINE) 50 MG tablet Take 50 mg by mouth 2 (two) times daily.   lisinopril-hydrochlorothiazide (ZESTORETIC) 10-12.5 MG tablet Take 1 tablet by mouth daily.   metoprolol succinate (TOPROL-XL) 25 MG 24 hr tablet Take 25 mg by  mouth daily.   Multiple Vitamins-Minerals (CENTRUM ADULTS PO) Take by mouth daily.   omeprazole (PRILOSEC) 40 MG capsule Take 40 mg by mouth 2 (two) times daily.   PARoxetine (PAXIL) 10 MG tablet Take 1 tablet by mouth daily at 6 (six) AM.   traMADol (ULTRAM) 50 MG tablet Take 1 tablet (50 mg total) by mouth every 6 (six) hours as needed for severe pain.   Immunization History  Administered Date(s) Administered   Fluad Quad(high Dose 65+) 07/24/2021   Influenza-Unspecified 07/06/2019, 07/28/2022   Moderna Sars-Covid-2 Vaccination 11/03/2019, 11/30/2019, 08/19/2020       Objective:   Physical Exam BP (!) 146/78 (BP Location: Left Arm, Cuff Size: Large)   Pulse 66   Temp 98.3 F (36.8 C)   Ht  (1.727 m)   Wt 232 lb 12.8 oz (105.6 kg)   SpO2 99%   BMI 35.40 kg/m    SpO2: 99 %  O2 Device: None (Room air)  GENERAL: Obese woman, no acute distress, fully ambulatory, no conversational dyspnea. HEAD: Normocephalic, atraumatic.  EYES: Pupils equal, round, reactive to light.  No scleral icterus.  MOUTH: Nose/mouth/throat not examined due to institutional masking requirements. NECK: Supple. No thyromegaly. Trachea midline. No JVD.  No adenopathy. PULMONARY: Good air entry bilaterally.  No adventitious sounds. CARDIOVASCULAR: S1 and S2. Regular rate and rhythm.  No rubs, murmurs or gallops heard. ABDOMEN: Obese, otherwise benign. MUSCULOSKELETAL: No joint deformity, no clubbing, no edema.  NEUROLOGIC: No overt focal deficit, no gait disturbance, speech is fluent. SKIN: Intact,warm,dry. PSYCH: Mood and behavior normal.  Representative images from 04 January 2023 CT chest showing the left upper lobe and right upper lobe nodules (arrows):         Assessment & Plan:     ICD-10-CM   1. Multiple lung nodules on CT  R91.8 CT CHEST WO CONTRAST   Left upper lobe and right upper lobe nodules Unchanged from prior Prior negative PET/CT Patient declines biopsy Continue active  surveillance    2. Celiac artery stenosis  I77.1    Patient off Plavix No recurrent symptoms Status post stent placement by vascular    3. Obesity, Class II, BMI 35-39.9  E66.9    This issue adds complexity to her management She would benefit from weight loss     Orders Placed This Encounter  Procedures   CT CHEST WO CONTRAST    To be done in 6 months from 01/04/2023.    Standing Status:   Future    Standing Expiration Date:   01/11/2024    Order Specific Question:   Preferred imaging location?    Answer:   [facility]   We will see the patient in follow-up in 6 months time after CT chest is performed.  She is to call us in the interim if any new issues arise.  Gailen Shelter, MD Advanced Bronchoscopy PCCM [facility]    *This note was dictated using voice recognition software/Dragon.  Despite best efforts to proofread, errors can occur which can change the meaning. Any transcriptional errors that result from this process are unintentional and may not be fully corrected at the time of dictation.

## 2023-01-11 NOTE — Patient Instructions (Signed)
We discussed the options of proceeding with biopsy versus continued observation of these 2 spots in your lung.  You have elected for continued observation of these 2 spots in your lung.  We will do a follow-up scan in 6 months time I will see you in follow-up after that.  Please call us with any changes such as cough or bringing up sputum etc. that may concern you prior to that visit.

## 2023-07-22 ENCOUNTER — Ambulatory Visit
Admission: RE | Admit: 2023-07-22 | Discharge: 2023-07-22 | Disposition: A | Discharge status: Home / Self Care | Payer: 59 | Source: Ambulatory Visit | Attending: Pulmonary Disease | Admitting: Pulmonary Disease

## 2023-07-22 DIAGNOSIS — R918 Other nonspecific abnormal finding of lung field: Secondary | ICD-10-CM | POA: Insufficient documentation

## 2023-08-22 ENCOUNTER — Ambulatory Visit (INDEPENDENT_AMBULATORY_CARE_PROVIDER_SITE_OTHER): Payer: 59 | Admitting: Pulmonary Disease

## 2023-08-22 ENCOUNTER — Encounter: Payer: Self-pay | Admitting: Pulmonary Disease

## 2023-08-22 ENCOUNTER — Telehealth: Payer: Self-pay

## 2023-08-22 VITALS — BP 120/80 | HR 67 | Temp 97.1°F | Ht 68.0 in | Wt 240.2 lb

## 2023-08-22 DIAGNOSIS — R918 Other nonspecific abnormal finding of lung field: Secondary | ICD-10-CM | POA: Diagnosis not present

## 2023-08-22 DIAGNOSIS — I774 Celiac artery compression syndrome: Secondary | ICD-10-CM

## 2023-08-22 DIAGNOSIS — E66812 Obesity, class 2: Secondary | ICD-10-CM

## 2023-08-22 NOTE — Progress Notes (Unsigned)
Subjective:    Patient ID: Teresa Sparks, female    DOB: 09-04-1951, 72 y.o.   MRN: [id number]  Patient Care Team: Jerl Mina, MD as PCP - General (Family Medicine) Salena Saner, MD as Consulting Physician (Pulmonary Disease)  Chief Complaint  Patient presents with   Follow-up    DOE. No wheezing or cough.     BACKGROUND/INTERVAL:The patient is a 72 year old, remote former smoker,with minimal tobacco exposure in the past and with history as noted below who presents for follow-up of 2 nodular densities noted on CT scan of the chest.  She was initially evaluated on 09 March 2022, and last seen in follow-up on 11 January 2023.  This is a scheduled visit.   HPI Discussed the use of AI scribe software for clinical note transcription with the patient, who gave verbal consent to proceed.  History of Present Illness   The patient, with a history of lung nodules, reports no new symptoms or changes in her health status. She describes her breathing as "fine." Recent imaging shows a slight increase in the size of the lung nodules. The patient has received her flu and COVID vaccines.   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  We discussed the most recent findings on CT performed 22 July 2023.  She is now more open to the possibility of biopsy.  She however, would like to wait till "after the holidays" i.e. after the first of the year.  She continues to remain asymptomatic with regards to these nodules.  DATA 02/04/2022 CT angio chest: Bilateral ill-defined nodular opacities noted bilaterally largest measuring 14 mm.  Recommend 56-month follow-up. 05/07/2022 CT chest [facility] protocol: Unchanged mixed solid and cystic nodules in the left upper lobe and right upper lobe.  Nodules suspicious for adenocarcinoma. 05/26/2022 PET/CT: Hypermetabolism associated with the part solid  nodules in the left upper lobe and right upper lobe.  No mediastinal adenopathy. 11/05/2022 Nodify Lung (Biodesix): Indeterminate risk. 01/04/2023 CT chest without contrast: Left upper lobe mixed attenuation nodule 1.4 x 1.3 cm solid central component of 8 x 9 mm, similar size and appearance from prior.  Right upper lobe solid nodule 1.1 x 1.0 cm essentially unchanged from prior. 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  Left adrenal adenoma.  Review of Systems A 10 point review of systems was performed and it is as noted above otherwise negative.   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    Social History   Tobacco Use   Smoking status: Former    Current packs/day: 0.00    Average packs/day: 0.3 packs/day for 2.0 years (0.5 ttl pk-yrs)    Types: Cigarettes    Start date: 23    Quit date: 1973    Years since quitting: 51.9   Smokeless tobacco: Never  Substance Use Topics   Alcohol use: No    Allergies  Allergen Reactions   Iodinated Contrast Media Anaphylaxis    Other reaction(s): Vomiting   Ciprofloxacin Diarrhea and Nausea Only    Nausea,  vomiting  Other reaction(s): Vomiting   Codeine Sulfate [Codeine] Diarrhea    Nausea, vomiting   Meloxicam    Penicillins Nausea Only   Protonix [Pantoprazole Sodium] Hives   Shellfish Allergy Swelling   Sulfa Antibiotics Rash    Current Meds  Medication Sig   acetaminophen (TYLENOL) 650 MG CR tablet Take 650 mg by mouth every 8 (eight) hours as needed for pain.   aspirin 81 MG chewable  tablet Chew by mouth daily.   atorvastatin (LIPITOR) 10 MG tablet Take 1 tablet (10 mg total) by mouth daily at 6 PM.   cetirizine (ZYRTEC) 10 MG tablet Take 10 mg by mouth daily.   colchicine 0.6 MG tablet Take by mouth.   cyclobenzaprine (FLEXERIL) 5 MG tablet Take 5 mg by mouth as needed.   DULoxetine (CYMBALTA) 30 MG capsule Take 30 mg by mouth daily.   esomeprazole (NEXIUM) 40 MG capsule Take 1 capsule by mouth daily.   fluticasone (FLONASE) 50 MCG/ACT nasal spray Place into both nostrils daily.   gabapentin (NEURONTIN) 300 MG capsule Take 300 mg by mouth 2 (two) times daily.   hydrALAZINE (APRESOLINE) 50 MG tablet Take 50 mg by mouth 2 (two) times daily.   lisinopril-hydrochlorothiazide (ZESTORETIC) 10-12.5 MG tablet Take 1 tablet by mouth daily.   metoprolol succinate (TOPROL-XL) 25 MG 24 hr tablet Take 25 mg by mouth daily.   Multiple Vitamins-Minerals (CENTRUM ADULTS PO) Take by mouth daily.   omeprazole (PRILOSEC) 40 MG capsule Take 40 mg by mouth 2 (two) times daily.   PARoxetine (PAXIL) 10 MG tablet Take 1 tablet by mouth daily at 6 (six) AM.   traMADol (ULTRAM) 50 MG tablet Take 1 tablet (50 mg total) by mouth every 6 (six) hours as needed for severe pain.    Immunization History  Administered Date(s) Administered   Fluad Quad(high Dose 65+) 07/24/2021, 06/22/2023   Influenza-Unspecified 07/06/2019, 07/28/2022   Moderna Sars-Covid-2 Vaccination 11/03/2019, 11/30/2019, 08/19/2020   Pfizer(Comirnaty)Fall Seasonal Vaccine 12 years and older 06/22/2023        Objective:     BP 120/80 (BP Location: Right Wrist, Cuff Size: Normal)   Pulse 67   Temp (!) 97.1 F (36.2 C)   Ht 5\' 8"  (1.727 m)   Wt 240 lb 3.2 oz (109 kg)   SpO2 98%   BMI 36.52 kg/m   SpO2: 98 % O2 Device: None (Room air)  GENERAL: Obese woman, no acute distress, fully ambulatory, no conversational dyspnea. HEAD: Normocephalic, atraumatic.  EYES: Pupils equal, round, reactive to light.  No scleral  icterus.  MOUTH: Nose/mouth/throat not examined due to institutional masking requirements. NECK: Supple. No thyromegaly. Trachea midline. No JVD.  No adenopathy. PULMONARY: Good air entry bilaterally.  No adventitious sounds. CARDIOVASCULAR: S1 and S2. Regular rate and rhythm.  No rubs, murmurs or gallops heard. ABDOMEN: Obese, otherwise benign. MUSCULOSKELETAL: No joint deformity, no clubbing, no edema.  NEUROLOGIC: No overt focal deficit, no gait disturbance, speech is fluent. SKIN: Intact,warm,dry. PSYCH: Mood and behavior normal.:  Representative images from the CT performed 22 July 2023 showing the 2 nodules in question:      Assessment & Plan:     ICD-10-CM   1. Multiple lung nodules on CT  R91.8 CT SUPER D CHEST WO [facility] PILOT    2. Celiac artery stenosis (HCC)  I77.4     3. Obesity, Class II, BMI 35-39.9  U13.244       Orders Placed This Encounter  Procedures   CT  SUPER D CHEST WO [facility] PILOT    Patient scheduled for Bronchoscopy on 09/29/2022    Standing Status:   Future    Standing Expiration Date:   08/21/2024    Order Specific Question:   Preferred imaging location?    Answer:   [facility]   Discussion:    Pulmonary Nodules Pulmonary nodules on both lungs have shown slight growth since the last evaluation. Previous PET scan was negative, but further investigation is needed to determine if the nodules are benign or malignant. Discussed biopsy with robotic assistance and general anesthesia. Risks include potential pneumothorax, which may require an overnight hospital stay if a chest tube is needed. Reassured that biopsy will not cause cancer spread. - Schedule biopsy with robotic assistance and general anesthesia - Perform preoperative assessment and CT scan in late December - Schedule biopsy for early January - Ensure patient has a driver for the procedure - Patient no longer on Plavix  General Health Maintenance Up to date on flu and COVID-19  vaccinations. - Continue routine vaccinations and health maintenance  Follow-up - Schedule preoperative assessment and CT scan for or around September 26, 2023 - Schedule biopsy for September 30, 2023       C. Danice Goltz, MD Advanced Bronchoscopy PCCM [facility]    *This note was generated using voice recognition software/Dragon and/or AI transcription program.  Despite best efforts to proofread, errors can occur which can change the meaning. Any transcriptional errors that result from this process are unintentional and may not be fully corrected at the time of dictation.

## 2023-08-22 NOTE — Telephone Encounter (Signed)
Robotic Bronchoscopy with EBUS 09/29/2022 12:30pm Lung Nodule [id number], [id number], [id number]  Synetta Fail please see Bronch info.

## 2023-08-22 NOTE — Telephone Encounter (Signed)
Pre Admit- 09/23/2023 between 1p-5p.  Patient is aware.

## 2023-08-22 NOTE — Patient Instructions (Addendum)
VISIT SUMMARY:  During today's visit, we reviewed your recent imaging results, which showed a slight increase in the size of your lung nodules. We discussed the next steps, including scheduling a biopsy to further investigate the nature of these nodules. Additionally, we confirmed that you have received your flu and COVID-19 vaccinations.  YOUR PLAN:  -PULMONARY NODULES: Pulmonary nodules are small growths in the lungs. Your recent imaging shows that these nodules have grown slightly. To determine if they are benign or malignant, we will schedule a biopsy using robotic assistance and general anesthesia. This procedure carries some risks, such as a potential pneumothorax, which may require an overnight hospital stay if a chest tube is needed. Rest assured, the biopsy will not cause the cancer to spread.  -GENERAL HEALTH MAINTENANCE: You have received your flu and COVID-19 vaccinations. Please continue with routine vaccinations and general health maintenance as recommended.  INSTRUCTIONS:  Will schedule your preoperative assessment and CT scan for September 26, 2023, and your biopsy for September 30, 2023. Ensure you have a driver for the procedure.  We discussed that the procedure would have to be done under general anesthesia. The anesthesia team will discuss his part of the process with you.  Complications from the procedure itself are usually minor. One potential complication would be collapse of the lung which can occur in 2-3% of the cases. If  this happens we would have to put a small tube to relieve the collapse and you would have to spend the night in the hospital in that event.  Another possibility would be that of bleeding, this is usually taken care of during the procedure.  In the situations you may need to be observed overnight.  For the most part though, should be able to go home the same day.  Other possibilities could include that the procedure would be nondiagnostic meaning that no  definitive diagnosis could be attained.  We strive to try to decrease these potential issues.

## 2023-08-24 ENCOUNTER — Encounter: Payer: Self-pay | Admitting: Pulmonary Disease

## 2023-08-30 NOTE — Telephone Encounter (Signed)
I tried to do PA for the codes 08657, [phone], [phone]. Since the procedure is scheduled on 09/30/2023 I will have to try again during the week between Christmas and New Years. Insurance may change for the 1st of the year

## 2023-09-16 ENCOUNTER — Telehealth (INDEPENDENT_AMBULATORY_CARE_PROVIDER_SITE_OTHER): Payer: Self-pay

## 2023-09-16 NOTE — Telephone Encounter (Signed)
Teresa Sparks called from Dr. Robby Sermon office to verify a follow up appt for the patient. Patient has an appt 09/20/23 @ 11:00am   Teresa Sparks stated the patient has nausea, acid reflux and dizziness along with abdominal pain the same area the stent was placed.

## 2023-09-16 NOTE — Telephone Encounter (Signed)
She can come in with a mesenteric duplex to see me or GS, however if she can't wait til her follow up she can present to the ED for evaluation

## 2023-09-16 NOTE — Telephone Encounter (Signed)
Patient left a message stating that she has been in severe pain since Tuesday. The pain located at the top of the abdomen. The patient is feeling stabbing and cramping feeling. Patient  was last seen 08/2022 and had visceral angiography on 01/2022. Please Advise

## 2023-09-16 NOTE — Telephone Encounter (Signed)
Patient was notified with medical recommendations and verbalized understanding. Patient will move forward with scheduling appointment

## 2023-09-19 ENCOUNTER — Other Ambulatory Visit (INDEPENDENT_AMBULATORY_CARE_PROVIDER_SITE_OTHER): Payer: Self-pay | Admitting: Nurse Practitioner

## 2023-09-19 DIAGNOSIS — K551 Chronic vascular disorders of intestine: Secondary | ICD-10-CM

## 2023-09-19 DIAGNOSIS — R1084 Generalized abdominal pain: Secondary | ICD-10-CM

## 2023-09-19 NOTE — Telephone Encounter (Signed)
I'm having to wait as close to the new year a possible they wouldn't do the PA because it is scheduled in 2025

## 2023-09-20 ENCOUNTER — Ambulatory Visit (INDEPENDENT_AMBULATORY_CARE_PROVIDER_SITE_OTHER): Payer: 59

## 2023-09-20 DIAGNOSIS — K551 Chronic vascular disorders of intestine: Secondary | ICD-10-CM

## 2023-09-20 DIAGNOSIS — R1084 Generalized abdominal pain: Secondary | ICD-10-CM | POA: Diagnosis not present

## 2023-09-23 ENCOUNTER — Encounter
Admission: RE | Admit: 2023-09-23 | Discharge: 2023-09-23 | Disposition: A | Discharge status: Home / Self Care | Payer: 59 | Source: Ambulatory Visit | Attending: Pulmonary Disease | Admitting: Pulmonary Disease

## 2023-09-23 ENCOUNTER — Other Ambulatory Visit: Payer: Self-pay

## 2023-09-23 VITALS — Ht 69.0 in | Wt 228.0 lb

## 2023-09-23 DIAGNOSIS — I10 Essential (primary) hypertension: Secondary | ICD-10-CM

## 2023-09-23 HISTORY — DX: Sleep apnea, unspecified: G47.30

## 2023-09-23 NOTE — Patient Instructions (Addendum)
Your procedure is scheduled on: Friday 09/30/23  Report to the Registration Desk on the 1st floor of the [facility]. To find out your arrival time, please call [phone] between 1PM - 3PM on: Thursday 09/29/23  If your arrival time is 6:00 am, do not arrive before that time as the [facility] entrance doors do not open until 6:00 am.  REMEMBER: Instructions that are not followed completely may result in serious medical risk, up to and including death; or upon the discretion of your surgeon and anesthesiologist your surgery may need to be rescheduled.  Do not eat food or drink fluids after midnight the night before surgery.  No gum chewing or hard candies.   One week prior to surgery: Stop Anti-inflammatories (NSAIDS) such as Advil, Aleve, Ibuprofen, Motrin, Naproxen, Naprosyn and Aspirin based products such as Excedrin, Goody's Powder, BC Powder. Stop ANY OVER THE COUNTER supplements until after surgery.  You may however, continue to take Tylenol if needed for pain up until the day of surgery.   Continue taking all of your other prescription medications up until the day of surgery.  ON THE DAY OF SURGERY ONLY TAKE THESE MEDICATIONS WITH SIPS OF WATER:  DULoxetine (CYMBALTA) 30 MG  omeprazole (PRILOSEC) 40 MG  hydrALAZINE (APRESOLINE) 50 MG  metoprolol succinate (TOPROL-XL) 25 MG    No Alcohol for 24 hours before or after surgery.  No Smoking including e-cigarettes for 24 hours before surgery.  No chewable tobacco products for at least 6 hours before surgery.  No nicotine patches on the day of surgery.  Do not use any "recreational" drugs for at least a week (preferably 2 weeks) before your surgery.  Please be advised that the combination of cocaine and anesthesia may have negative outcomes, up to and including death. If you test positive for cocaine, your surgery will be cancelled.  On the morning of surgery brush your teeth with toothpaste and water, you may rinse your  mouth with mouthwash if you wish. Do not swallow any toothpaste or mouthwash.  Use CHG Soap or wipes as directed on instruction sheet.  Do not wear jewelry, make-up, hairpins, clips or nail polish.  For welded (permanent) jewelry: bracelets, anklets, waist bands, etc.  Please have this removed prior to surgery.  If it is not removed, there is a chance that hospital personnel will need to cut it off on the day of surgery.  Do not wear lotions, powders, or perfumes.   Do not shave body hair from the neck down 48 hours before surgery.  Contact lenses, hearing aids and dentures may not be worn into surgery.  Do not bring valuables to the hospital. [facility] is not responsible for any missing/lost belongings or valuables.   Total Shoulder Arthroplasty:  use Benzoyl Peroxide 5% Gel as directed on instruction sheet.  Bring your C-PAP to the hospital in case you may have to spend the night.   Notify your doctor if there is any change in your medical condition (cold, fever, infection).  Wear comfortable clothing (specific to your surgery type) to the hospital.  After surgery, you can help prevent lung complications by doing breathing exercises.  Take deep breaths and cough every 1-2 hours. Your doctor may order a device called an Incentive Spirometer to help you take deep breaths. When coughing or sneezing, hold a pillow firmly against your incision with both hands. This is called "splinting." Doing this helps protect your incision. It also decreases belly discomfort.  If you  are being admitted to the hospital overnight, leave your suitcase in the car. After surgery it may be brought to your room.  In case of increased patient census, it may be necessary for you, the patient, to continue your postoperative care in the Same Day Surgery department.  If you are being discharged the day of surgery, you will not be allowed to drive home. You will need a responsible individual to drive you home  and stay with you for 24 hours after surgery.   If you are taking public transportation, you will need to have a responsible individual with you.  Please call the Pre-admissions Testing Dept. at [phone] if you have any questions about these instructions.  Surgery Visitation Policy:  Patients having surgery or a procedure may have two visitors.  Children under the age of 104 must have an adult with them who is not the patient.  Inpatient Visitation:    Visiting hours are 7 a.m. to 8 p.m. Up to four visitors are allowed at one time in a patient room. The visitors may rotate out with other people during the day.  One visitor age 10 or older may stay with the patient overnight and must be in the room by 8 p.m.

## 2023-09-26 ENCOUNTER — Encounter
Admission: RE | Admit: 2023-09-26 | Discharge: 2023-09-26 | Disposition: A | Discharge status: Home / Self Care | Payer: 59 | Source: Ambulatory Visit | Attending: Pulmonary Disease | Admitting: Pulmonary Disease

## 2023-09-26 ENCOUNTER — Ambulatory Visit: Payer: 59 | Admitting: Pulmonary Disease

## 2023-09-26 ENCOUNTER — Encounter: Payer: Self-pay | Admitting: Pulmonary Disease

## 2023-09-26 VITALS — BP 130/70 | HR 57 | Temp 97.6°F | Ht 69.0 in | Wt 213.0 lb

## 2023-09-26 DIAGNOSIS — I10 Essential (primary) hypertension: Secondary | ICD-10-CM | POA: Insufficient documentation

## 2023-09-26 DIAGNOSIS — Z01818 Encounter for other preprocedural examination: Secondary | ICD-10-CM | POA: Insufficient documentation

## 2023-09-26 DIAGNOSIS — R918 Other nonspecific abnormal finding of lung field: Secondary | ICD-10-CM

## 2023-09-26 DIAGNOSIS — E66812 Obesity, class 2: Secondary | ICD-10-CM | POA: Diagnosis not present

## 2023-09-26 DIAGNOSIS — I774 Celiac artery compression syndrome: Secondary | ICD-10-CM | POA: Diagnosis not present

## 2023-09-26 LAB — CBC
HCT: 36 % (ref 36.0–46.0)
Hemoglobin: 11.5 g/dL — ABNORMAL LOW (ref 12.0–15.0)
MCH: 24.7 pg — ABNORMAL LOW (ref 26.0–34.0)
MCHC: 31.9 g/dL (ref 30.0–36.0)
MCV: 77.3 fL — ABNORMAL LOW (ref 80.0–100.0)
Platelets: 306 10*3/uL (ref 150–400)
RBC: 4.66 MIL/uL (ref 3.87–5.11)
RDW: 15.2 % (ref 11.5–15.5)
WBC: 8 10*3/uL (ref 4.0–10.5)
nRBC: 0 % (ref 0.0–0.2)

## 2023-09-26 LAB — BASIC METABOLIC PANEL
Anion gap: 11 (ref 5–15)
BUN: 16 mg/dL (ref 8–23)
CO2: 22 mmol/L (ref 22–32)
Calcium: 9.3 mg/dL (ref 8.9–10.3)
Chloride: 104 mmol/L (ref 98–111)
Creatinine, Ser: 0.94 mg/dL (ref 0.44–1.00)
GFR, Estimated: >60 mL/min (ref >60)
Glucose, Bld: 86 mg/dL (ref 70–99)
Potassium: 3.2 mmol/L — ABNORMAL LOW (ref 3.5–5.1)
Sodium: 137 mmol/L (ref 135–145)

## 2023-09-26 NOTE — Telephone Encounter (Signed)
Patient has [employer] for 2025. For the codes 16109, [id number], [phone] Prior Auth Not Required Refer # [id number]

## 2023-09-26 NOTE — Pre-Procedure Instructions (Signed)
Sent abnormal K+3.2 to Dr Jayme Cloud' inbasket since Quentin Mulling NP is out of the office this week

## 2023-09-26 NOTE — Patient Instructions (Signed)
VISIT SUMMARY:  During today's visit, we discussed the evaluation and upcoming biopsy of the nodules in your lungs. You mentioned feeling fine overall and shared some concerns about the procedure, which we addressed. We also clarified the use of antiseptic mouth rinse a for your upcoming biopsy.  YOUR PLAN:  -PULMONARY NODULES: Pulmonary nodules are small growths in the lungs that can be due to infection, inflammation, or potentially slow-growing cancer. We have scheduled a biopsy to determine the nature of these nodules.  You have been scheduled for a CT scan of the chest to be performed January 2 at 8:30 AM.  This is a scan that is done so that we can use the information for the procedure.  The biopsy itself will be performed on January3, 2025, at 12:30 PM under general anesthesia.  You will be instructed to present to the same day surgery area at no later than 11:30 AM for the preparation before the procedure.  We will monitor for any complications, such as a pneumothorax, which is a collapsed lung that may require hospitalization. You should resume taking 81 mg of aspirin after the procedure.  Please hold off on taking aspirin until after the procedure.  Please inform the anesthesiologist about your temporary teeth.  Prior to the procedure you will be provided with antiseptic mouth rinse this will be done in the preop area. We will schedule a follow-up appointment in 3-4 weeks to discuss the biopsy results and further management.  We will stay in contact with you prior to that via phone.  -GENERAL HEALTH MAINTENANCE: We discussed the use of antiseptic mouth rinse. Since there will be no skin incisions, CHG soap is not necessary.   INSTRUCTIONS:  Please come in for your CT scan of the chest on September 29, 2023, at 8:30 AM.  Your procedure has been scheduled for the following day September 30, 2023 at 12:30 PM.  Inform the anesthesiologist about your temporary teeth. Resume taking 81 mg of aspirin after  the procedure. We will schedule a follow-up appointment in 3-4 weeks to discuss the biopsy results and further management.

## 2023-09-26 NOTE — Telephone Encounter (Signed)
Noted. Nothing further needed. 

## 2023-09-29 ENCOUNTER — Ambulatory Visit
Admission: RE | Admit: 2023-09-29 | Discharge: 2023-09-29 | Disposition: A | Discharge status: Home / Self Care | Payer: Medicare PPO | Source: Ambulatory Visit | Attending: Pulmonary Disease | Admitting: Pulmonary Disease

## 2023-09-29 DIAGNOSIS — R918 Other nonspecific abnormal finding of lung field: Secondary | ICD-10-CM | POA: Insufficient documentation

## 2023-09-29 MED ORDER — ORAL CARE MOUTH RINSE: 15.0000 mL | Freq: Once | OROMUCOSAL | Status: AC

## 2023-09-29 MED ORDER — SODIUM CHLORIDE 0.9 % IV SOLN: Freq: Once | INTRAVENOUS | Status: DC

## 2023-09-29 MED ORDER — LACTATED RINGERS IV SOLN
INTRAVENOUS | Status: DC
Start: 2023-09-29 — End: 2023-09-30

## 2023-09-29 MED ORDER — CHLORHEXIDINE GLUCONATE 0.12 % MT SOLN
15.0000 mL | Freq: Once | OROMUCOSAL | Status: AC
  Administered 2023-09-30: 15 mL via OROMUCOSAL

## 2023-09-30 ENCOUNTER — Ambulatory Visit: Payer: Medicare Other

## 2023-09-30 ENCOUNTER — Encounter: Payer: Self-pay | Admitting: Pulmonary Disease

## 2023-09-30 ENCOUNTER — Ambulatory Visit: Payer: Medicare Other | Admitting: Anesthesiology

## 2023-09-30 ENCOUNTER — Other Ambulatory Visit: Payer: Self-pay

## 2023-09-30 ENCOUNTER — Telehealth (INDEPENDENT_AMBULATORY_CARE_PROVIDER_SITE_OTHER): Payer: Self-pay

## 2023-09-30 ENCOUNTER — Ambulatory Visit (HOSPITAL_BASED_OUTPATIENT_CLINIC_OR_DEPARTMENT_OTHER)
Admission: RE | Admit: 2023-09-30 | Discharge: 2023-09-30 | Disposition: A | Discharge status: Home / Self Care | Payer: Medicare Other | Source: Ambulatory Visit | Attending: Pulmonary Disease | Admitting: Pulmonary Disease

## 2023-09-30 ENCOUNTER — Encounter
Admission: RE | Disposition: A | Discharge status: Home / Self Care | Payer: Self-pay | Source: Ambulatory Visit | Attending: Pulmonary Disease

## 2023-09-30 DIAGNOSIS — E66811 Obesity, class 1: Secondary | ICD-10-CM | POA: Diagnosis not present

## 2023-09-30 DIAGNOSIS — E669 Obesity, unspecified: Secondary | ICD-10-CM | POA: Insufficient documentation

## 2023-09-30 DIAGNOSIS — G4730 Sleep apnea, unspecified: Secondary | ICD-10-CM | POA: Insufficient documentation

## 2023-09-30 DIAGNOSIS — I10 Essential (primary) hypertension: Secondary | ICD-10-CM | POA: Insufficient documentation

## 2023-09-30 DIAGNOSIS — E1151 Type 2 diabetes mellitus with diabetic peripheral angiopathy without gangrene: Secondary | ICD-10-CM | POA: Diagnosis not present

## 2023-09-30 DIAGNOSIS — Z01818 Encounter for other preprocedural examination: Secondary | ICD-10-CM

## 2023-09-30 DIAGNOSIS — R918 Other nonspecific abnormal finding of lung field: Secondary | ICD-10-CM

## 2023-09-30 DIAGNOSIS — Z6831 Body mass index (BMI) 31.0-31.9, adult: Secondary | ICD-10-CM | POA: Insufficient documentation

## 2023-09-30 DIAGNOSIS — Z820 Family history of epilepsy and other diseases of the nervous system: Secondary | ICD-10-CM | POA: Diagnosis not present

## 2023-09-30 DIAGNOSIS — Z79899 Other long term (current) drug therapy: Secondary | ICD-10-CM | POA: Diagnosis not present

## 2023-09-30 DIAGNOSIS — Z860101 Personal history of adenomatous and serrated colon polyps: Secondary | ICD-10-CM | POA: Diagnosis not present

## 2023-09-30 DIAGNOSIS — F32A Depression, unspecified: Secondary | ICD-10-CM | POA: Diagnosis not present

## 2023-09-30 DIAGNOSIS — Z823 Family history of stroke: Secondary | ICD-10-CM | POA: Diagnosis not present

## 2023-09-30 DIAGNOSIS — Z95828 Presence of other vascular implants and grafts: Secondary | ICD-10-CM | POA: Diagnosis not present

## 2023-09-30 DIAGNOSIS — Z7984 Long term (current) use of oral hypoglycemic drugs: Secondary | ICD-10-CM | POA: Diagnosis not present

## 2023-09-30 DIAGNOSIS — Z83719 Family history of colon polyps, unspecified: Secondary | ICD-10-CM | POA: Diagnosis not present

## 2023-09-30 DIAGNOSIS — Z803 Family history of malignant neoplasm of breast: Secondary | ICD-10-CM | POA: Diagnosis not present

## 2023-09-30 DIAGNOSIS — K5909 Other constipation: Secondary | ICD-10-CM | POA: Diagnosis not present

## 2023-09-30 DIAGNOSIS — Z7982 Long term (current) use of aspirin: Secondary | ICD-10-CM | POA: Diagnosis not present

## 2023-09-30 DIAGNOSIS — Z6833 Body mass index (BMI) 33.0-33.9, adult: Secondary | ICD-10-CM | POA: Diagnosis not present

## 2023-09-30 DIAGNOSIS — E785 Hyperlipidemia, unspecified: Secondary | ICD-10-CM | POA: Diagnosis not present

## 2023-09-30 DIAGNOSIS — Z87891 Personal history of nicotine dependence: Secondary | ICD-10-CM | POA: Insufficient documentation

## 2023-09-30 DIAGNOSIS — G8929 Other chronic pain: Secondary | ICD-10-CM | POA: Diagnosis not present

## 2023-09-30 DIAGNOSIS — K219 Gastro-esophageal reflux disease without esophagitis: Secondary | ICD-10-CM | POA: Insufficient documentation

## 2023-09-30 DIAGNOSIS — I771 Stricture of artery: Secondary | ICD-10-CM | POA: Diagnosis not present

## 2023-09-30 DIAGNOSIS — D3500 Benign neoplasm of unspecified adrenal gland: Secondary | ICD-10-CM | POA: Diagnosis not present

## 2023-09-30 DIAGNOSIS — K529 Noninfective gastroenteritis and colitis, unspecified: Secondary | ICD-10-CM | POA: Diagnosis not present

## 2023-09-30 DIAGNOSIS — R1013 Epigastric pain: Secondary | ICD-10-CM | POA: Diagnosis present

## 2023-09-30 DIAGNOSIS — Z8249 Family history of ischemic heart disease and other diseases of the circulatory system: Secondary | ICD-10-CM | POA: Diagnosis not present

## 2023-09-30 HISTORY — PX: ENDOBRONCHIAL ULTRASOUND: SHX5096

## 2023-09-30 SURGERY — BRONCHOSCOPY, WITH BIOPSY USING ELECTROMAGNETIC NAVIGATION
Anesthesia: General | Laterality: Bilateral

## 2023-09-30 MED ORDER — MIDAZOLAM HCL 2 MG/2ML IJ SOLN
INTRAMUSCULAR | Status: AC
Start: 2023-09-30 — End: ?
  Filled 2023-09-30: qty 2

## 2023-09-30 MED ORDER — PROPOFOL 500 MG/50ML IV EMUL
INTRAVENOUS | Status: DC | PRN
  Administered 2023-09-30: 100 ug/kg/min via INTRAVENOUS

## 2023-09-30 MED ORDER — SUGAMMADEX SODIUM 200 MG/2ML IV SOLN
INTRAVENOUS | Status: DC | PRN
  Administered 2023-09-30: 200 mg via INTRAVENOUS

## 2023-09-30 MED ORDER — ACETAMINOPHEN 325 MG PO TABS: 650.0000 mg | ORAL_TABLET | Freq: Once | ORAL | Status: DC | PRN

## 2023-09-30 MED ORDER — LIDOCAINE HCL (CARDIAC) PF 100 MG/5ML IV SOSY
PREFILLED_SYRINGE | INTRAVENOUS | Status: DC | PRN
  Administered 2023-09-30: 100 mg via INTRAVENOUS

## 2023-09-30 MED ORDER — ROCURONIUM BROMIDE 100 MG/10ML IV SOLN
INTRAVENOUS | Status: DC | PRN
  Administered 2023-09-30: 40 mg via INTRAVENOUS

## 2023-09-30 MED ORDER — ONDANSETRON HCL 4 MG/2ML IJ SOLN: 4.0000 mg | Freq: Once | INTRAMUSCULAR | Status: DC | PRN

## 2023-09-30 MED ORDER — ONDANSETRON HCL 4 MG/2ML IJ SOLN
INTRAMUSCULAR | Status: DC | PRN
  Administered 2023-09-30: 4 mg via INTRAVENOUS

## 2023-09-30 MED ORDER — PROPOFOL 10 MG/ML IV BOLUS
INTRAVENOUS | Status: DC | PRN
  Administered 2023-09-30: 130 mg via INTRAVENOUS

## 2023-09-30 MED ORDER — REMIFENTANIL HCL 1 MG IV SOLR
INTRAVENOUS | Status: DC | PRN
  Administered 2023-09-30: .1 ug/kg/min via INTRAVENOUS

## 2023-09-30 MED ORDER — IPRATROPIUM-ALBUTEROL 0.5-2.5 (3) MG/3ML IN SOLN
3.0000 mL | Freq: Once | RESPIRATORY_TRACT | Status: AC
  Administered 2023-09-30: 3 mL via RESPIRATORY_TRACT

## 2023-09-30 MED ORDER — ONDANSETRON HCL 4 MG/2ML IJ SOLN
INTRAMUSCULAR | Status: AC
  Filled 2023-09-30: qty 2

## 2023-09-30 MED ORDER — CHLORHEXIDINE GLUCONATE 0.12 % MT SOLN
OROMUCOSAL | Status: AC
  Filled 2023-09-30: qty 15

## 2023-09-30 MED ORDER — DEXAMETHASONE SODIUM PHOSPHATE 10 MG/ML IJ SOLN
INTRAMUSCULAR | Status: AC
  Filled 2023-09-30: qty 1

## 2023-09-30 MED ORDER — PROPOFOL 10 MG/ML IV BOLUS
INTRAVENOUS | Status: AC
  Filled 2023-09-30: qty 20

## 2023-09-30 MED ORDER — ACETAMINOPHEN 160 MG/5ML PO SOLN
325.0000 mg | ORAL | Status: DC | PRN
Start: 2023-09-30 — End: 2023-09-30
  Filled 2023-09-30: qty 20.3

## 2023-09-30 MED ORDER — PROPOFOL 1000 MG/100ML IV EMUL
INTRAVENOUS | Status: AC
  Filled 2023-09-30: qty 100

## 2023-09-30 MED ORDER — MIDAZOLAM HCL 2 MG/2ML IJ SOLN
INTRAMUSCULAR | Status: DC | PRN
  Administered 2023-09-30: 1 mg via INTRAVENOUS

## 2023-09-30 MED ORDER — PHENYLEPHRINE HCL-NACL 20-0.9 MG/250ML-% IV SOLN
INTRAVENOUS | Status: AC
  Filled 2023-09-30: qty 250

## 2023-09-30 MED ORDER — ACETAMINOPHEN 10 MG/ML IV SOLN
INTRAVENOUS | Status: DC | PRN
  Administered 2023-09-30: 1000 mg via INTRAVENOUS

## 2023-09-30 MED ORDER — REMIFENTANIL HCL 1 MG IV SOLR
INTRAVENOUS | Status: AC
  Filled 2023-09-30: qty 1000

## 2023-09-30 MED ORDER — GLYCOPYRROLATE 0.2 MG/ML IJ SOLN
INTRAMUSCULAR | Status: DC | PRN
  Administered 2023-09-30: .2 mg via INTRAVENOUS

## 2023-09-30 MED ORDER — IPRATROPIUM-ALBUTEROL 0.5-2.5 (3) MG/3ML IN SOLN
RESPIRATORY_TRACT | Status: AC
  Filled 2023-09-30: qty 3

## 2023-09-30 MED ORDER — DEXAMETHASONE SODIUM PHOSPHATE 10 MG/ML IJ SOLN
INTRAMUSCULAR | Status: DC | PRN
  Administered 2023-09-30: 8 mg via INTRAVENOUS

## 2023-09-30 NOTE — Interval H&P Note (Signed)
 Teresa Sparks has presented today for surgery, with the diagnosis of BILATERAL LUNG NODULES.  The various methods of treatment have been discussed with the patient and family. After consideration of risks, benefits and other options for treatment, the patient has consented to  Procedure(s): ROBOTIC ASSISTED NAVIGATIONAL BRONCHOSCOPY-bilateral as a surgical intervention.  The patient's history has been reviewed, patient examined, no change in status, stable for surgery.  I have reviewed the patient's chart and labs.  Questions were answered to the patient's satisfaction.  Benefits, limitations and potential complications of the procedure were discussed with the patient/family.  Complications from bronchoscopy are rare and most often minor, but if they occur they may include breathing difficulty, vocal cord spasm, hoarseness, slight fever, vomiting, dizziness, bronchospasm, infection, low blood oxygen, bleeding from biopsy site, or an allergic reaction to medications.  It is uncommon for patients to experience other more serious complications for example: Collapsed lung requiring chest tube placement, respiratory failure, heart attack and/or cardiac arrhythmia.  Patient agrees to proceed.  [name], MD Advanced Bronchoscopy PCCM [facility]    *This note was generated using voice recognition software/Dragon and/or AI transcription program.  Despite best efforts to proofread, errors can occur which can change the meaning. Any transcriptional errors that result from this process are unintentional and may not be fully corrected at the time of dictation.

## 2023-09-30 NOTE — Anesthesia Preprocedure Evaluation (Signed)
 Anesthesia Evaluation  Patient identified by MRN, date of birth, ID band Patient awake    Reviewed: Allergy & Precautions, NPO status , Patient's Chart, lab work & pertinent test results  History of Anesthesia Complications Negative for: history of anesthetic complications  Airway Mallampati: IV   Neck ROM: Full    Dental  (+) Missing Bridge :   Pulmonary sleep apnea , former smoker (quit 40 years ago)   Pulmonary exam normal breath sounds clear to auscultation       Cardiovascular hypertension, + Peripheral Vascular Disease  Normal cardiovascular exam Rhythm:Regular Rate:Normal  ECG 09/26/23: normal   Neuro/Psych negative neurological ROS     GI/Hepatic hiatal hernia,GERD  ,,  Endo/Other  Obesity   Renal/GU negative Renal ROS     Musculoskeletal   Abdominal   Peds  Hematology negative hematology ROS (+)   Anesthesia Other Findings   Reproductive/Obstetrics                             Anesthesia Physical Anesthesia Plan  ASA: 2  Anesthesia Plan: General   Post-op Pain Management:    Induction: Intravenous  PONV Risk Score and Plan: 3 and Ondansetron , Dexamethasone  and Treatment may vary due to age or medical condition  Airway Management Planned: Oral ETT  Additional Equipment:   Intra-op Plan:   Post-operative Plan: Extubation in OR  Informed Consent: I have reviewed the patients History and Physical, chart, labs and discussed the procedure including the risks, benefits and alternatives for the proposed anesthesia with the patient or authorized representative who has indicated his/her understanding and acceptance.     Dental advisory given  Plan Discussed with: CRNA  Anesthesia Plan Comments: (Patient consented for risks of anesthesia including but not limited to:  - adverse reactions to medications - damage to eyes, teeth, lips or other oral mucosa - nerve damage  due to positioning  - sore throat or hoarseness - damage to heart, brain, nerves, lungs, other parts of body or loss of life  Informed patient about role of CRNA in peri- and intra-operative care.  Patient voiced understanding.)       Anesthesia Quick Evaluation

## 2023-09-30 NOTE — Op Note (Signed)
 PROCEDURES Robotic assisted bronchoscopy Augmented fluoroscopy Endobronchial ultrasound examination of the mediastinum   Indication: Bilateral lung nodules.  Enlarging left upper lobe part solid nodule 1.8 x 2.1 cm with a 9 mm solid component.  Irregular posterior segment right upper lobe nodule measures 10 x 13 mm however only a few millimeters thick, this nodule is adjacent to the fissure.  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  Preoperative Diagnosis: Bilateral lung nodules, rule out cancer Post Procedure Diagnosis: Same as above Consent: Verbal/Written: obtained  Benefits, limitations and potential complications of the procedure were discussed with the patient/family.  Complications from bronchoscopy are rare and most often minor, but if they occur they may include breathing difficulty, vocal cord spasm, hoarseness, slight fever, vomiting, dizziness, bronchospasm, infection, low blood oxygen, bleeding from biopsy site, or an allergic reaction to medications.  It is uncommon for patients to experience other more serious complications for example: Collapsed lung requiring chest tube placement, respiratory failure, heart attack and/or cardiac arrhythmia.  Patient understood the potential complications and agreed to proceed.  Surgeon: [name], MD Assistant/Scrub: Ubaldo Corti, RRT Circulator: N/A Anesthesiologist/CRNA: Alfonso Ruths, MD/Stephanie Nada, CRNA Cytotechnology: [place], team lead  Fluoroscopy technician: Morna Hurst, RT Representatives: Arvella Mirza, Jaquita Salisbury ([employer])  Type of Anesthesia: General endotracheal  Procedures Performed:   Robotic bronchoscopy: Procedure consists of robotic navigation comprised of electromagnetics, optical pattern recognition and robotic kinematic data - to triangulate bronchoscope location during the procedure and provide accurate positional data to biopsy a lesion. Augmented fluoroscopy with Body  Vision. Endobronchial ultrasound examination of the mediastinum  Description of Procedure:  Robotic bronchoscopy: The patient was brought to Procedure Room 2 (Bronchoscopy Suite) in the OR area where appropriate timeout was taken with the staff after the patient was inducted under general anesthesia.  The patient was inducted under general anesthesia and intubated by the anesthesia team.  Patient was intubated with a 8.5 ET tube without difficulty.  Tube was secured at 4 cm above the carina.  A Portex adapter was placed on the ET tube flange.  Once the patient was under adequate general anesthesia the Olympus therapeutic video bronchoscope was advanced and an anatomic airway tour and surveillance bronchoscopy was performed.The distal trachea appeared unremarkable. The main carina was sharp.  No secretions were seen in either right or left mainstem bronchi. The RUL, RLL, RML appeared to be free of endobronchial masses, lesions, or purulent secretions. Likewise, the LLL/LUL appeared to be free of endobronchial masses, lesions, or purulent secretions. Once the survey bronchoscopy was completed, registration for the augmented fluoroscopy (Body Vision) was then performed with the fluoroscopic C arm.  Registration of the 2 lesions showed that the left upper lobe lesion had good surface area for biopsy however the right upper lobe lesion was very flat and adjacent to the fissure making it a high risk for pneumothorax.  At this point decided to biopsy only the left upper lobe lesion. Once the registration was completed, the robotic bronchoscope ET tube adapter was placed and ETT was cut to proper length and secured on the mid plane.  The [facility] robotic scope was then advanced through the ETT and registration was performed successfully.  There was good correlation between the robotic mapping and bronchoscopic mapping. With the assistance of fused navigation, the bronchoscope was advanced to the LUL nodule/mass. The tip  of the working channel sat within 26 mm of the nodule  Positioning was confirmed with augmented fluoroscopy.  Augmented fluoroscopy via Body Vision was utilized  to optimize the position most favorable for biopsies, then the robotic bronchoscope was anchored to maintain position.  A total of 8 transbronchial biopsies were obtained at this point, ROSE was performed on the second biopsy specimen.  ROSE was consistent with lesional cells.  After confirmation of excellent hemostasis, a gated BAL was also obtained at this segment, which sent for cytology analysis.  For BAL sample acquisition purposes, 20 ml of normal saline were instilled, and approximately 6 ml were recovered/trapped and sent for analysis.   The robotic bronchoscope was then retracted all the way out after confirmation of excellent hemostasis.  At this point a Olympus endobronchial ultrasound scope was inserted to examine the mediastinum.  Precarinal, subcarinal, and hilar areas were examined with no significant adenopathy noted.  At this point the endobronchial ultrasound scope was retrieved and the patient received bronchial lavage with 9 mL of 1% lidocaine  via the ET tube. The patient tolerated the procedure well. No significant bleeding was observed at the conclusion of the procedure.  At this point, the patient was allowed to emerge from general anesthesia, and was extubated in the procedure room without incident.  The patient  was taken to the PACU in satisfactory condition.  Auscultation of the lungs showed no change from pre bronchoscopy examination.  Patient tolerated the procedure well with no untoward effects of anesthesia noted.   Specimens Obtained:  Transbronchial Forceps Biopsy: X 8, LUL  Transbronchial Brush: N/A  Targeted BAL: LUL, 6 mL aliquot  Fluoroscopy: Augmented fluoroscopy (Body Vision) was utilized during the course of this procedure to assure that biopsies were taken in a safe manner under fluoroscopic guidance with  spot films required.  Total fluoroscopy time: 4 minutes 54 seconds, total dose: 61.6 mGy.  Intraoperative images:         Complications:None, no pneumothorax on post film:   Estimated Blood Loss: Nil    Assessment and Plan/Additional Comments: Bilateral lung nodules, left upper lobe nodule biopsied, suspicious for carcinoma, status post robotic assisted navigational bronchoscopy. Await pathology reports.    [name], MD Advanced Bronchoscopy PCCM [facility]    *This note was dictated using voice recognition software/Dragon.  Despite best efforts to proofread, errors can occur which can change the meaning.  Any change was purely unintentional.

## 2023-09-30 NOTE — Transfer of Care (Signed)
 Immediate Anesthesia Transfer of Care Note  Patient: Teresa Sparks  Procedure(s) Performed: ROBOTIC ASSISTED NAVIGATIONAL BRONCHOSCOPY (Bilateral) ENDOBRONCHIAL ULTRASOUND (Bilateral)  Patient Location: PACU  Anesthesia Type:General  Level of Consciousness: awake, alert , oriented, and patient cooperative  Airway & Oxygen Therapy: Patient Spontanous Breathing and Patient connected to face mask oxygen  Post-op Assessment: Report given to RN, Post -op Vital signs reviewed and stable, and Patient moving all extremities  Post vital signs: Reviewed and stable  Last Vitals:  Vitals Value Taken Time  BP 98/53 09/30/23 1406  Temp    Pulse 92 09/30/23 1415  Resp 17 09/30/23 1415  SpO2 100 % 09/30/23 1415  Vitals shown include unfiled device data.  Last Pain:  Vitals:   09/30/23 1114  TempSrc: Temporal  PainSc: 6          Complications: No notable events documented.

## 2023-09-30 NOTE — Anesthesia Procedure Notes (Addendum)
 Procedure Name: Intubation Date/Time: 09/30/2023 12:38 PM  Performed by: Nada Corean CROME, CRNAPre-anesthesia Checklist: Patient identified, Emergency Drugs available, Suction available, Patient being monitored and Timeout performed Patient Re-evaluated:Patient Re-evaluated prior to induction Oxygen Delivery Method: Circle system utilized Preoxygenation: Pre-oxygenation with 100% oxygen Induction Type: IV induction Laryngoscope Size: McGrath and 3 Grade View: Grade II Tube type: Oral Tube size: 8.0 mm Number of attempts: 3 Airway Equipment and Method: Patient positioned with wedge pillow, Video-laryngoscopy and Bougie stylet Placement Confirmation: ETT inserted through vocal cords under direct vision, positive ETCO2, breath sounds checked- equal and bilateral and CO2 detector Secured at: 24 cm Tube secured with: Tape Dental Injury: Teeth and Oropharynx as per pre-operative assessment  Difficulty Due To: Difficult Airway- due to anterior larynx Comments: ETT size 8.5 downsized to 8.0

## 2023-09-30 NOTE — Telephone Encounter (Signed)
 Patient called again stating she is still in pain and having heartburn and acid reflux. She wants to know what she need to do.  Per the last note, she had a US done, but no follow up appt.   Please advise

## 2023-09-30 NOTE — Telephone Encounter (Signed)
 They scheduled her for an [place] only, however she needs a visit to be seen by me or GS.

## 2023-10-02 ENCOUNTER — Encounter: Payer: Self-pay | Admitting: Pulmonary Disease

## 2023-10-03 ENCOUNTER — Emergency Department: Payer: Medicare Other

## 2023-10-03 ENCOUNTER — Inpatient Hospital Stay
Admission: EM | Admit: 2023-10-03 | Discharge: 2023-10-05 | DRG: 264 | Disposition: A | Discharge status: Home / Self Care | Payer: Medicare Other | Attending: Obstetrics and Gynecology | Admitting: Obstetrics and Gynecology

## 2023-10-03 ENCOUNTER — Encounter: Payer: Self-pay | Admitting: Pulmonary Disease

## 2023-10-03 ENCOUNTER — Other Ambulatory Visit: Payer: Self-pay

## 2023-10-03 DIAGNOSIS — F411 Generalized anxiety disorder: Secondary | ICD-10-CM | POA: Diagnosis present

## 2023-10-03 DIAGNOSIS — G8929 Other chronic pain: Secondary | ICD-10-CM | POA: Diagnosis not present

## 2023-10-03 DIAGNOSIS — Z803 Family history of malignant neoplasm of breast: Secondary | ICD-10-CM

## 2023-10-03 DIAGNOSIS — Z6833 Body mass index (BMI) 33.0-33.9, adult: Secondary | ICD-10-CM | POA: Diagnosis not present

## 2023-10-03 DIAGNOSIS — Z7982 Long term (current) use of aspirin: Secondary | ICD-10-CM

## 2023-10-03 DIAGNOSIS — E1151 Type 2 diabetes mellitus with diabetic peripheral angiopathy without gangrene: Secondary | ICD-10-CM | POA: Diagnosis present

## 2023-10-03 DIAGNOSIS — Z95828 Presence of other vascular implants and grafts: Secondary | ICD-10-CM | POA: Diagnosis not present

## 2023-10-03 DIAGNOSIS — E669 Obesity, unspecified: Secondary | ICD-10-CM | POA: Diagnosis present

## 2023-10-03 DIAGNOSIS — I774 Celiac artery compression syndrome: Secondary | ICD-10-CM | POA: Diagnosis present

## 2023-10-03 DIAGNOSIS — Z91041 Radiographic dye allergy status: Secondary | ICD-10-CM

## 2023-10-03 DIAGNOSIS — Z823 Family history of stroke: Secondary | ICD-10-CM | POA: Diagnosis not present

## 2023-10-03 DIAGNOSIS — K5909 Other constipation: Secondary | ICD-10-CM | POA: Diagnosis present

## 2023-10-03 DIAGNOSIS — Z820 Family history of epilepsy and other diseases of the nervous system: Secondary | ICD-10-CM

## 2023-10-03 DIAGNOSIS — Z87891 Personal history of nicotine dependence: Secondary | ICD-10-CM | POA: Diagnosis not present

## 2023-10-03 DIAGNOSIS — Z83719 Family history of colon polyps, unspecified: Secondary | ICD-10-CM

## 2023-10-03 DIAGNOSIS — I10 Essential (primary) hypertension: Secondary | ICD-10-CM | POA: Diagnosis not present

## 2023-10-03 DIAGNOSIS — Z860101 Personal history of adenomatous and serrated colon polyps: Secondary | ICD-10-CM

## 2023-10-03 DIAGNOSIS — R1013 Epigastric pain: Principal | ICD-10-CM

## 2023-10-03 DIAGNOSIS — Z885 Allergy status to narcotic agent status: Secondary | ICD-10-CM

## 2023-10-03 DIAGNOSIS — F32A Depression, unspecified: Secondary | ICD-10-CM | POA: Diagnosis not present

## 2023-10-03 DIAGNOSIS — E785 Hyperlipidemia, unspecified: Secondary | ICD-10-CM | POA: Diagnosis present

## 2023-10-03 DIAGNOSIS — Z8249 Family history of ischemic heart disease and other diseases of the circulatory system: Secondary | ICD-10-CM

## 2023-10-03 DIAGNOSIS — E66811 Obesity, class 1: Secondary | ICD-10-CM | POA: Diagnosis not present

## 2023-10-03 DIAGNOSIS — R1010 Upper abdominal pain, unspecified: Secondary | ICD-10-CM | POA: Diagnosis not present

## 2023-10-03 DIAGNOSIS — K219 Gastro-esophageal reflux disease without esophagitis: Secondary | ICD-10-CM | POA: Diagnosis not present

## 2023-10-03 DIAGNOSIS — R109 Unspecified abdominal pain: Secondary | ICD-10-CM | POA: Diagnosis present

## 2023-10-03 DIAGNOSIS — G4730 Sleep apnea, unspecified: Secondary | ICD-10-CM | POA: Diagnosis present

## 2023-10-03 DIAGNOSIS — Z880 Allergy status to penicillin: Secondary | ICD-10-CM

## 2023-10-03 DIAGNOSIS — Z79899 Other long term (current) drug therapy: Secondary | ICD-10-CM | POA: Diagnosis not present

## 2023-10-03 DIAGNOSIS — Z7984 Long term (current) use of oral hypoglycemic drugs: Secondary | ICD-10-CM

## 2023-10-03 DIAGNOSIS — Z90710 Acquired absence of both cervix and uterus: Secondary | ICD-10-CM

## 2023-10-03 DIAGNOSIS — I771 Stricture of artery: Principal | ICD-10-CM | POA: Diagnosis present

## 2023-10-03 DIAGNOSIS — M1990 Unspecified osteoarthritis, unspecified site: Secondary | ICD-10-CM | POA: Diagnosis present

## 2023-10-03 DIAGNOSIS — Z882 Allergy status to sulfonamides status: Secondary | ICD-10-CM

## 2023-10-03 DIAGNOSIS — K449 Diaphragmatic hernia without obstruction or gangrene: Secondary | ICD-10-CM | POA: Diagnosis present

## 2023-10-03 DIAGNOSIS — D3500 Benign neoplasm of unspecified adrenal gland: Secondary | ICD-10-CM | POA: Diagnosis present

## 2023-10-03 DIAGNOSIS — K529 Noninfective gastroenteritis and colitis, unspecified: Secondary | ICD-10-CM | POA: Diagnosis present

## 2023-10-03 DIAGNOSIS — Z888 Allergy status to other drugs, medicaments and biological substances status: Secondary | ICD-10-CM

## 2023-10-03 DIAGNOSIS — Z91013 Allergy to seafood: Secondary | ICD-10-CM

## 2023-10-03 DIAGNOSIS — R918 Other nonspecific abnormal finding of lung field: Secondary | ICD-10-CM | POA: Diagnosis present

## 2023-10-03 DIAGNOSIS — Z881 Allergy status to other antibiotic agents status: Secondary | ICD-10-CM

## 2023-10-03 LAB — BASIC METABOLIC PANEL
Anion gap: 13 (ref 5–15)
BUN: 12 mg/dL (ref 8–23)
CO2: 22 mmol/L (ref 22–32)
Calcium: 9.7 mg/dL (ref 8.9–10.3)
Chloride: 103 mmol/L (ref 98–111)
Creatinine, Ser: 1.04 mg/dL — ABNORMAL HIGH (ref 0.44–1.00)
GFR, Estimated: 58 mL/min — ABNORMAL LOW (ref >60)
Glucose, Bld: 91 mg/dL (ref 70–99)
Potassium: 3.8 mmol/L (ref 3.5–5.1)
Sodium: 138 mmol/L (ref 135–145)

## 2023-10-03 LAB — HEPATIC FUNCTION PANEL
ALT: 41 U/L (ref 0–44)
AST: 24 U/L (ref 15–41)
Albumin: 4 g/dL (ref 3.5–5.0)
Alkaline Phosphatase: 44 U/L (ref 38–126)
Bilirubin, Direct: <0.1 mg/dL (ref 0.0–0.2)
Total Bilirubin: 0.6 mg/dL (ref 0.0–1.2)
Total Protein: 7.1 g/dL (ref 6.5–8.1)

## 2023-10-03 LAB — CBC
HCT: 40.5 % (ref 36.0–46.0)
Hemoglobin: 12.9 g/dL (ref 12.0–15.0)
MCH: 24.9 pg — ABNORMAL LOW (ref 26.0–34.0)
MCHC: 31.9 g/dL (ref 30.0–36.0)
MCV: 78.2 fL — ABNORMAL LOW (ref 80.0–100.0)
Platelets: 316 10*3/uL (ref 150–400)
RBC: 5.18 MIL/uL — ABNORMAL HIGH (ref 3.87–5.11)
RDW: 14.9 % (ref 11.5–15.5)
WBC: 8.5 10*3/uL (ref 4.0–10.5)
nRBC: 0 % (ref 0.0–0.2)

## 2023-10-03 LAB — URINALYSIS, ROUTINE W REFLEX MICROSCOPIC
Bilirubin Urine: NEGATIVE
Glucose, UA: NEGATIVE mg/dL
Hgb urine dipstick: NEGATIVE
Ketones, ur: NEGATIVE mg/dL
Leukocytes,Ua: NEGATIVE
Nitrite: NEGATIVE
Protein, ur: NEGATIVE mg/dL
Specific Gravity, Urine: 1.003 — ABNORMAL LOW (ref 1.005–1.030)
pH: 7 (ref 5.0–8.0)

## 2023-10-03 LAB — TROPONIN I (HIGH SENSITIVITY)
Troponin I (High Sensitivity): 4 ng/L (ref <18)
Troponin I (High Sensitivity): 5 ng/L (ref <18)

## 2023-10-03 LAB — SURGICAL PATHOLOGY

## 2023-10-03 LAB — LIPASE, BLOOD: Lipase: 22 U/L (ref 11–51)

## 2023-10-03 LAB — LACTIC ACID, PLASMA: Lactic Acid, Venous: 1.2 mmol/L (ref 0.5–1.9)

## 2023-10-03 LAB — CYTOLOGY - NON PAP

## 2023-10-03 LAB — PROTIME-INR
INR: 1 (ref 0.8–1.2)
Prothrombin Time: 13.9 s (ref 11.4–15.2)

## 2023-10-03 MED ORDER — ASPIRIN 81 MG PO CHEW
81.0000 mg | CHEWABLE_TABLET | Freq: Every day | ORAL | Status: DC
Start: 2023-10-04 — End: 2023-10-05
  Administered 2023-10-04 – 2023-10-05 (×2): 81 mg via ORAL
  Filled 2023-10-03 (×2): qty 1

## 2023-10-03 MED ORDER — METOPROLOL SUCCINATE ER 25 MG PO TB24
25.0000 mg | ORAL_TABLET | Freq: Every day | ORAL | Status: DC
  Administered 2023-10-04 – 2023-10-05 (×2): 25 mg via ORAL
  Filled 2023-10-03 (×2): qty 1

## 2023-10-03 MED ORDER — ADULT MULTIVITAMIN W/MINERALS CH
1.0000 | ORAL_TABLET | Freq: Every day | ORAL | Status: DC
  Administered 2023-10-04 – 2023-10-05 (×2): 1 via ORAL
  Filled 2023-10-03 (×2): qty 1

## 2023-10-03 MED ORDER — ONDANSETRON HCL 4 MG/2ML IJ SOLN: 4.0000 mg | Freq: Four times a day (QID) | INTRAMUSCULAR | Status: DC | PRN

## 2023-10-03 MED ORDER — DIPHENHYDRAMINE HCL 25 MG PO CAPS
50.0000 mg | ORAL_CAPSULE | Freq: Once | ORAL | Status: AC
  Administered 2023-10-04: 50 mg via ORAL

## 2023-10-03 MED ORDER — FLUTICASONE PROPIONATE 50 MCG/ACT NA SUSP: 1.0000 | Freq: Every day | NASAL | Status: DC | PRN

## 2023-10-03 MED ORDER — PANTOPRAZOLE SODIUM 40 MG PO TBEC
80.0000 mg | DELAYED_RELEASE_TABLET | Freq: Two times a day (BID) | ORAL | Status: DC
  Administered 2023-10-03 – 2023-10-05 (×4): 80 mg via ORAL
  Filled 2023-10-03 (×4): qty 2

## 2023-10-03 MED ORDER — ATORVASTATIN CALCIUM 10 MG PO TABS
10.0000 mg | ORAL_TABLET | Freq: Every day | ORAL | Status: DC
  Administered 2023-10-04: 10 mg via ORAL
  Filled 2023-10-03: qty 1

## 2023-10-03 MED ORDER — FERROUS SULFATE 325 (65 FE) MG PO TABS
325.0000 mg | ORAL_TABLET | Freq: Every day | ORAL | Status: DC
  Administered 2023-10-04 – 2023-10-05 (×2): 325 mg via ORAL
  Filled 2023-10-03 (×2): qty 1

## 2023-10-03 MED ORDER — DIPHENHYDRAMINE HCL 25 MG PO CAPS: 50.0000 mg | ORAL_CAPSULE | Freq: Once | ORAL | Status: DC

## 2023-10-03 MED ORDER — DIPHENHYDRAMINE HCL 50 MG/ML IJ SOLN: 50.0000 mg | Freq: Once | INTRAMUSCULAR | Status: DC

## 2023-10-03 MED ORDER — HYDROMORPHONE HCL 1 MG/ML IJ SOLN: 0.5000 mg | INTRAMUSCULAR | Status: AC | PRN

## 2023-10-03 MED ORDER — HYDROCHLOROTHIAZIDE 12.5 MG PO TABS
12.5000 mg | ORAL_TABLET | Freq: Every day | ORAL | Status: DC
  Administered 2023-10-04 – 2023-10-05 (×2): 12.5 mg via ORAL
  Filled 2023-10-03 (×2): qty 1

## 2023-10-03 MED ORDER — ACETAMINOPHEN 325 MG PO TABS
650.0000 mg | ORAL_TABLET | Freq: Four times a day (QID) | ORAL | Status: DC | PRN
  Filled 2023-10-03: qty 2

## 2023-10-03 MED ORDER — MORPHINE SULFATE (PF) 4 MG/ML IV SOLN
4.0000 mg | Freq: Once | INTRAVENOUS | Status: AC
  Administered 2023-10-03: 4 mg via INTRAVENOUS
  Filled 2023-10-03: qty 1

## 2023-10-03 MED ORDER — ONDANSETRON HCL 4 MG/2ML IJ SOLN
4.0000 mg | INTRAMUSCULAR | Status: AC
  Administered 2023-10-03: 4 mg via INTRAVENOUS
  Filled 2023-10-03: qty 2

## 2023-10-03 MED ORDER — MORPHINE SULFATE (PF) 2 MG/ML IV SOLN
2.0000 mg | INTRAVENOUS | Status: AC | PRN
  Filled 2023-10-03: qty 1

## 2023-10-03 MED ORDER — MORPHINE SULFATE (PF) 4 MG/ML IV SOLN
4.0000 mg | INTRAVENOUS | Status: DC | PRN
  Administered 2023-10-03 – 2023-10-04 (×2): 4 mg via INTRAVENOUS
  Filled 2023-10-03 (×2): qty 1

## 2023-10-03 MED ORDER — HYDRALAZINE HCL 20 MG/ML IJ SOLN
5.0000 mg | Freq: Four times a day (QID) | INTRAMUSCULAR | Status: DC | PRN
  Filled 2023-10-03: qty 1

## 2023-10-03 MED ORDER — HYDRALAZINE HCL 50 MG PO TABS
50.0000 mg | ORAL_TABLET | Freq: Two times a day (BID) | ORAL | Status: DC
  Administered 2023-10-03 – 2023-10-04 (×3): 50 mg via ORAL
  Filled 2023-10-03 (×4): qty 1

## 2023-10-03 MED ORDER — LISINOPRIL-HYDROCHLOROTHIAZIDE 10-12.5 MG PO TABS: 1.0000 | ORAL_TABLET | Freq: Every day | ORAL | Status: DC

## 2023-10-03 MED ORDER — PREDNISONE 20 MG PO TABS: 50.0000 mg | ORAL_TABLET | Freq: Four times a day (QID) | ORAL | Status: DC

## 2023-10-03 MED ORDER — ONDANSETRON HCL 4 MG PO TABS: 4.0000 mg | ORAL_TABLET | Freq: Four times a day (QID) | ORAL | Status: DC | PRN

## 2023-10-03 MED ORDER — ACETAMINOPHEN 650 MG RE SUPP: 650.0000 mg | Freq: Four times a day (QID) | RECTAL | Status: DC | PRN

## 2023-10-03 MED ORDER — CYCLOBENZAPRINE HCL 10 MG PO TABS: 5.0000 mg | ORAL_TABLET | ORAL | Status: DC | PRN

## 2023-10-03 MED ORDER — DIPHENHYDRAMINE HCL 50 MG/ML IJ SOLN: 50.0000 mg | Freq: Once | INTRAMUSCULAR | Status: AC

## 2023-10-03 MED ORDER — PREDNISONE 50 MG PO TABS
50.0000 mg | ORAL_TABLET | Freq: Four times a day (QID) | ORAL | Status: AC
  Administered 2023-10-04 (×3): 50 mg via ORAL
  Filled 2023-10-03 (×3): qty 1

## 2023-10-03 MED ORDER — LISINOPRIL 10 MG PO TABS
10.0000 mg | ORAL_TABLET | Freq: Every day | ORAL | Status: DC
  Administered 2023-10-04: 10 mg via ORAL
  Filled 2023-10-03 (×2): qty 1

## 2023-10-03 NOTE — Assessment & Plan Note (Addendum)
 Home PPI equivalent resumed on admission

## 2023-10-03 NOTE — H&P (View-Only) (Signed)
 Hospital Consult    Reason for Consult:  Abdominal Pain for two weeks.  Requesting Physician:  Dr Oneil Budge MD MRN #:  [id number]  History of Present Illness: [name] who presents to [facility] emergency department with complaints of abdominal pain for the past 2 weeks.  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  Patient reports that on September 30, 2023 she underwent pulmonary nodule biopsies in which she had stopped her aspirin  81 mg daily for 6 days prior and restarted 1 day afterwards.  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  Vascular surgery was consulted to evaluate.  Past Medical History:  Diagnosis Date   Adenomatous colon polyp    Adrenal adenoma    Allergic genetic state    Arthritis    Chronic abdominal pain    GERD (gastroesophageal reflux disease)    Hiatal hernia    Hypertension    Sleep apnea     Past Surgical History:  Procedure Laterality Date   ABDOMINAL HYSTERECTOMY     CHOLECYSTECTOMY     COLONOSCOPY     COLONOSCOPY WITH PROPOFOL  N/A 07/24/2018   Procedure: COLONOSCOPY WITH PROPOFOL ;  Surgeon: Viktoria Lamar DASEN, MD;  Location: [facility] ENDOSCOPY;  Service: Endoscopy;  Laterality: N/A;   ENDOBRONCHIAL ULTRASOUND Bilateral 09/30/2023   Procedure: ENDOBRONCHIAL ULTRASOUND;  Surgeon: Tamea Dedra CROME, MD;  Location: [facility] ORS;  Service: Pulmonary;  Laterality: Bilateral;   FRACTURE SURGERY     JOINT REPLACEMENT Right    6 years ago   VISCERAL ANGIOGRAPHY N/A 02/05/2022   Procedure: VISCERAL ANGIOGRAPHY;  Surgeon: Jama Cordella MATSU, MD;  Location: [facility] INVASIVE CV LAB;  Service: Cardiovascular;  Laterality: N/A;    Allergies  Allergen Reactions   Iodinated Contrast Media Anaphylaxis    Other reaction(s): Vomiting   Ciprofloxacin Diarrhea and Nausea Only    Nausea, vomiting  Other  reaction(s): Vomiting   Codeine Sulfate [Codeine] Diarrhea    Nausea, vomiting   Meloxicam    Penicillins Nausea Only   Protonix  [Pantoprazole  Sodium] Hives   Shellfish Allergy Swelling   Sulfa Antibiotics Rash    Prior to Admission medications   Medication Sig Start Date End Date Taking? Authorizing Provider  acetaminophen  (TYLENOL ) 650 MG CR tablet Take 650 mg by mouth every 8 (eight) hours as needed for pain.    [provider]  aspirin  81 MG chewable tablet Chew by mouth daily.    [provider]  atorvastatin  (LIPITOR) 10 MG tablet Take 1 tablet (10 mg total) by mouth daily at 6 PM. 02/06/22 09/30/23  Von Bellis, MD  cetirizine (ZYRTEC) 10 MG tablet Take 10 mg by mouth daily.    [provider]  colchicine  0.6 MG tablet Take by mouth. 10/14/21   [provider]  cyclobenzaprine  (FLEXERIL ) 5 MG tablet Take 5 mg by mouth as needed. 02/24/23   [provider]  DULoxetine  (CYMBALTA ) 30 MG capsule Take 30 mg by mouth daily.    [provider]  famotidine  (PEPCID ) 20 MG tablet Take 1 tablet (20 mg total) by mouth daily for 10 days. 06/26/21 09/30/23  Angelena Smalls, MD  ferrous sulfate  325 (65 FE) MG tablet Take 325 mg by mouth daily with breakfast.    [provider]  fluticasone  (FLONASE ) 50 MCG/ACT nasal spray Place into both nostrils daily.  [provider]  gabapentin  (NEURONTIN ) 300 MG capsule Take 300 mg by mouth 2 (two) times daily. Patient not taking: Reported on 09/26/2023    [provider]  hydrALAZINE  (APRESOLINE ) 50 MG tablet Take 50 mg by mouth 2 (two) times daily.    [provider]  lisinopril -hydrochlorothiazide  (ZESTORETIC ) 10-12.5 MG tablet Take 1 tablet by mouth daily. 10/13/21   [provider]  metoprolol  succinate (TOPROL -XL) 25 MG 24 hr tablet Take 25 mg by mouth daily. 10/04/22   [provider]  Multiple Vitamins-Minerals (CENTRUM ADULTS PO) Take by mouth daily.     [provider]  omeprazole  (PRILOSEC) 40 MG capsule Take 40 mg by mouth 2 (two) times daily.    [provider]  PARoxetine  (PAXIL ) 10 MG tablet Take 1 tablet by mouth daily at 6 (six) AM. 11/06/21   [provider]  Semaglutide-Weight Management 0.25 MG/0.5ML SOAJ Inject 0.25 mg into the skin. Patient not taking: Reported on 09/30/2023    [provider]    Social History   Socioeconomic History   Marital status: Married    Spouse name: Not on file   Number of children: Not on file   Years of education: Not on file   Highest education level: Not on file  Occupational History   Not on file  Tobacco Use   Smoking status: Former    Average packs/day: 0.3 packs/day for 2.0 years (0.5 ttl pk-yrs)    Types: Cigarettes    Start date: 8   Smokeless tobacco: Never  Vaping Use   Vaping status: Never Used  Substance and Sexual Activity   Alcohol use: No   Drug use: Never   Sexual activity: Not on file  Other Topics Concern   Not on file  Social History Narrative   Not on file   Social Drivers of Health   Financial Resource Strain: Not on file  Food Insecurity: Not on file  Transportation Needs: Not on file  Physical Activity: Not on file  Stress: Not on file  Social Connections: Not on file  Intimate Partner Violence: Not on file     Family History  Problem Relation Age of Onset   Hypertension Mother    Stroke Mother    Epilepsy Mother    Colon polyps Sister    Breast cancer Other     ROS: Otherwise negative unless mentioned in HPI  Physical Examination  Vitals:   10/03/23 1358  BP: (!) 174/91  Pulse: 70  Resp: 18  Temp: 98.6 F (37 C)  SpO2: 98%   Body mass index is 31.45 kg/m.  General:  WDWN in NAD Gait: Not observed HENT: WNL, normocephalic Pulmonary: normal non-labored breathing, without Rales, rhonchi,  wheezing Cardiac: regular, without  Murmurs, rubs or gallops; without carotid bruits Abdomen: Positive  bowel Sounds throughout,  soft, NT/ND, no masses Skin: without rashes Vascular Exam/Pulses: Palpable pulses throughout.  Extremities: without ischemic changes, without Gangrene , without cellulitis; without open wounds;  Musculoskeletal: no muscle wasting or atrophy  Neurologic: A&O X 3;  No focal weakness or paresthesias are detected; speech is fluent/normal Psychiatric:  The pt has Normal affect. Lymph:  Unremarkable  CBC    Component Value Date/Time   WBC 8.5 10/03/2023 1400   RBC 5.18 (H) 10/03/2023 1400   HGB 12.9 10/03/2023 1400   HGB 10.8 (L) 05/08/2014 0151   HCT 40.5 10/03/2023 1400   HCT 35.5 05/08/2014 0151   PLT 316 10/03/2023 1400  PLT 308 05/08/2014 0151   MCV 78.2 (L) 10/03/2023 1400   MCV 76 (L) 05/08/2014 0151   MCH 24.9 (L) 10/03/2023 1400   MCHC 31.9 10/03/2023 1400   RDW 14.9 10/03/2023 1400   RDW 16.3 (H) 05/08/2014 0151   LYMPHSABS 1.7 02/04/2022 0637   LYMPHSABS 3.4 05/08/2014 0151   MONOABS 0.5 02/04/2022 0637   MONOABS 0.5 05/08/2014 0151   EOSABS 0.1 02/04/2022 0637   EOSABS 0.1 05/08/2014 0151   BASOSABS 0.1 02/04/2022 0637   BASOSABS 0.1 05/08/2014 0151    BMET    Component Value Date/Time   NA 138 10/03/2023 1400   NA 138 05/08/2014 0151   K 3.8 10/03/2023 1400   K 3.8 05/08/2014 0151   CL 103 10/03/2023 1400   CL 106 05/08/2014 0151   CO2 22 10/03/2023 1400   CO2 23 05/08/2014 0151   GLUCOSE 91 10/03/2023 1400   GLUCOSE 97 05/08/2014 0151   BUN 12 10/03/2023 1400   BUN 16 05/08/2014 0151   CREATININE 1.04 (H) 10/03/2023 1400   CREATININE 1.25 05/08/2014 0151   CALCIUM  9.7 10/03/2023 1400   CALCIUM  8.8 05/08/2014 0151   GFRNONAA 58 (L) 10/03/2023 1400   GFRNONAA 46 (L) 05/08/2014 0151   GFRAA 56 (L) 04/23/2018 0901   GFRAA 54 (L) 05/08/2014 0151    COAGS: Lab Results  Component Value Date   INR 1.0 10/03/2023     Non-Invasive Vascular Imaging:   Aortic Ultrasound with Non contrasted CT of the abdomen ordered.    ABDOMINAL VISCERAL   Patient Name:  Teresa Sparks  Date of Exam:   09/20/2023  Medical Rec #: [id number]            Accession #:    [id number]  Date of Birth: [birth year]            Patient Gender: F  Patient Age:   28 years  Exam Location:  [facility]  Procedure:      VAS US  MESENTERIC  Referring Phys: [id number] ORVIN BRAVO BROWN    ---------------------------------------------------------------------------  -----   High Risk Factors: Hypertension, past history of smoking.   Other Factors: Post prandial pain and nausea one week.   Vascular Interventions: Celiac artery stent on 02/05/22.   Performing Technologist: Donnice Charnley RVT     Examination Guidelines: A complete evaluation includes B-mode imaging,  spectral  Doppler, color Doppler, and power Doppler as needed of all accessible  portions  of each vessel. Bilateral testing is considered an integral part of a  complete  examination. Limited examinations for reoccurring indications may be  performed  as noted.     Duplex Findings:  +----------------------+--------+--------+------+--------+  Mesenteric           PSV cm/sEDV cm/sPlaqueComments  +----------------------+--------+--------+------+--------+  Aorta Prox               86                           +----------------------+--------+--------+------+--------+  Celiac Artery Origin    224      41          stent    +----------------------+--------+--------+------+--------+  Celiac Artery Proximal  193      35                   +----------------------+--------+--------+------+--------+  SMA Origin              179  29                   +----------------------+--------+--------+------+--------+  SMA Proximal            180      32                   +----------------------+--------+--------+------+--------+  SMA Mid                 176      27                    +----------------------+--------+--------+------+--------+  CHA                    179      40                   +----------------------+--------+--------+------+--------+  Splenic                123      29                   +----------------------+--------+--------+------+--------+  IMA                     71      11                   +----------------------+--------+--------+------+--------+   Summary:  Largest Aortic Diameter: 2.2 cm    Mesenteric:  Normal Splenic artery, Hepatic artery, Inferior Mesenteric artery and  Superior  Mesenteric artery findings. 70 to 99% stenosis in the celiac artery.    *See table(s) above for measurements and observations.    Diagnosing physician: Selinda Gu MD    Electronically signed by Selinda Gu MD on 09/23/2023 at 10:52:00 AM.   Statin:  Yes.   Beta Blocker:  Yes.   Aspirin :  Yes.   ACEI:  Yes.   ARB:  No. CCB use:  No Other antiplatelets/anticoagulants:  No.    ASSESSMENT/PLAN: [name] who presents to [facility] emergency department with 2 weeks of worsening abdominal pain.  Patient has a history of celiac artery stent placement back on Feb 05, 2022 with Dr. Cordella Shawl MD.  Patient recently underwent pulmonary nodule biopsies on 09/30/2023 in which she was asked to hold her daily aspirin  for 6 days prior and restarted 1 day afterwards.  Patient also underwent vascular ultrasound in our office on 09/20/2023 which reveals celiac artery stenosis.  Therefore, vascular surgery plans on taking the patient to the vascular lab tomorrow on 10/04/2023 for visceral angiogram to reassess celiac artery stent placement.  I discussed in detail with the patient the procedure, benefits, risk, and complications.  Patient verbalized her understanding and wishes to proceed as soon as possible.  I answered all the patient's questions this afternoon.  Patient endorses that she has a reaction to contrast dye and therefore contrast dye  allergy protocol has been ordered as premedication for procedure tomorrow.  Patient was also made n.p.o. for the procedure tomorrow.   I discussed the plan in detail with Dr. Cordella Shawl MD and he agrees with the plan.   Gwendlyn JONELLE [facility] 10/03/2023 3:35 PM

## 2023-10-03 NOTE — ED Triage Notes (Signed)
 Pt sts that she has been having left side abd pain for the last two weeks. Pt sts that last may she had a stent placed for chronic venous mesenteric ischemia. Pt sts that she has been having nausea and diarrhea. Pt sts that she has been feeling a pulse in her abd on the left side also.

## 2023-10-03 NOTE — Assessment & Plan Note (Signed)
 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

## 2023-10-03 NOTE — ED Provider Notes (Signed)
 [facility] Provider Note    Event Date/Time   First MD Initiated Contact with Patient 10/03/23 1502     (approximate)   History   Abdominal Pain   HPI  Teresa Sparks is a 73 y.o. female who on review of note from December 30 history and physical has a history of mesenteric stent placement as well as lung nodules   Experiencing upper abdominal pain crampy in nature ongoing for about 2 to 3 weeks and reports it feels about the same as the pain she experienced when she had up having a stent put in.  Nausea no vomiting no fevers or chills no chest pain no shortness of breath  Physical Exam   Triage Vital Signs: ED Triage Vitals  Encounter Vitals Group     BP 10/03/23 1358 (!) 174/91     Systolic BP Percentile --      Diastolic BP Percentile --      Pulse Rate 10/03/23 1358 70     Resp 10/03/23 1358 18     Temp 10/03/23 1358 98.6 F (37 C)     Temp Source 10/03/23 1358 Oral     SpO2 10/03/23 1358 98 %     Weight 10/03/23 1359 213 lb (96.6 kg)     Height 10/03/23 1359 5' 9 (1.753 m)     Head Circumference --      Peak Flow --      Pain Score 10/03/23 1358 7     Pain Loc --      Pain Education --      Exclude from Growth Chart --     Most recent vital signs: Vitals:   10/03/23 1358  BP: (!) 174/91  Pulse: 70  Resp: 18  Temp: 98.6 F (37 C)  SpO2: 98%     General: Awake, no distress.  CV:  Good peripheral perfusion.  Normal tones and rate Resp:  Normal effort.  Bilateral Abd:  No distention.  Tenderness in the epigastrium only.  No rebound or guarding.  Perhaps moderate in intensity no lower abdominal tenderness Other:  Well-perfused lower extremities   ED Results / Procedures / Treatments   Labs (all labs ordered are listed, but only abnormal results are displayed) Labs Reviewed  BASIC METABOLIC PANEL - Abnormal; Notable for the following components:      Result Value   Creatinine, Ser 1.04 (*)    GFR, Estimated 58 (*)     All other components within normal limits  CBC - Abnormal; Notable for the following components:   RBC 5.18 (*)    MCV 78.2 (*)    MCH 24.9 (*)    All other components within normal limits  URINALYSIS, ROUTINE W REFLEX MICROSCOPIC - Abnormal; Notable for the following components:   Color, Urine COLORLESS (*)    APPearance CLEAR (*)    Specific Gravity, Urine 1.003 (*)    All other components within normal limits  PROTIME-INR  LACTIC ACID, PLASMA  HEPATIC FUNCTION PANEL  LIPASE, BLOOD  HIV ANTIBODY (ROUTINE TESTING W REFLEX)  BASIC METABOLIC PANEL  CBC  TROPONIN I (HIGH SENSITIVITY)  TROPONIN I (HIGH SENSITIVITY)     EKG  Inter by me at 1420 heart rate 70 QRS 90 QTc 450 Normal sinus rhythm, mild nonspecific T wave abnormality.  No evidence that would suggest obvious acute ischemia.  Additionally the patient denies having chest pain, her troponin is normal.  Symptoms have been present for about 2  to 3 weeks making ACS seem highly unlikely, specially in the setting of her reproducible epigastric pain   RADIOLOGY  CT ABDOMEN PELVIS WO CONTRAST Result Date: 10/03/2023 CLINICAL DATA:  Epigastric and left-sided abdominal pain for 2 weeks. Nausea. Diarrhea. EXAM: CT ABDOMEN AND PELVIS WITHOUT CONTRAST TECHNIQUE: Multidetector CT imaging of the abdomen and pelvis was performed following the standard protocol without IV contrast. RADIATION DOSE REDUCTION: This exam was performed according to the departmental dose-optimization program which includes automated exposure control, adjustment of the mA and/or kV according to patient size and/or use of iterative reconstruction technique. COMPARISON:  02/04/2022 FINDINGS: Lower chest: No acute findings. Hepatobiliary: No mass visualized on this unenhanced exam. Prior cholecystectomy. No evidence of biliary obstruction. Pancreas: No mass or inflammatory process visualized on this unenhanced exam. Spleen:  Within normal limits in size. Adrenals/Urinary  tract: 1.9 cm low-attenuation left adrenal mass remains stable, consistent with benign adenoma (No followup imaging is recommended) . No evidence of urolithiasis or hydronephrosis. Unremarkable unopacified urinary bladder. Stomach/Bowel: No evidence of obstruction, inflammatory process, or abnormal fluid collections. Normal appendix visualized. Vascular/Lymphatic: No pathologically enlarged lymph nodes identified. No evidence of abdominal aortic aneurysm. Reproductive: Prior hysterectomy noted. Adnexal regions are unremarkable in appearance. Other:  None. Musculoskeletal:  No suspicious bone lesions identified. IMPRESSION: No acute findings or other significant abnormality. Electronically Signed   By: Norleen DELENA Kil M.D.   On: 10/03/2023 16:45   DG Chest 2 View Result Date: 10/03/2023 CLINICAL DATA:  Left-sided abdominal pain for 2 weeks EXAM: CHEST - 2 VIEW COMPARISON:  09/30/2023 FINDINGS: Apical lordotic positioning on the frontal. Midline trachea. Normal heart size. Atherosclerosis in the transverse aorta. No pleural effusion or pneumothorax. Clear lungs. No free intraperitoneal air. Cholecystectomy clips. IMPRESSION: No active cardiopulmonary disease. Aortic Atherosclerosis (ICD10-I70.0). Electronically Signed   By: Rockey Kilts M.D.   On: 10/03/2023 14:55     Chest x-ray interpreted by me as clear lungs   The patient self-reports a severe allergy to iodinated contrast, also listed as anaphylaxis in her chart.  Given the ongoing nature of her symptoms for 2 to 3 weeks now, I think the risks of iodinated contrast outweigh the benefits.  PROCEDURES:  Critical Care performed: No  Procedures   MEDICATIONS ORDERED IN ED: Medications  predniSONE  (DELTASONE ) tablet 50 mg (has no administration in time range)  diphenhydrAMINE  (BENADRYL ) capsule 50 mg (has no administration in time range)    Or  diphenhydrAMINE  (BENADRYL ) injection 50 mg (has no administration in time range)  acetaminophen   (TYLENOL ) tablet 650 mg (has no administration in time range)    Or  acetaminophen  (TYLENOL ) suppository 650 mg (has no administration in time range)  ondansetron  (ZOFRAN ) tablet 4 mg (has no administration in time range)    Or  ondansetron  (ZOFRAN ) injection 4 mg (has no administration in time range)  morphine  (PF) 2 MG/ML injection 2 mg (has no administration in time range)  morphine  (PF) 4 MG/ML injection 4 mg (has no administration in time range)  HYDROmorphone  (DILAUDID ) injection 0.5 mg (has no administration in time range)  morphine  (PF) 4 MG/ML injection 4 mg (4 mg Intravenous Given 10/03/23 1539)  ondansetron  (ZOFRAN ) injection 4 mg (4 mg Intravenous Given 10/03/23 1540)     IMPRESSION / MDM / ASSESSMENT AND PLAN / ED COURSE  I reviewed the triage vital signs and the nursing notes.  Differential diagnosis includes, but is not limited to, recurrent mesenteric ischemia, pancreatitis, choledocholithiasis, perforated ulcer, duodenitis, colitis etc.  Patient has no associated chest symptoms no peripheral vascular or pulmonary symptoms.  No acute ACS like symptoms.  Reassuring ECG and troponin  Contacted Dr. Marea vascular surgery reviewed reviewed the patient's clinical symptoms history and previous mesenteric stenting  Patient's presentation is most consistent with acute presentation with potential threat to life or bodily function.  ----------------------------------------- 4:29 PM on 10/03/2023 ----------------------------------------- Appreciate vascular surgery consult, recommendation is for medical admission and preparation for angiography to evaluate for possible in-stent thrombosis.  Further orders as directed by vascular surgery   The patient is on the cardiac monitor to evaluate for evidence of arrhythmia and/or significant heart rate changes.  Patient relates improved pain with morphine  but still some present.  Patient understanding agreeable  plan for admission  Patient admitted to the hospitalist service under the care of Dr. Sherre.  Vascular surgery consulting and writing orders as needed.  At this point concern for possible stenosis of mesenteric vasculature causing ongoing symptoms.  Vascular surgery would like to admit and plans for angiography tomorrow  Patient agreeable with plan for admission     FINAL CLINICAL IMPRESSION(S) / ED DIAGNOSES   Final diagnoses:  Epigastric pain     Rx / DC Orders   ED Discharge Orders     None        Note:  This document was prepared using Dragon voice recognition software and may include unintentional dictation errors.   Dicky Anes, MD 10/03/23 [phone]

## 2023-10-03 NOTE — Assessment & Plan Note (Signed)
 Treat per above Aspirin 81 mg daily resumed on admission

## 2023-10-03 NOTE — Hospital Course (Addendum)
 Ms. Kobie Matkins is a 73 year old female with history of depression, hypertension, GERD, non-insulin-dependent diabetes mellitus, who presents to the emergency department for chief concerns of left-sided abdominal pain for 2 weeks.  Vitals in the ED showed temperature 98.6, respiration 18, heart rate 70, blood pressure 174/91, SpO2 98% on room air.  Serum sodium is 138, potassium 3.8, chloride 103, bicarb 22, BUN of 12, serum creatinine 1.04, EGFR 58, nonfasting blood glucose 91, WBC 8.5, hemoglobin 12.9, platelets 316.  UA was negative for leukocytes and nitrates.  ED treatment: Morphine  4 mg IV one-time dose, ondansetron  4 mg IV one-time dose.  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

## 2023-10-03 NOTE — Assessment & Plan Note (Signed)
-   Atorvastatin 10 mg daily resumed 

## 2023-10-03 NOTE — H&P (Signed)
 History and Physical   Teresa Sparks FMW:[id number] DOB: April 17, 1951 DOA: 10/03/2023  PCP: Valora Agent, MD  Outpatient Specialists: Dr. Jama Patient coming from: Home  I have personally briefly reviewed patient's old medical records in [facility] EMR.  Chief Concern: Abdominal pain  HPI: Teresa Sparks is a 73 year old female with history of depression, hypertension, GERD, non-insulin-dependent diabetes mellitus, who presents to the emergency department for chief concerns of left-sided abdominal pain for 2 weeks.  Vitals in the ED showed temperature 98.6, respiration 18, heart rate 70, blood pressure 174/91, SpO2 98% on room air.  Serum sodium is 138, potassium 3.8, chloride 103, bicarb 22, BUN of 12, serum creatinine 1.04, EGFR 58, nonfasting blood glucose 91, WBC 8.5, hemoglobin 12.9, platelets 316.  UA was negative for leukocytes and nitrates.  ED treatment: Morphine  4 mg IV one-time dose, ondansetron  4 mg IV one-time dose.  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 ----------------------------- At bedside, patient able to tell me her first, last name, age, location, current calendar year.  She reports that she has been having right upper and left upper quadrant abdominal pain for about 3 weeks.  She reports the pain is similar to her prior episode of abdominal pain when she had stent placed.  She denies trauma to her person.  She denies chest pain, shortness of breath, dysuria, hematuria, diarrhea, vomiting, blood in her stool.  She denies fever, cough, chills.  She endorses nausea.  She endorses pressure sensation when she urinates.  Social history: She lives at home.  She denies tobacco, EtOH, recreational drug use.  ROS: Constitutional: no weight change, no fever ENT/Mouth: no sore throat, no rhinorrhea Eyes: no eye pain, no vision changes Cardiovascular: no chest  pain, no dyspnea,  no edema, no palpitations Respiratory: no cough, no sputum, no wheezing Gastrointestinal: no nausea, no vomiting, no diarrhea, no constipation Genitourinary: no urinary incontinence, no dysuria, no hematuria Musculoskeletal: no arthralgias, no myalgias Skin: no skin lesions, no pruritus, Neuro: + weakness, no loss of consciousness, no syncope Psych: no anxiety, no depression, no decrease appetite Heme/Lymph: no bruising, no bleeding  ED Course: Discussed with EDP, patient requiring hospitalization for chief concerns of 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 twice daily resumed for day of admission Home lisinopril -hydrochlorothiazide  10-12.5 mg p.o. daily, metoprolol  succinate 25 mg daily resumed on 10/04/2023 Hydralazine  5 mg IV every 6 hours as needed for SBP greater 165, 5 days ordered  Celiac artery stenosis (HCC) Treat per above Aspirin  81 mg daily resumed on admission  Chart reviewed.   DVT prophylaxis: TED hose Code Status: full code Diet: Heart healthy diet; n.p.o. after midnight Family Communication: A phone call was offered, patient declined stating daughters has been  called Disposition Plan: pending clinical course Consults called: Vascular Admission status: Inpatient  telemetry cardiac  Past Medical History:  Diagnosis Date   Adenomatous colon polyp    Adrenal adenoma    Allergic genetic state    Arthritis    Chronic abdominal pain    GERD (gastroesophageal reflux disease)    Hiatal hernia    Hypertension    Sleep apnea    Past Surgical History:  Procedure Laterality Date   ABDOMINAL HYSTERECTOMY     CHOLECYSTECTOMY     COLONOSCOPY     COLONOSCOPY WITH PROPOFOL  N/A 07/24/2018   Procedure: COLONOSCOPY WITH PROPOFOL ;  Surgeon: Viktoria Lamar DASEN, MD;  Location: [facility] ENDOSCOPY;  Service: Endoscopy;  Laterality: N/A;   ENDOBRONCHIAL ULTRASOUND Bilateral 09/30/2023   Procedure: ENDOBRONCHIAL ULTRASOUND;  Surgeon: Tamea Dedra CROME, MD;  Location: [facility] ORS;  Service: Pulmonary;  Laterality: Bilateral;   FRACTURE SURGERY     JOINT REPLACEMENT Right    6 years ago   VISCERAL ANGIOGRAPHY N/A 02/05/2022   Procedure: VISCERAL ANGIOGRAPHY;  Surgeon: Jama Cordella MATSU, MD;  Location: [facility] INVASIVE CV LAB;  Service: Cardiovascular;  Laterality: N/A;   Social History:  reports that she has quit smoking. Her smoking use included cigarettes. She started smoking about 54 years ago. She has a 0.5 pack-year smoking history. Her smokeless tobacco use includes snuff. She reports that she does not drink alcohol and does not use drugs.  Allergies  Allergen Reactions   Iodinated Contrast Media Anaphylaxis    Other reaction(s): Vomiting   Ciprofloxacin Diarrhea and Nausea Only    Nausea, vomiting  Other reaction(s): Vomiting   Codeine Sulfate [Codeine] Diarrhea    Nausea, vomiting   Meloxicam    Penicillins Nausea Only   Protonix  [Pantoprazole  Sodium] Hives   Shellfish Allergy Swelling   Sulfa Antibiotics Rash   Family History  Problem Relation Age of Onset   Hypertension Mother    Stroke Mother    Epilepsy Mother    Colon polyps Sister    Breast  cancer Other    Family history: Family history reviewed and not pertinent.  Prior to Admission medications   Medication Sig Start Date End Date Taking? Authorizing Provider  acetaminophen  (TYLENOL ) 650 MG CR tablet Take 650 mg by mouth every 8 (eight) hours as needed for pain.   Yes [provider]  aspirin  81 MG chewable tablet Chew by mouth daily.   Yes [provider]  cetirizine (ZYRTEC) 10 MG tablet Take 10 mg by mouth daily.   Yes [provider]  colchicine  0.6 MG tablet Take by mouth. 10/14/21  Yes [provider]  cyclobenzaprine  (FLEXERIL ) 5 MG tablet Take 5 mg by mouth as needed. 02/24/23  Yes [provider]  DULoxetine  (CYMBALTA ) 30 MG capsule Take 30 mg by mouth daily.   Yes [provider]  ferrous sulfate  325 (65 FE) MG tablet Take 325 mg by mouth daily with breakfast.   Yes [provider]  fluticasone  (FLONASE ) 50 MCG/ACT nasal spray Place into both nostrils daily.   Yes [provider]  hydrALAZINE  (APRESOLINE ) 50 MG tablet Take 50 mg by mouth 2 (two) times daily.   Yes [provider]  lisinopril -hydrochlorothiazide  (ZESTORETIC ) 10-12.5 MG tablet Take 1 tablet by mouth daily. 10/13/21  Yes [provider]  metoprolol  succinate (TOPROL -XL) 25 MG 24 hr tablet Take 25 mg by mouth daily. 10/04/22  Yes [provider]  Multiple Vitamins-Minerals (CENTRUM ADULTS PO) Take by mouth daily.   Yes [provider]  omeprazole  (PRILOSEC) 40 MG capsule Take  40 mg by mouth 2 (two) times daily.   Yes [provider]  PARoxetine  (PAXIL ) 10 MG tablet Take 1 tablet by mouth daily at 6 (six) AM. 11/06/21  Yes [provider]  atorvastatin  (LIPITOR) 10 MG tablet Take 1 tablet (10 mg total) by mouth daily at 6 PM. 02/06/22 09/30/23  Von Bellis, MD  famotidine  (PEPCID ) 20 MG tablet Take 1 tablet (20 mg total) by mouth daily for 10 days. 06/26/21 09/30/23  Angelena Smalls, MD   gabapentin  (NEURONTIN ) 300 MG capsule Take 300 mg by mouth 2 (two) times daily. Patient not taking: Reported on 09/26/2023    [provider]  Semaglutide-Weight Management 0.25 MG/0.5ML SOAJ Inject 0.25 mg into the skin. Patient not taking: Reported on 09/30/2023    [provider]    Physical Exam: Vitals:   10/03/23 1359 10/03/23 1845 10/03/23 1926 10/03/23 2155  BP:    (!) 155/56  Pulse:  70  (!) 52  Resp:  14  12  Temp:   98.4 F (36.9 C) 98.4 F (36.9 C)  TempSrc:   Oral Oral  SpO2:  100%  99%  Weight: 96.6 kg     Height: 5' 9 (1.753 m)      Constitutional: appears age-appropriate, NAD, calm Eyes: PERRL, lids and conjunctivae normal ENMT: Mucous membranes are moist. Posterior pharynx clear of any exudate or lesions. Age-appropriate dentition. Hearing appropriate Neck: normal, supple, no masses, no thyromegaly Respiratory: clear to auscultation bilaterally, no wheezing, no crackles. Normal respiratory effort. No accessory muscle use.  Cardiovascular: Regular rate and rhythm, no murmurs / rubs / gallops. No extremity edema. 2+ pedal pulses. No carotid bruits.  Abdomen: Obese abdomen, bilateral upper abdominal quadrant tenderness, no masses palpated, no hepatosplenomegaly. Bowel sounds positive.  Musculoskeletal: no clubbing / cyanosis. No joint deformity upper and lower extremities. Good ROM, no contractures, no atrophy. Normal muscle tone.  Skin: no rashes, lesions, ulcers. No induration Neurologic: Sensation intact. Strength 5/5 in all 4.  Psychiatric: Normal judgment and insight. Alert and oriented x 3. Normal mood.   EKG: independently reviewed, showing sinus rhythm with rate of 68, QTc 448  Chest x-ray on Admission: I personally reviewed and I agree with radiologist reading as below.  CT ABDOMEN PELVIS WO CONTRAST Result Date: 10/03/2023 CLINICAL DATA:  Epigastric and left-sided abdominal pain for 2 weeks. Nausea. Diarrhea. EXAM: CT ABDOMEN AND PELVIS  WITHOUT CONTRAST TECHNIQUE: Multidetector CT imaging of the abdomen and pelvis was performed following the standard protocol without IV contrast. RADIATION DOSE REDUCTION: This exam was performed according to the departmental dose-optimization program which includes automated exposure control, adjustment of the mA and/or kV according to patient size and/or use of iterative reconstruction technique. COMPARISON:  02/04/2022 FINDINGS: Lower chest: No acute findings. Hepatobiliary: No mass visualized on this unenhanced exam. Prior cholecystectomy. No evidence of biliary obstruction. Pancreas: No mass or inflammatory process visualized on this unenhanced exam. Spleen:  Within normal limits in size. Adrenals/Urinary tract: 1.9 cm low-attenuation left adrenal mass remains stable, consistent with benign adenoma (No followup imaging is recommended) . No evidence of urolithiasis or hydronephrosis. Unremarkable unopacified urinary bladder. Stomach/Bowel: No evidence of obstruction, inflammatory process, or abnormal fluid collections. Normal appendix visualized. Vascular/Lymphatic: No pathologically enlarged lymph nodes identified. No evidence of abdominal aortic aneurysm. Reproductive: Prior hysterectomy noted. Adnexal regions are unremarkable in appearance. Other:  None. Musculoskeletal:  No suspicious bone lesions identified. IMPRESSION: No acute findings or other significant abnormality. Electronically Signed   By: Norleen LABOR  Graham M.D.   On: 10/03/2023 16:45   DG Chest 2 View Result Date: 10/03/2023 CLINICAL DATA:  Left-sided abdominal pain for 2 weeks EXAM: CHEST - 2 VIEW COMPARISON:  09/30/2023 FINDINGS: Apical lordotic positioning on the frontal. Midline trachea. Normal heart size. Atherosclerosis in the transverse aorta. No pleural effusion or pneumothorax. Clear lungs. No free intraperitoneal air. Cholecystectomy clips. IMPRESSION: No active cardiopulmonary disease. Aortic Atherosclerosis (ICD10-I70.0). Electronically  Signed   By: Rockey Kilts M.D.   On: 10/03/2023 14:55   Labs on Admission: I have personally reviewed following labs  CBC: Recent Labs  Lab 10/03/23 1400  WBC 8.5  HGB 12.9  HCT 40.5  MCV 78.2*  PLT 316   Basic Metabolic Panel: Recent Labs  Lab 10/03/23 1400  NA 138  K 3.8  CL 103  CO2 22  GLUCOSE 91  BUN 12  CREATININE 1.04*  CALCIUM  9.7   GFR: Estimated Creatinine Clearance: 62.3 mL/min (A) (by C-G formula based on SCr of 1.04 mg/dL (H)).  Liver Function Tests: Recent Labs  Lab 10/03/23 1530  AST 24  ALT 41  ALKPHOS 44  BILITOT 0.6  PROT 7.1  ALBUMIN 4.0   Recent Labs  Lab 10/03/23 1530  LIPASE 22   Coagulation Profile: Recent Labs  Lab 10/03/23 1400  INR 1.0   Urine analysis:    Component Value Date/Time   COLORURINE COLORLESS (A) 10/03/2023 1330   APPEARANCEUR CLEAR (A) 10/03/2023 1330   APPEARANCEUR Clear 05/08/2014 0316   LABSPEC 1.003 (L) 10/03/2023 1330   LABSPEC 1.010 05/08/2014 0316   PHURINE 7.0 10/03/2023 1330   GLUCOSEU NEGATIVE 10/03/2023 1330   GLUCOSEU Negative 05/08/2014 0316   HGBUR NEGATIVE 10/03/2023 1330   BILIRUBINUR NEGATIVE 10/03/2023 1330   BILIRUBINUR Negative 05/08/2014 0316   KETONESUR NEGATIVE 10/03/2023 1330   PROTEINUR NEGATIVE 10/03/2023 1330   NITRITE NEGATIVE 10/03/2023 1330   LEUKOCYTESUR NEGATIVE 10/03/2023 1330   LEUKOCYTESUR Negative 05/08/2014 0316   This document was prepared using Dragon Voice Recognition software and may include unintentional dictation errors.  Dr. Sherre [facility]  If 7PM-7AM, please contact overnight-coverage provider If 7AM-7PM, please contact day attending provider [email or web address]  10/03/2023, 11:15 PM

## 2023-10-03 NOTE — Assessment & Plan Note (Addendum)
 Uncontrolled Presumed secondary to patient missing her home antihypertensive medications Hydralazine  50 mg p.o. twice daily resumed for day of admission Home lisinopril -hydrochlorothiazide  10-12.5 mg p.o. daily, metoprolol  succinate 25 mg daily resumed on 10/04/2023 Hydralazine  5 mg IV every 6 hours as needed for SBP greater 165, 5 days ordered

## 2023-10-03 NOTE — Consult Note (Signed)
 Hospital Consult    Reason for Consult:  Abdominal Pain for two weeks.  Requesting Physician:  Dr Oneil Budge MD MRN #:  [id number]  History of Present Illness: [name] who presents to [facility] emergency department with complaints of abdominal pain for the past 2 weeks.  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  Patient reports that on September 30, 2023 she underwent pulmonary nodule biopsies in which she had stopped her aspirin  81 mg daily for 6 days prior and restarted 1 day afterwards.  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  Vascular surgery was consulted to evaluate.  Past Medical History:  Diagnosis Date   Adenomatous colon polyp    Adrenal adenoma    Allergic genetic state    Arthritis    Chronic abdominal pain    GERD (gastroesophageal reflux disease)    Hiatal hernia    Hypertension    Sleep apnea     Past Surgical History:  Procedure Laterality Date   ABDOMINAL HYSTERECTOMY     CHOLECYSTECTOMY     COLONOSCOPY     COLONOSCOPY WITH PROPOFOL  N/A 07/24/2018   Procedure: COLONOSCOPY WITH PROPOFOL ;  Surgeon: Viktoria Lamar DASEN, MD;  Location: [facility] ENDOSCOPY;  Service: Endoscopy;  Laterality: N/A;   ENDOBRONCHIAL ULTRASOUND Bilateral 09/30/2023   Procedure: ENDOBRONCHIAL ULTRASOUND;  Surgeon: Tamea Dedra CROME, MD;  Location: [facility] ORS;  Service: Pulmonary;  Laterality: Bilateral;   FRACTURE SURGERY     JOINT REPLACEMENT Right    6 years ago   VISCERAL ANGIOGRAPHY N/A 02/05/2022   Procedure: VISCERAL ANGIOGRAPHY;  Surgeon: Jama Cordella MATSU, MD;  Location: [facility] INVASIVE CV LAB;  Service: Cardiovascular;  Laterality: N/A;    Allergies  Allergen Reactions   Iodinated Contrast Media Anaphylaxis    Other reaction(s): Vomiting   Ciprofloxacin Diarrhea and Nausea Only    Nausea, vomiting  Other  reaction(s): Vomiting   Codeine Sulfate [Codeine] Diarrhea    Nausea, vomiting   Meloxicam    Penicillins Nausea Only   Protonix  [Pantoprazole  Sodium] Hives   Shellfish Allergy Swelling   Sulfa Antibiotics Rash    Prior to Admission medications   Medication Sig Start Date End Date Taking? Authorizing Provider  acetaminophen  (TYLENOL ) 650 MG CR tablet Take 650 mg by mouth every 8 (eight) hours as needed for pain.    [provider]  aspirin  81 MG chewable tablet Chew by mouth daily.    [provider]  atorvastatin  (LIPITOR) 10 MG tablet Take 1 tablet (10 mg total) by mouth daily at 6 PM. 02/06/22 09/30/23  Von Bellis, MD  cetirizine (ZYRTEC) 10 MG tablet Take 10 mg by mouth daily.    [provider]  colchicine  0.6 MG tablet Take by mouth. 10/14/21   [provider]  cyclobenzaprine  (FLEXERIL ) 5 MG tablet Take 5 mg by mouth as needed. 02/24/23   [provider]  DULoxetine  (CYMBALTA ) 30 MG capsule Take 30 mg by mouth daily.    [provider]  famotidine  (PEPCID ) 20 MG tablet Take 1 tablet (20 mg total) by mouth daily for 10 days. 06/26/21 09/30/23  Angelena Smalls, MD  ferrous sulfate  325 (65 FE) MG tablet Take 325 mg by mouth daily with breakfast.    [provider]  fluticasone  (FLONASE ) 50 MCG/ACT nasal spray Place into both nostrils daily.  [provider]  gabapentin  (NEURONTIN ) 300 MG capsule Take 300 mg by mouth 2 (two) times daily. Patient not taking: Reported on 09/26/2023    [provider]  hydrALAZINE  (APRESOLINE ) 50 MG tablet Take 50 mg by mouth 2 (two) times daily.    [provider]  lisinopril -hydrochlorothiazide  (ZESTORETIC ) 10-12.5 MG tablet Take 1 tablet by mouth daily. 10/13/21   [provider]  metoprolol  succinate (TOPROL -XL) 25 MG 24 hr tablet Take 25 mg by mouth daily. 10/04/22   [provider]  Multiple Vitamins-Minerals (CENTRUM ADULTS PO) Take by mouth daily.     [provider]  omeprazole  (PRILOSEC) 40 MG capsule Take 40 mg by mouth 2 (two) times daily.    [provider]  PARoxetine  (PAXIL ) 10 MG tablet Take 1 tablet by mouth daily at 6 (six) AM. 11/06/21   [provider]  Semaglutide-Weight Management 0.25 MG/0.5ML SOAJ Inject 0.25 mg into the skin. Patient not taking: Reported on 09/30/2023    [provider]    Social History   Socioeconomic History   Marital status: Married    Spouse name: Not on file   Number of children: Not on file   Years of education: Not on file   Highest education level: Not on file  Occupational History   Not on file  Tobacco Use   Smoking status: Former    Average packs/day: 0.3 packs/day for 2.0 years (0.5 ttl pk-yrs)    Types: Cigarettes    Start date: 8   Smokeless tobacco: Never  Vaping Use   Vaping status: Never Used  Substance and Sexual Activity   Alcohol use: No   Drug use: Never   Sexual activity: Not on file  Other Topics Concern   Not on file  Social History Narrative   Not on file   Social Drivers of Health   Financial Resource Strain: Not on file  Food Insecurity: Not on file  Transportation Needs: Not on file  Physical Activity: Not on file  Stress: Not on file  Social Connections: Not on file  Intimate Partner Violence: Not on file     Family History  Problem Relation Age of Onset   Hypertension Mother    Stroke Mother    Epilepsy Mother    Colon polyps Sister    Breast cancer Other     ROS: Otherwise negative unless mentioned in HPI  Physical Examination  Vitals:   10/03/23 1358  BP: (!) 174/91  Pulse: 70  Resp: 18  Temp: 98.6 F (37 C)  SpO2: 98%   Body mass index is 31.45 kg/m.  General:  WDWN in NAD Gait: Not observed HENT: WNL, normocephalic Pulmonary: normal non-labored breathing, without Rales, rhonchi,  wheezing Cardiac: regular, without  Murmurs, rubs or gallops; without carotid bruits Abdomen: Positive  bowel Sounds throughout,  soft, NT/ND, no masses Skin: without rashes Vascular Exam/Pulses: Palpable pulses throughout.  Extremities: without ischemic changes, without Gangrene , without cellulitis; without open wounds;  Musculoskeletal: no muscle wasting or atrophy  Neurologic: A&O X 3;  No focal weakness or paresthesias are detected; speech is fluent/normal Psychiatric:  The pt has Normal affect. Lymph:  Unremarkable  CBC    Component Value Date/Time   WBC 8.5 10/03/2023 1400   RBC 5.18 (H) 10/03/2023 1400   HGB 12.9 10/03/2023 1400   HGB 10.8 (L) 05/08/2014 0151   HCT 40.5 10/03/2023 1400   HCT 35.5 05/08/2014 0151   PLT 316 10/03/2023 1400  PLT 308 05/08/2014 0151   MCV 78.2 (L) 10/03/2023 1400   MCV 76 (L) 05/08/2014 0151   MCH 24.9 (L) 10/03/2023 1400   MCHC 31.9 10/03/2023 1400   RDW 14.9 10/03/2023 1400   RDW 16.3 (H) 05/08/2014 0151   LYMPHSABS 1.7 02/04/2022 0637   LYMPHSABS 3.4 05/08/2014 0151   MONOABS 0.5 02/04/2022 0637   MONOABS 0.5 05/08/2014 0151   EOSABS 0.1 02/04/2022 0637   EOSABS 0.1 05/08/2014 0151   BASOSABS 0.1 02/04/2022 0637   BASOSABS 0.1 05/08/2014 0151    BMET    Component Value Date/Time   NA 138 10/03/2023 1400   NA 138 05/08/2014 0151   K 3.8 10/03/2023 1400   K 3.8 05/08/2014 0151   CL 103 10/03/2023 1400   CL 106 05/08/2014 0151   CO2 22 10/03/2023 1400   CO2 23 05/08/2014 0151   GLUCOSE 91 10/03/2023 1400   GLUCOSE 97 05/08/2014 0151   BUN 12 10/03/2023 1400   BUN 16 05/08/2014 0151   CREATININE 1.04 (H) 10/03/2023 1400   CREATININE 1.25 05/08/2014 0151   CALCIUM  9.7 10/03/2023 1400   CALCIUM  8.8 05/08/2014 0151   GFRNONAA 58 (L) 10/03/2023 1400   GFRNONAA 46 (L) 05/08/2014 0151   GFRAA 56 (L) 04/23/2018 0901   GFRAA 54 (L) 05/08/2014 0151    COAGS: Lab Results  Component Value Date   INR 1.0 10/03/2023     Non-Invasive Vascular Imaging:   Aortic Ultrasound with Non contrasted CT of the abdomen ordered.    ABDOMINAL VISCERAL   Patient Name:  Teresa Sparks  Date of Exam:   09/20/2023  Medical Rec #: [id number]            Accession #:    [id number]  Date of Birth: [birth year]            Patient Gender: F  Patient Age:   28 years  Exam Location:  [facility]  Procedure:      VAS US  MESENTERIC  Referring Phys: [id number] ORVIN BRAVO BROWN    ---------------------------------------------------------------------------  -----   High Risk Factors: Hypertension, past history of smoking.   Other Factors: Post prandial pain and nausea one week.   Vascular Interventions: Celiac artery stent on 02/05/22.   Performing Technologist: Donnice Charnley RVT     Examination Guidelines: A complete evaluation includes B-mode imaging,  spectral  Doppler, color Doppler, and power Doppler as needed of all accessible  portions  of each vessel. Bilateral testing is considered an integral part of a  complete  examination. Limited examinations for reoccurring indications may be  performed  as noted.     Duplex Findings:  +----------------------+--------+--------+------+--------+  Mesenteric           PSV cm/sEDV cm/sPlaqueComments  +----------------------+--------+--------+------+--------+  Aorta Prox               86                           +----------------------+--------+--------+------+--------+  Celiac Artery Origin    224      41          stent    +----------------------+--------+--------+------+--------+  Celiac Artery Proximal  193      35                   +----------------------+--------+--------+------+--------+  SMA Origin              179  29                   +----------------------+--------+--------+------+--------+  SMA Proximal            180      32                   +----------------------+--------+--------+------+--------+  SMA Mid                 176      27                    +----------------------+--------+--------+------+--------+  CHA                    179      40                   +----------------------+--------+--------+------+--------+  Splenic                123      29                   +----------------------+--------+--------+------+--------+  IMA                     71      11                   +----------------------+--------+--------+------+--------+   Summary:  Largest Aortic Diameter: 2.2 cm    Mesenteric:  Normal Splenic artery, Hepatic artery, Inferior Mesenteric artery and  Superior  Mesenteric artery findings. 70 to 99% stenosis in the celiac artery.    *See table(s) above for measurements and observations.    Diagnosing physician: Selinda Gu MD    Electronically signed by Selinda Gu MD on 09/23/2023 at 10:52:00 AM.   Statin:  Yes.   Beta Blocker:  Yes.   Aspirin :  Yes.   ACEI:  Yes.   ARB:  No. CCB use:  No Other antiplatelets/anticoagulants:  No.    ASSESSMENT/PLAN: [name] who presents to [facility] emergency department with 2 weeks of worsening abdominal pain.  Patient has a history of celiac artery stent placement back on Feb 05, 2022 with Dr. Cordella Shawl MD.  Patient recently underwent pulmonary nodule biopsies on 09/30/2023 in which she was asked to hold her daily aspirin  for 6 days prior and restarted 1 day afterwards.  Patient also underwent vascular ultrasound in our office on 09/20/2023 which reveals celiac artery stenosis.  Therefore, vascular surgery plans on taking the patient to the vascular lab tomorrow on 10/04/2023 for visceral angiogram to reassess celiac artery stent placement.  I discussed in detail with the patient the procedure, benefits, risk, and complications.  Patient verbalized her understanding and wishes to proceed as soon as possible.  I answered all the patient's questions this afternoon.  Patient endorses that she has a reaction to contrast dye and therefore contrast dye  allergy protocol has been ordered as premedication for procedure tomorrow.  Patient was also made n.p.o. for the procedure tomorrow.   I discussed the plan in detail with Dr. Cordella Shawl MD and he agrees with the plan.   Gwendlyn JONELLE [facility] 10/03/2023 3:35 PM

## 2023-10-03 NOTE — Anesthesia Postprocedure Evaluation (Signed)
 Anesthesia Post Note  Patient: Teresa Sparks  Procedure(s) Performed: ROBOTIC ASSISTED NAVIGATIONAL BRONCHOSCOPY (Bilateral) ENDOBRONCHIAL ULTRASOUND (Bilateral)  Patient location during evaluation: PACU Anesthesia Type: General Level of consciousness: awake and alert, oriented and patient cooperative Pain management: pain level controlled Vital Signs Assessment: post-procedure vital signs reviewed and stable Respiratory status: spontaneous breathing, nonlabored ventilation and respiratory function stable Cardiovascular status: blood pressure returned to baseline and stable Postop Assessment: adequate PO intake Anesthetic complications: no   No notable events documented.   Last Vitals:  Vitals:   09/30/23 1430 09/30/23 1452  BP: (!) 150/53 (!) 149/81  Pulse: 87 89  Resp: 16 16  Temp:  36.9 C  SpO2: 100% 98%    Last Pain:  Vitals:   10/01/23 1412  TempSrc:   PainSc: 0-No pain                 Alfonso Ruths

## 2023-10-04 ENCOUNTER — Encounter
Admission: EM | Disposition: A | Discharge status: Home / Self Care | Payer: Self-pay | Source: Home / Self Care | Attending: Obstetrics and Gynecology

## 2023-10-04 ENCOUNTER — Encounter: Payer: Self-pay | Admitting: Internal Medicine

## 2023-10-04 DIAGNOSIS — R109 Unspecified abdominal pain: Secondary | ICD-10-CM | POA: Diagnosis not present

## 2023-10-04 DIAGNOSIS — R933 Abnormal findings on diagnostic imaging of other parts of digestive tract: Secondary | ICD-10-CM

## 2023-10-04 DIAGNOSIS — R195 Other fecal abnormalities: Secondary | ICD-10-CM

## 2023-10-04 DIAGNOSIS — I771 Stricture of artery: Secondary | ICD-10-CM | POA: Diagnosis not present

## 2023-10-04 DIAGNOSIS — R1013 Epigastric pain: Secondary | ICD-10-CM | POA: Diagnosis not present

## 2023-10-04 DIAGNOSIS — R1010 Upper abdominal pain, unspecified: Secondary | ICD-10-CM | POA: Diagnosis not present

## 2023-10-04 HISTORY — PX: VISCERAL ANGIOGRAPHY: CATH118276

## 2023-10-04 LAB — BASIC METABOLIC PANEL
Anion gap: 12 (ref 5–15)
BUN: 12 mg/dL (ref 8–23)
CO2: 25 mmol/L (ref 22–32)
Calcium: 9.3 mg/dL (ref 8.9–10.3)
Chloride: 101 mmol/L (ref 98–111)
Creatinine, Ser: 0.86 mg/dL (ref 0.44–1.00)
GFR, Estimated: >60 mL/min (ref >60)
Glucose, Bld: 113 mg/dL — ABNORMAL HIGH (ref 70–99)
Potassium: 3.9 mmol/L (ref 3.5–5.1)
Sodium: 138 mmol/L (ref 135–145)

## 2023-10-04 LAB — CBC
HCT: 37.6 % (ref 36.0–46.0)
Hemoglobin: 11.7 g/dL — ABNORMAL LOW (ref 12.0–15.0)
MCH: 24.6 pg — ABNORMAL LOW (ref 26.0–34.0)
MCHC: 31.1 g/dL (ref 30.0–36.0)
MCV: 79 fL — ABNORMAL LOW (ref 80.0–100.0)
Platelets: 303 10*3/uL (ref 150–400)
RBC: 4.76 MIL/uL (ref 3.87–5.11)
RDW: 14.8 % (ref 11.5–15.5)
WBC: 8.3 10*3/uL (ref 4.0–10.5)
nRBC: 0 % (ref 0.0–0.2)

## 2023-10-04 SURGERY — VISCERAL ANGIOGRAPHY
Anesthesia: Moderate Sedation

## 2023-10-04 MED ORDER — SODIUM CHLORIDE 0.9 % IV SOLN: INTRAVENOUS | Status: DC

## 2023-10-04 MED ORDER — IODIXANOL 320 MG/ML IV SOLN
INTRAVENOUS | Status: DC | PRN
  Administered 2023-10-04: 25 mL

## 2023-10-04 MED ORDER — DEXTROSE-SODIUM CHLORIDE 5-0.45 % IV SOLN: INTRAVENOUS | Status: AC

## 2023-10-04 MED ORDER — FENTANYL CITRATE PF 50 MCG/ML IJ SOSY: 12.5000 ug | PREFILLED_SYRINGE | Freq: Once | INTRAMUSCULAR | Status: DC | PRN

## 2023-10-04 MED ORDER — CEFAZOLIN SODIUM-DEXTROSE 2-4 GM/100ML-% IV SOLN
INTRAVENOUS | Status: AC
  Filled 2023-10-04: qty 100

## 2023-10-04 MED ORDER — CEFAZOLIN SODIUM-DEXTROSE 2-4 GM/100ML-% IV SOLN
2.0000 g | INTRAVENOUS | Status: AC
Start: 2023-10-04 — End: 2023-10-04
  Administered 2023-10-04: 2 g via INTRAVENOUS

## 2023-10-04 MED ORDER — SODIUM CHLORIDE 0.9 % IV SOLN: 250.0000 mL | INTRAVENOUS | Status: DC | PRN

## 2023-10-04 MED ORDER — HEPARIN SODIUM (PORCINE) 1000 UNIT/ML IJ SOLN
INTRAMUSCULAR | Status: AC
  Filled 2023-10-04: qty 10

## 2023-10-04 MED ORDER — FENTANYL CITRATE (PF) 100 MCG/2ML IJ SOLN
INTRAMUSCULAR | Status: AC
  Filled 2023-10-04: qty 2

## 2023-10-04 MED ORDER — MIDAZOLAM HCL 5 MG/5ML IJ SOLN
INTRAMUSCULAR | Status: AC
  Filled 2023-10-04: qty 5

## 2023-10-04 MED ORDER — HEPARIN (PORCINE) IN NACL 1000-0.9 UT/500ML-% IV SOLN
INTRAVENOUS | Status: DC | PRN
  Administered 2023-10-04: 500 mL

## 2023-10-04 MED ORDER — SODIUM CHLORIDE 0.9% FLUSH: 3.0000 mL | INTRAVENOUS | Status: DC | PRN

## 2023-10-04 MED ORDER — FAMOTIDINE 20 MG PO TABS
ORAL_TABLET | ORAL | Status: AC
  Filled 2023-10-04: qty 2

## 2023-10-04 MED ORDER — LIDOCAINE HCL (PF) 1 % IJ SOLN
INTRAMUSCULAR | Status: DC | PRN
  Administered 2023-10-04: 10 mL

## 2023-10-04 MED ORDER — FENTANYL CITRATE (PF) 100 MCG/2ML IJ SOLN
INTRAMUSCULAR | Status: DC | PRN
  Administered 2023-10-04: 50 ug via INTRAVENOUS

## 2023-10-04 MED ORDER — DULOXETINE HCL 30 MG PO CPEP
30.0000 mg | ORAL_CAPSULE | Freq: Every day | ORAL | Status: DC
  Administered 2023-10-04 – 2023-10-05 (×2): 30 mg via ORAL
  Filled 2023-10-04 (×2): qty 1

## 2023-10-04 MED ORDER — FAMOTIDINE 20 MG PO TABS
40.0000 mg | ORAL_TABLET | Freq: Once | ORAL | Status: AC | PRN
  Administered 2023-10-04: 40 mg via ORAL

## 2023-10-04 MED ORDER — PAROXETINE HCL 10 MG PO TABS
10.0000 mg | ORAL_TABLET | Freq: Every day | ORAL | Status: DC
  Administered 2023-10-04 – 2023-10-05 (×2): 10 mg via ORAL
  Filled 2023-10-04 (×2): qty 1

## 2023-10-04 MED ORDER — MIDAZOLAM HCL 2 MG/ML PO SYRP: 8.0000 mg | ORAL_SOLUTION | Freq: Once | ORAL | Status: DC | PRN

## 2023-10-04 MED ORDER — MIDAZOLAM HCL 2 MG/2ML IJ SOLN
INTRAMUSCULAR | Status: DC | PRN
  Administered 2023-10-04: 2 mg via INTRAVENOUS

## 2023-10-04 MED ORDER — SODIUM CHLORIDE 0.9% FLUSH
3.0000 mL | Freq: Two times a day (BID) | INTRAVENOUS | Status: DC
  Administered 2023-10-04 – 2023-10-05 (×2): 3 mL via INTRAVENOUS

## 2023-10-04 MED ORDER — SODIUM CHLORIDE 0.9 % IV SOLN
INTRAVENOUS | Status: AC
Start: 2023-10-04 — End: 2023-10-04

## 2023-10-04 MED ORDER — ONDANSETRON HCL 4 MG/2ML IJ SOLN
INTRAMUSCULAR | Status: AC
  Filled 2023-10-04: qty 2

## 2023-10-04 MED ORDER — METHYLPREDNISOLONE SODIUM SUCC 125 MG IJ SOLR: 125.0000 mg | Freq: Once | INTRAMUSCULAR | Status: DC | PRN

## 2023-10-04 MED ORDER — HYDROMORPHONE HCL 1 MG/ML IJ SOLN
1.0000 mg | Freq: Once | INTRAMUSCULAR | Status: AC
  Administered 2023-10-04: 1 mg via INTRAVENOUS
  Filled 2023-10-04: qty 1

## 2023-10-04 SURGICAL SUPPLY — 14 items
CATH ANGIO 5F PIGTAIL 65CM (CATHETERS) IMPLANT
COVER PROBE ULTRASOUND 5X96 (MISCELLANEOUS) IMPLANT
DEVICE STARCLOSE SE CLOSURE (Vascular Products) IMPLANT
DRAPE BRACHIAL (DRAPES) IMPLANT
NDL ENTRY 21GA 7CM ECHOTIP (NEEDLE) IMPLANT
NEEDLE ENTRY 21GA 7CM ECHOTIP (NEEDLE) ×1 IMPLANT
PACK ANGIOGRAPHY (CUSTOM PROCEDURE TRAY) ×1 IMPLANT
PANNUS RETENTION SYSTEM 2 PAD (MISCELLANEOUS) IMPLANT
SET INTRO CAPELLA COAXIAL (SET/KITS/TRAYS/PACK) IMPLANT
SHEATH BRITE TIP 5FRX11 (SHEATH) IMPLANT
SHEATH BRITE TIP 6FRX5.5 (SHEATH) IMPLANT
SYR MEDRAD MARK 7 150ML (SYRINGE) IMPLANT
TUBING CONTRAST HIGH PRESS 72 (TUBING) IMPLANT
WIRE GUIDERIGHT .035X150 (WIRE) IMPLANT

## 2023-10-04 NOTE — Op Note (Signed)
 [facility]  Percutaneous Study/Intervention Procedural Note   Date of Surgery:10/04/2023 Surgeon:Seven Marengo  Pre-operative Diagnosis:  abdominal pain  Post-operative diagnosis:  Same  Procedure(s) Performed:  1.  Abdominal aortogram AP and lateral views  2.  StarClose closure right common femoral artery    Anesthesia: Conscious sedation was administered under my direct supervision by the interventional radiology RN. IV Versed  plus fentanyl  were utilized. Continuous ECG, pulse oximetry and blood pressure was monitored throughout the entire procedure.  Conscious sedation was for a total of 25 minutes and 32 seconds.  Sheath: 5 French  Contrast: 25 cc  Fluoroscopy Time: 0.6 minutes  Indications:  Lafrances CHRISTELLA Browner presented with abdominal pain and bloody diarrhea. CT scan demonstrated diffuse colitis. The patient is ruled out for infectious etiologies and therefore the GI service is concerned that this represents ischemic colitis and has asked us  to evaluate the patient for mesenteric ischemia. The rest and benefits of angiography with possible intervention have been reviewed, all questions were answered and the patient has agreed to proceed with angiography and possible intervention.  Procedure:  ELLENIE SALOME is a 73 y.o. y.o. female who was identified and appropriate procedural time out was performed.  The patient was then placed supine on the table and prepped and draped in the usual sterile fashion.    Ultrasound was used to evaluate the right common femoral artery.  It was pulsatile and echolucent indicating it was patent .  A micropuncture needle was used to access the right common femoral artery under direct ultrasound guidance and an image was recorded for the permanent record.  A 0.035 J wire was advanced without resistance and a 5Fr sheath was placed.    Pigtail catheter was positioned to the level of T10 and AP view of the aorta was  obtained. This localized the SMA and celiac artery origins. The pigtail catheter was repositioned the detector was then maneuvered into a lateral position.  Bolus injection contrast was utilized to demonstrate the origins of the celiac and the SMA.   After review these images the catheters removed over wire oblique view of the right groin is obtained and a minx device deployed without difficulty there are no immediate complications.  Findings:   Aortogram:  The abdominal aorta is opacified with a bolus injection contrast. The origin of the celiac appears to be at the level of T11 with the SMA just below. In the lateral projection there is no evidence of plaque formation or stenosis of the origins of the SMA and the celiac.  Common femoral is widely patent and suitable for closure with a Starclose.  Summary: Widely patent arterial system with little evidence for atherosclerotic changes there is no hemodynamically significant lesions noted throughout the visceral arteries.    Disposition: Patient was taken to the recovery room in stable condition having tolerated the procedure well.  Cordella Dayannara Pascal 10/04/2023,1:02 PM

## 2023-10-04 NOTE — Plan of Care (Signed)

## 2023-10-04 NOTE — Interval H&P Note (Signed)
 History and Physical Interval Note:  10/04/2023 1:01 PM  Teresa Sparks  has presented today for surgery, with the diagnosis of Celiac Artery Stenosis.  The various methods of treatment have been discussed with the patient and family. After consideration of risks, benefits and other options for treatment, the patient has consented to  Procedure(s): VISCERAL ANGIOGRAPHY (N/A) as a surgical intervention.  The patient's history has been reviewed, patient examined, no change in status, stable for surgery.  I have reviewed the patient's chart and labs.  Questions were answered to the patient's satisfaction.     Cordella Shawl

## 2023-10-04 NOTE — Progress Notes (Signed)
 PROGRESS NOTE    Teresa Sparks  FMW:[id number] DOB: [birth year] DOA: 10/03/2023 PCP: Valora Agent, MD  Outpatient Specialists: vascular    Brief Narrative:   From admission h and p  Ms. Teresa Sparks is a 73 year old female with history of depression, hypertension, GERD, non-insulin-dependent diabetes mellitus, who presents to the emergency department for chief concerns of left-sided abdominal pain for 2 weeks.  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  She reports that she has been having right upper and left upper quadrant abdominal pain for about 3 weeks.  She reports the pain is similar to her prior episode of abdominal pain when she had stent placed.  She denies trauma to her person.  She denies chest pain, shortness of breath, dysuria, hematuria, diarrhea, vomiting, blood in her stool.  She denies fever, cough, chills.  Assessment & Plan:   Principal Problem:   Abdominal pain Active Problems:   Celiac artery stenosis (HCC)   Essential hypertension   Hyperlipidemia   GERD (gastroesophageal reflux disease)  # Abdominal pain History mesenteric ischemia, had celiac artery stent placed last year, now with 2 weeks epigastric pain worse about 30 minutes after eating, u/s in vascular office on 12/24 showed celiac artery stenosis. This in setting of recent bronch with holding aspirin  for a week. Labs unremarkable, non-con CT of abdomen/pelvis nothing acute. No vomiting or diarrhea or constipation. - angiogram scheduled for later today - continue asa, statin  # Hypertension Here bp elevated - cont home hydral, metop, lisinopril /hctz  # Chronic pain, GAD - home duloxetine , paxil    DVT prophylaxis: SCDs Code Status: full Family Communication: daughter bettie updated 1/7  Level of care: Telemetry Cardiac Status is: Inpatient Remains inpatient appropriate because: severity of  illness    Consultants:  vascular  Procedures: pending  Antimicrobials:  none    Subjective: Mild epigastric pain  Objective: Vitals:   10/04/23 0245 10/04/23 0700 10/04/23 0705 10/04/23 0705  BP: (!) 163/67  (!) 160/65   Pulse: 75 68 67   Resp: 13 20 14    Temp:    98.3 F (36.8 C)  TempSrc:    Oral  SpO2: 99% 99% 97%   Weight:      Height:       No intake or output data in the 24 hours ending 10/04/23 9177 Filed Weights   10/03/23 1359  Weight: 96.6 kg    Examination:  General exam: Appears calm and comfortable  Respiratory system: Clear to auscultation. Respiratory effort normal. Cardiovascular system: S1 & S2 heard, RRR. No JVD, murmurs, rubs, gallops or clicks. No pedal edema. Gastrointestinal system: mild ttp upper quadrants, no rebound Central nervous system: Alert and oriented. No focal neurological deficits. Extremities: Symmetric 5 x 5 power. Skin: No rashes, lesions or ulcers Psychiatry: Judgement and insight appear normal. Mood & affect appropriate.     Data Reviewed: I have personally reviewed following labs and imaging studies  CBC: Recent Labs  Lab 10/03/23 1400 10/04/23 0704  WBC 8.5 8.3  HGB 12.9 11.7*  HCT 40.5 37.6  MCV 78.2* 79.0*  PLT 316 303   Basic Metabolic Panel: Recent Labs  Lab 10/03/23 1400 10/04/23 0704  NA 138 138  K 3.8 3.9  CL 103 101  CO2 22 25  GLUCOSE 91 113*  BUN 12 12  CREATININE 1.04* 0.86  CALCIUM  9.7 9.3   GFR: Estimated Creatinine Clearance: 75.3 mL/min (by C-G formula based on SCr  of 0.86 mg/dL). Liver Function Tests: Recent Labs  Lab 10/03/23 1530  AST 24  ALT 41  ALKPHOS 44  BILITOT 0.6  PROT 7.1  ALBUMIN 4.0   Recent Labs  Lab 10/03/23 1530  LIPASE 22   No results for input(s): AMMONIA in the last 168 hours. Coagulation Profile: Recent Labs  Lab 10/03/23 1400  INR 1.0   Cardiac Enzymes: No results for input(s): CKTOTAL, CKMB, CKMBINDEX, TROPONINI in the last 168  hours. BNP (last 3 results) No results for input(s): PROBNP in the last 8760 hours. HbA1C: No results for input(s): HGBA1C in the last 72 hours. CBG: No results for input(s): GLUCAP in the last 168 hours. Lipid Profile: No results for input(s): CHOL, HDL, LDLCALC, TRIG, CHOLHDL, LDLDIRECT in the last 72 hours. Thyroid Function Tests: No results for input(s): TSH, T4TOTAL, FREET4, T3FREE, THYROIDAB in the last 72 hours. Anemia Panel: No results for input(s): VITAMINB12, FOLATE, FERRITIN, TIBC, IRON, RETICCTPCT in the last 72 hours. Urine analysis:    Component Value Date/Time   COLORURINE COLORLESS (A) 10/03/2023 1330   APPEARANCEUR CLEAR (A) 10/03/2023 1330   APPEARANCEUR Clear 05/08/2014 0316   LABSPEC 1.003 (L) 10/03/2023 1330   LABSPEC 1.010 05/08/2014 0316   PHURINE 7.0 10/03/2023 1330   GLUCOSEU NEGATIVE 10/03/2023 1330   GLUCOSEU Negative 05/08/2014 0316   HGBUR NEGATIVE 10/03/2023 1330   BILIRUBINUR NEGATIVE 10/03/2023 1330   BILIRUBINUR Negative 05/08/2014 0316   KETONESUR NEGATIVE 10/03/2023 1330   PROTEINUR NEGATIVE 10/03/2023 1330   NITRITE NEGATIVE 10/03/2023 1330   LEUKOCYTESUR NEGATIVE 10/03/2023 1330   LEUKOCYTESUR Negative 05/08/2014 0316   Sepsis Labs: @LABRCNTIP (procalcitonin:4,lacticidven:4)  )No results found for this or any previous visit (from the past 240 hours).       Radiology Studies: CT ABDOMEN PELVIS WO CONTRAST Result Date: 10/03/2023 CLINICAL DATA:  Epigastric and left-sided abdominal pain for 2 weeks. Nausea. Diarrhea. EXAM: CT ABDOMEN AND PELVIS WITHOUT CONTRAST TECHNIQUE: Multidetector CT imaging of the abdomen and pelvis was performed following the standard protocol without IV contrast. RADIATION DOSE REDUCTION: This exam was performed according to the departmental dose-optimization program which includes automated exposure control, adjustment of the mA and/or kV according to patient size and/or use  of iterative reconstruction technique. COMPARISON:  02/04/2022 FINDINGS: Lower chest: No acute findings. Hepatobiliary: No mass visualized on this unenhanced exam. Prior cholecystectomy. No evidence of biliary obstruction. Pancreas: No mass or inflammatory process visualized on this unenhanced exam. Spleen:  Within normal limits in size. Adrenals/Urinary tract: 1.9 cm low-attenuation left adrenal mass remains stable, consistent with benign adenoma (No followup imaging is recommended) . No evidence of urolithiasis or hydronephrosis. Unremarkable unopacified urinary bladder. Stomach/Bowel: No evidence of obstruction, inflammatory process, or abnormal fluid collections. Normal appendix visualized. Vascular/Lymphatic: No pathologically enlarged lymph nodes identified. No evidence of abdominal aortic aneurysm. Reproductive: Prior hysterectomy noted. Adnexal regions are unremarkable in appearance. Other:  None. Musculoskeletal:  No suspicious bone lesions identified. IMPRESSION: No acute findings or other significant abnormality. Electronically Signed   By: Norleen DELENA Kil M.D.   On: 10/03/2023 16:45   DG Chest 2 View Result Date: 10/03/2023 CLINICAL DATA:  Left-sided abdominal pain for 2 weeks EXAM: CHEST - 2 VIEW COMPARISON:  09/30/2023 FINDINGS: Apical lordotic positioning on the frontal. Midline trachea. Normal heart size. Atherosclerosis in the transverse aorta. No pleural effusion or pneumothorax. Clear lungs. No free intraperitoneal air. Cholecystectomy clips. IMPRESSION: No active cardiopulmonary disease. Aortic Atherosclerosis (ICD10-I70.0). Electronically Signed   By: Rockey Marthann HERO.D.  On: 10/03/2023 14:55        Scheduled Meds:  aspirin   81 mg Oral Daily   atorvastatin   10 mg Oral q1800   diphenhydrAMINE   50 mg Oral Once   Or   diphenhydrAMINE   50 mg Intravenous Once   ferrous sulfate   325 mg Oral Q breakfast   hydrALAZINE   50 mg Oral BID   lisinopril   10 mg Oral Daily   And    hydrochlorothiazide   12.5 mg Oral Daily   metoprolol  succinate  25 mg Oral Daily   multivitamin with minerals  1 tablet Oral Daily   pantoprazole   80 mg Oral BID   predniSONE   50 mg Oral Q6H   Continuous Infusions:   LOS: 1 day     Devaughn KATHEE Ban, MD [facility]   If 7PM-7AM, please contact night-coverage [email or web address] Password [email or web address] 10/04/2023, 8:22 AM    3

## 2023-10-05 DIAGNOSIS — R1013 Epigastric pain: Secondary | ICD-10-CM | POA: Diagnosis not present

## 2023-10-05 DIAGNOSIS — I771 Stricture of artery: Secondary | ICD-10-CM | POA: Diagnosis not present

## 2023-10-05 DIAGNOSIS — R1010 Upper abdominal pain, unspecified: Secondary | ICD-10-CM | POA: Diagnosis not present

## 2023-10-05 LAB — CBC
HCT: 35.2 % — ABNORMAL LOW (ref 36.0–46.0)
Hemoglobin: 11.1 g/dL — ABNORMAL LOW (ref 12.0–15.0)
MCH: 24.5 pg — ABNORMAL LOW (ref 26.0–34.0)
MCHC: 31.5 g/dL (ref 30.0–36.0)
MCV: 77.7 fL — ABNORMAL LOW (ref 80.0–100.0)
Platelets: 323 10*3/uL (ref 150–400)
RBC: 4.53 MIL/uL (ref 3.87–5.11)
RDW: 14.8 % (ref 11.5–15.5)
WBC: 11.8 10*3/uL — ABNORMAL HIGH (ref 4.0–10.5)
nRBC: 0 % (ref 0.0–0.2)

## 2023-10-05 LAB — BASIC METABOLIC PANEL
Anion gap: 10 (ref 5–15)
BUN: 11 mg/dL (ref 8–23)
CO2: 25 mmol/L (ref 22–32)
Calcium: 9.3 mg/dL (ref 8.9–10.3)
Chloride: 100 mmol/L (ref 98–111)
Creatinine, Ser: 0.89 mg/dL (ref 0.44–1.00)
GFR, Estimated: >60 mL/min (ref >60)
Glucose, Bld: 128 mg/dL — ABNORMAL HIGH (ref 70–99)
Potassium: 3.7 mmol/L (ref 3.5–5.1)
Sodium: 135 mmol/L (ref 135–145)

## 2023-10-05 NOTE — Progress Notes (Signed)
 Transition of Care [facility]) - Inpatient Brief Assessment   Patient Details  Name: Teresa Sparks MRN: [id number] Date of Birth: Sep 26, 1951  [facility] CM/SW Contact:    Makailyn Mccormick C Jamarkis Branam, RN Phone Number: 10/05/2023, 10:28 AM   Clinical Narrative: TOC continuing to follow patient's progress throughout discharge planning.   Transition of Care Asessment: Insurance and Status: Insurance coverage has been reviewed Patient has primary care physician: Yes   Prior level of function:: Independent Prior/Current Home Services: No current home services Social Drivers of Health Review: SDOH reviewed no interventions necessary Readmission risk has been reviewed: Yes Transition of care needs: no transition of care needs at this time

## 2023-10-05 NOTE — Progress Notes (Signed)
  Progress Note    10/05/2023 9:59 AM 1 Day Post-Op  Subjective:  Teresa Sparks is a 73 yo female now POD #1 from an abdominal aortogram to evaluate for mesenteric ischemia. Patient endorses feeling very well this morning. She has eaten breakfast without any nausea, vomiting or abdominal pain. No complaints overnight and vitals all remain stable.    Vitals:   10/05/23 0730 10/05/23 0731  BP:  (!) 109/55  Pulse:  62  Resp: 14 20  Temp:  99.3 F (37.4 C)  SpO2:  99%   Physical Exam: Cardiac:  RRR, No rubs, clicks or gallup's. No murmurs Lungs:  normal non-labored breathing, without Rales, rhonchi, wheezing  Incisions:  right groin puncture site with dressing clean dry and intact. No hematoma or seroma to note.  Extremities:  Bilateral lower extremities with palpable pulses Abdomen:  Positive bowel sounds throughout, soft, non tender and non distended  Neurologic: AAOX3, answers all questions and follows commands appropriately  CBC    Component Value Date/Time   WBC 11.8 (H) 10/05/2023 0442   RBC 4.53 10/05/2023 0442   HGB 11.1 (L) 10/05/2023 0442   HGB 10.8 (L) 05/08/2014 0151   HCT 35.2 (L) 10/05/2023 0442   HCT 35.5 05/08/2014 0151   PLT 323 10/05/2023 0442   PLT 308 05/08/2014 0151   MCV 77.7 (L) 10/05/2023 0442   MCV 76 (L) 05/08/2014 0151   MCH 24.5 (L) 10/05/2023 0442   MCHC 31.5 10/05/2023 0442   RDW 14.8 10/05/2023 0442   RDW 16.3 (H) 05/08/2014 0151   LYMPHSABS 1.7 02/04/2022 0637   LYMPHSABS 3.4 05/08/2014 0151   MONOABS 0.5 02/04/2022 0637   MONOABS 0.5 05/08/2014 0151   EOSABS 0.1 02/04/2022 0637   EOSABS 0.1 05/08/2014 0151   BASOSABS 0.1 02/04/2022 0637   BASOSABS 0.1 05/08/2014 0151    BMET    Component Value Date/Time   NA 135 10/05/2023 0442   NA 138 05/08/2014 0151   K 3.7 10/05/2023 0442   K 3.8 05/08/2014 0151   CL 100 10/05/2023 0442   CL 106 05/08/2014 0151   CO2 25 10/05/2023 0442   CO2 23 05/08/2014 0151   GLUCOSE 128 (H)  10/05/2023 0442   GLUCOSE 97 05/08/2014 0151   BUN 11 10/05/2023 0442   BUN 16 05/08/2014 0151   CREATININE 0.89 10/05/2023 0442   CREATININE 1.25 05/08/2014 0151   CALCIUM  9.3 10/05/2023 0442   CALCIUM  8.8 05/08/2014 0151   GFRNONAA >60 10/05/2023 0442   GFRNONAA 46 (L) 05/08/2014 0151   GFRAA 56 (L) 04/23/2018 0901   GFRAA 54 (L) 05/08/2014 0151    INR    Component Value Date/Time   INR 1.0 10/03/2023 1400     Intake/Output Summary (Last 24 hours) at 10/05/2023 0959 Last data filed at 10/05/2023 0737 Gross per 24 hour  Intake 1718.22 ml  Output --  Net 1718.22 ml     Assessment/Plan:  73 y.o. female is s/p Aortogram of the abdomen.  1 Day Post-Op   PLAN Widely patent arterial system with little evidence for atherosclerotic changes there is no hemodynamically significant lesions noted throughout the visceral arteries.  Vascular Surgery okay with patient discharge to home. Patient will need to remain on ASA 81 mg daily.  Follow up with Vascular Surgery as scheduled.   DVT prophylaxis:  ASA 81 mg daily   Teresa Sparks [facility] 10/05/2023 9:59 AM

## 2023-10-05 NOTE — Plan of Care (Signed)

## 2023-10-05 NOTE — Discharge Summary (Signed)
 Teresa Sparks FMW:[id number] DOB: 08-19-1951 DOA: 10/03/2023  PCP: Valora Agent, MD  Admit date: 10/03/2023 Discharge date: 10/05/2023  Time spent: 35 minutes  Recommendations for Outpatient Follow-up:  Pcp and GI f/u     Discharge Diagnoses:  Principal Problem:   Abdominal pain Active Problems:   Celiac artery stenosis [facility])   Essential hypertension   Hyperlipidemia   GERD (gastroesophageal reflux disease)   Discharge Condition: stable  Diet recommendation: heart healthy  Filed Weights   10/03/23 1359 10/04/23 1105 10/04/23 1149  Weight: 96.6 kg 102.3 kg 102.3 kg    History of present illness:  From admission h and p Ms. Teresa Sparks is a 73 year old female with history of depression, hypertension, GERD, non-insulin-dependent diabetes mellitus, who presents to the emergency department for chief concerns of left-sided abdominal pain for 2 weeks.   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   She reports that she has been having right upper and left upper quadrant abdominal pain for about 3 weeks.  She reports the pain is similar to her prior episode of abdominal pain when she had stent placed.  She denies trauma to her person.  She denies chest pain, shortness of breath, dysuria, hematuria, diarrhea, vomiting, blood in her stool.  She denies fever, cough, chills.  Hospital Course:  Patient presents with several weeks intermittent epigastric pain worse with eating. U/s in vascular office on 12/24 showed possible celiac artery stenosis. She has a celiac artery stent. Labs here and CT of abdomen/pelvis un-revealing. Taken for angiography on 1/7 which revealed patent vasculature. Currently no abdominal pain, tolerating diet. Patient does report chronic constipation. She also reports she is seeing GI in one week for a pre-colonoscopy appointment. I advised starting miralax  to treat  chronic constipation, and to f/u with GI on 1/14. They may consider performing an upper endoscopy as well (did have upper endoscopy 2 years ago that apparently showed mild gastritis).  Procedures: angiogram   Consultations: Vascular surgery  Discharge Exam: Vitals:   10/05/23 1100 10/05/23 1218  BP:  (!) 103/51  Pulse:  (!) 53  Resp: 16 14  Temp:  97.9 F (36.6 C)  SpO2:  100%    General: NAD Cardiovascular: RRR Respiratory: CTAB Abdomen: soft, non-tender  Discharge Instructions   Discharge Instructions     Diet - low sodium heart healthy   Complete by: As directed    Increase activity slowly   Complete by: As directed       Allergies as of 10/05/2023       Reactions   Iodinated Contrast Media Anaphylaxis   Other reaction(s): Vomiting   Ciprofloxacin Diarrhea, Nausea Only   Nausea, vomiting  Other reaction(s): Vomiting   Codeine Sulfate [codeine] Diarrhea   Nausea, vomiting   Meloxicam    Penicillins Nausea Only   Protonix  [pantoprazole  Sodium] Hives   Shellfish Allergy Swelling   Sulfa Antibiotics Rash        Medication List     STOP taking these medications    gabapentin  300 MG capsule Commonly known as: NEURONTIN    Semaglutide-Weight Management 0.25 MG/0.5ML Soaj       TAKE these medications    acetaminophen  650 MG CR tablet Commonly known as: TYLENOL  Take 650 mg by mouth every 8 (eight) hours as needed for pain.   aspirin  81 MG chewable tablet Chew by mouth daily.   atorvastatin  10 MG tablet Commonly known as: LIPITOR Take 1 tablet (  10 mg total) by mouth daily at 6 PM.   CENTRUM ADULTS PO Take by mouth daily.   cetirizine 10 MG tablet Commonly known as: ZYRTEC Take 10 mg by mouth daily.   colchicine  0.6 MG tablet Take by mouth.   cyclobenzaprine  5 MG tablet Commonly known as: FLEXERIL  Take 5 mg by mouth as needed.   DULoxetine  30 MG capsule Commonly known as: CYMBALTA  Take 30 mg by mouth daily.   famotidine  20 MG  tablet Commonly known as: PEPCID  Take 1 tablet (20 mg total) by mouth daily for 10 days.   ferrous sulfate  325 (65 FE) MG tablet Take 325 mg by mouth daily with breakfast.   fluticasone  50 MCG/ACT nasal spray Commonly known as: FLONASE  Place into both nostrils daily.   hydrALAZINE  50 MG tablet Commonly known as: APRESOLINE  Take 50 mg by mouth 2 (two) times daily.   lisinopril -hydrochlorothiazide  10-12.5 MG tablet Commonly known as: ZESTORETIC  Take 1 tablet by mouth daily.   metoprolol  succinate 25 MG 24 hr tablet Commonly known as: TOPROL -XL Take 25 mg by mouth daily.   omeprazole  40 MG capsule Commonly known as: PRILOSEC Take 40 mg by mouth 2 (two) times daily.   PARoxetine  10 MG tablet Commonly known as: PAXIL  Take 1 tablet by mouth daily at 6 (six) AM.       Allergies  Allergen Reactions   Iodinated Contrast Media Anaphylaxis    Other reaction(s): Vomiting   Ciprofloxacin Diarrhea and Nausea Only    Nausea, vomiting  Other reaction(s): Vomiting   Codeine Sulfate [Codeine] Diarrhea    Nausea, vomiting   Meloxicam    Penicillins Nausea Only   Protonix  [Pantoprazole  Sodium] Hives   Shellfish Allergy Swelling   Sulfa Antibiotics Rash    Follow-up Information     Valora Agent, MD Follow up.   Specialty: Family Medicine Contact information: [address] [facility] [place] [address] [phone]         Mevelyn Arabia B, PA-C Follow up.   Specialty: Gastroenterology Contact information: [address] [place] [address] [phone]                  The results of significant diagnostics from this hospitalization (including imaging, microbiology, ancillary and laboratory) are listed below for reference.    Significant Diagnostic Studies: PERIPHERAL VASCULAR CATHETERIZATION Result Date: 10/04/2023 See surgical note for result.  CT ABDOMEN PELVIS WO CONTRAST Result Date: 10/03/2023 CLINICAL  DATA:  Epigastric and left-sided abdominal pain for 2 weeks. Nausea. Diarrhea. EXAM: CT ABDOMEN AND PELVIS WITHOUT CONTRAST TECHNIQUE: Multidetector CT imaging of the abdomen and pelvis was performed following the standard protocol without IV contrast. RADIATION DOSE REDUCTION: This exam was performed according to the departmental dose-optimization program which includes automated exposure control, adjustment of the mA and/or kV according to patient size and/or use of iterative reconstruction technique. COMPARISON:  02/04/2022 FINDINGS: Lower chest: No acute findings. Hepatobiliary: No mass visualized on this unenhanced exam. Prior cholecystectomy. No evidence of biliary obstruction. Pancreas: No mass or inflammatory process visualized on this unenhanced exam. Spleen:  Within normal limits in size. Adrenals/Urinary tract: 1.9 cm low-attenuation left adrenal mass remains stable, consistent with benign adenoma (No followup imaging is recommended) . No evidence of urolithiasis or hydronephrosis. Unremarkable unopacified urinary bladder. Stomach/Bowel: No evidence of obstruction, inflammatory process, or abnormal fluid collections. Normal appendix visualized. Vascular/Lymphatic: No pathologically enlarged lymph nodes identified. No evidence of abdominal aortic aneurysm. Reproductive: Prior hysterectomy noted. Adnexal  regions are unremarkable in appearance. Other:  None. Musculoskeletal:  No suspicious bone lesions identified. IMPRESSION: No acute findings or other significant abnormality. Electronically Signed   By: Norleen DELENA Kil M.D.   On: 10/03/2023 16:45   DG Chest 2 View Result Date: 10/03/2023 CLINICAL DATA:  Left-sided abdominal pain for 2 weeks EXAM: CHEST - 2 VIEW COMPARISON:  09/30/2023 FINDINGS: Apical lordotic positioning on the frontal. Midline trachea. Normal heart size. Atherosclerosis in the transverse aorta. No pleural effusion or pneumothorax. Clear lungs. No free intraperitoneal air. Cholecystectomy  clips. IMPRESSION: No active cardiopulmonary disease. Aortic Atherosclerosis (ICD10-I70.0). Electronically Signed   By: Rockey Kilts M.D.   On: 10/03/2023 14:55   DG Chest Port 1 View Result Date: 09/30/2023 CLINICAL DATA:  Status post bronchoscopy EXAM: PORTABLE CHEST 1 VIEW COMPARISON:  CT 07/22/2023, 09/29/2023 FINDINGS: No acute airspace disease or pleural effusion. CT demonstrated pulmonary nodules are not well seen radiographically. Normal cardiac size with aortic atherosclerosis. No pneumothorax IMPRESSION: No pneumothorax. CT demonstrated pulmonary nodules are not well seen radiographically. Electronically Signed   By: Luke Bun M.D.   On: 09/30/2023 16:23   DG C-Arm 1-60 Min-No Report Result Date: 09/30/2023 Fluoroscopy was utilized by the requesting physician.  No radiographic interpretation.   VAS US  MESENTERIC Result Date: 09/23/2023 ABDOMINAL VISCERAL Patient Name:  TALAYAH PICARDI  Date of Exam:   09/20/2023 Medical Rec #: [id number]            Accession #:    [id number] Date of Birth: 1951-03-25            Patient Gender: F Patient Age:   73 years Exam Location:  [facility] Procedure:      VAS US  MESENTERIC Referring Phys: [id number] ORVIN BRAVO BROWN -------------------------------------------------------------------------------- High Risk Factors: Hypertension, past history of smoking. Other Factors: Post prandial pain and nausea one week. Vascular Interventions: Celiac artery stent on 02/05/22. Performing Technologist: Donnice Charnley RVT  Examination Guidelines: A complete evaluation includes B-mode imaging, spectral Doppler, color Doppler, and power Doppler as needed of all accessible portions of each vessel. Bilateral testing is considered an integral part of a complete examination. Limited examinations for reoccurring indications may be performed as noted.  Duplex Findings: +----------------------+--------+--------+------+--------+ Mesenteric            PSV cm/sEDV  cm/sPlaqueComments +----------------------+--------+--------+------+--------+ Aorta Prox               86                          +----------------------+--------+--------+------+--------+ Celiac Artery Origin    224      41          stent   +----------------------+--------+--------+------+--------+ Celiac Artery Proximal  193      35                  +----------------------+--------+--------+------+--------+ SMA Origin              179      29                  +----------------------+--------+--------+------+--------+ SMA Proximal            180      32                  +----------------------+--------+--------+------+--------+ SMA Mid                 176  27                  +----------------------+--------+--------+------+--------+ CHA                     179      40                  +----------------------+--------+--------+------+--------+ Splenic                 123      29                  +----------------------+--------+--------+------+--------+ IMA                      71      11                  +----------------------+--------+--------+------+--------+    Summary: Largest Aortic Diameter: 2.2 cm  Mesenteric: Normal Splenic artery, Hepatic artery, Inferior Mesenteric artery and Superior Mesenteric artery findings. 70 to 99% stenosis in the celiac artery.  *See table(s) above for measurements and observations.  Diagnosing physician: Selinda Gu MD  Electronically signed by Selinda Gu MD on 09/23/2023 at 10:52:00 AM.    Final     Microbiology: No results found for this or any previous visit (from the past 240 hours).   Labs: Basic Metabolic Panel: Recent Labs  Lab 10/03/23 1400 10/04/23 0704 10/05/23 0442  NA 138 138 135  K 3.8 3.9 3.7  CL 103 101 100  CO2 22 25 25   GLUCOSE 91 113* 128*  BUN 12 12 11   CREATININE 1.04* 0.86 0.89  CALCIUM  9.7 9.3 9.3   Liver Function Tests: Recent Labs  Lab 10/03/23 1530  AST 24  ALT 41   ALKPHOS 44  BILITOT 0.6  PROT 7.1  ALBUMIN 4.0   Recent Labs  Lab 10/03/23 1530  LIPASE 22   No results for input(s): AMMONIA in the last 168 hours. CBC: Recent Labs  Lab 10/03/23 1400 10/04/23 0704 10/05/23 0442  WBC 8.5 8.3 11.8*  HGB 12.9 11.7* 11.1*  HCT 40.5 37.6 35.2*  MCV 78.2* 79.0* 77.7*  PLT 316 303 323   Cardiac Enzymes: No results for input(s): CKTOTAL, CKMB, CKMBINDEX, TROPONINI in the last 168 hours. BNP: BNP (last 3 results) No results for input(s): BNP in the last 8760 hours.  ProBNP (last 3 results) No results for input(s): PROBNP in the last 8760 hours.  CBG: No results for input(s): GLUCAP in the last 168 hours.     Signed:  Devaughn KATHEE Ban MD.  [facility] 10/05/2023, 12:51 PM

## 2023-10-06 ENCOUNTER — Ambulatory Visit (INDEPENDENT_AMBULATORY_CARE_PROVIDER_SITE_OTHER): Payer: Medicare PPO | Admitting: Nurse Practitioner

## 2023-10-24 ENCOUNTER — Other Ambulatory Visit: Payer: Self-pay | Admitting: Family Medicine

## 2023-10-24 DIAGNOSIS — Z1231 Encounter for screening mammogram for malignant neoplasm of breast: Secondary | ICD-10-CM

## 2023-10-28 ENCOUNTER — Encounter: Payer: Self-pay | Admitting: Pulmonary Disease

## 2023-11-07 ENCOUNTER — Encounter (INDEPENDENT_AMBULATORY_CARE_PROVIDER_SITE_OTHER): Payer: Self-pay | Admitting: Nurse Practitioner

## 2023-11-07 ENCOUNTER — Ambulatory Visit (INDEPENDENT_AMBULATORY_CARE_PROVIDER_SITE_OTHER): Payer: 59 | Admitting: Nurse Practitioner

## 2023-11-07 VITALS — BP 150/72 | HR 68 | Resp 16 | Wt 232.4 lb

## 2023-11-07 DIAGNOSIS — I10 Essential (primary) hypertension: Secondary | ICD-10-CM | POA: Diagnosis not present

## 2023-11-07 DIAGNOSIS — I774 Celiac artery compression syndrome: Secondary | ICD-10-CM

## 2023-11-07 DIAGNOSIS — R1084 Generalized abdominal pain: Secondary | ICD-10-CM | POA: Diagnosis not present

## 2023-11-07 NOTE — Progress Notes (Signed)
Subjective:    Patient ID: Teresa Sparks, female    DOB: 1951-03-31, 73 y.o.   MRN: [id number] Chief Complaint  Patient presents with   Follow-up    [facility] 4 week follow up    The patient returns today following recent mesenteric angiogram on 10/04/2023.  The patient had been having abdominal pain and there was concern that she may have mesenteric ischemia.  2023 she had a stent placed in her celiac artery.  Her symptoms were similar to when she had her stent placed including abdominal pain, postprandial symptoms with nausea and vomiting.  Her angiogram showed that her celiac artery was widely patent and no other significant mesenteric abnormalities.  She also underwent a mesenteric duplex on 09/20/2023 which showed no significant stenosis of the mesenteric and celiac artery vessels.    Review of Systems  Gastrointestinal:  Positive for abdominal pain.  All other systems reviewed and are negative.      Objective:   Physical Exam Vitals reviewed.  HENT:     Head: Normocephalic.  Cardiovascular:     Rate and Rhythm: Normal rate.  Pulmonary:     Effort: Pulmonary effort is normal.  Skin:    General: Skin is warm and dry.  Neurological:     Mental Status: She is alert and oriented to person, place, and time.  Psychiatric:        Mood and Affect: Mood normal.        Behavior: Behavior normal.        Thought Content: Thought content normal.        Judgment: Judgment normal.     BP (!) 150/72   Pulse 68   Resp 16   Wt 232 lb 6.4 oz (105.4 kg)   BMI 34.32 kg/m   Past Medical History:  Diagnosis Date   Adenomatous colon polyp    Adrenal adenoma    Allergic genetic state    Arthritis    Chronic abdominal pain    GERD (gastroesophageal reflux disease)    Hiatal hernia    Hypertension    Sleep apnea     Social History   Socioeconomic History   Marital status: Married    Spouse name: Not on file   Number of children: Not on file   Years of education: Not on  file   Highest education level: Not on file  Occupational History   Not on file  Tobacco Use   Smoking status: Former    Average packs/day: 0.3 packs/day for 2.0 years (0.5 ttl pk-yrs)    Types: Cigarettes    Start date: 67   Smokeless tobacco: Current    Types: Snuff  Vaping Use   Vaping status: Never Used  Substance and Sexual Activity   Alcohol use: No   Drug use: Never   Sexual activity: Not Currently  Other Topics Concern   Not on file  Social History Narrative   Not on file   Social Drivers of Health   Financial Resource Strain: Not on file  Food Insecurity: No Food Insecurity (10/04/2023)   Hunger Vital Sign    Worried About Running Out of Food in the Last Year: Never true    Ran Out of Food in the Last Year: Never true  Transportation Needs: No Transportation Needs (10/04/2023)   PRAPARE - Administrator, Civil Service (Medical): No    Lack of Transportation (Non-Medical): No  Physical Activity: Not on file  Stress: Not on  file  Social Connections: Moderately Integrated (10/04/2023)   Social Connection and Isolation Panel [NHANES]    Frequency of Communication with Friends and Family: More than three times a week    Frequency of Social Gatherings with Friends and Family: Three times a week    Attends Religious Services: More than 4 times per year    Active Member of Clubs or Organizations: Yes    Attends Banker Meetings: More than 4 times per year    Marital Status: Widowed  Intimate Partner Violence: Not At Risk (10/04/2023)   Humiliation, Afraid, Rape, and Kick questionnaire    Fear of Current or Ex-Partner: No    Emotionally Abused: No    Physically Abused: No    Sexually Abused: No    Past Surgical History:  Procedure Laterality Date   ABDOMINAL HYSTERECTOMY     CHOLECYSTECTOMY     COLONOSCOPY     COLONOSCOPY WITH PROPOFOL N/A 07/24/2018   Procedure: COLONOSCOPY WITH PROPOFOL;  Surgeon: Scot Jun, MD;  Location: [facility]  ENDOSCOPY;  Service: Endoscopy;  Laterality: N/A;   ENDOBRONCHIAL ULTRASOUND Bilateral 09/30/2023   Procedure: ENDOBRONCHIAL ULTRASOUND;  Surgeon: Salena Saner, MD;  Location: [facility] ORS;  Service: Pulmonary;  Laterality: Bilateral;   FRACTURE SURGERY     JOINT REPLACEMENT Right    6 years ago   VISCERAL ANGIOGRAPHY N/A 02/05/2022   Procedure: VISCERAL ANGIOGRAPHY;  Surgeon: Renford Dills, MD;  Location: [facility] INVASIVE CV LAB;  Service: Cardiovascular;  Laterality: N/A;   VISCERAL ANGIOGRAPHY N/A 10/04/2023   Procedure: VISCERAL ANGIOGRAPHY;  Surgeon: Renford Dills, MD;  Location: [facility] INVASIVE CV LAB;  Service: Cardiovascular;  Laterality: N/A;    Family History  Problem Relation Age of Onset   Hypertension Mother    Stroke Mother    Epilepsy Mother    Colon polyps Sister    Breast cancer Other     Allergies  Allergen Reactions   Iodinated Contrast Media Anaphylaxis    Other reaction(s): Vomiting   Ciprofloxacin Diarrhea and Nausea Only    Nausea, vomiting  Other reaction(s): Vomiting   Codeine Sulfate [Codeine] Diarrhea    Nausea, vomiting   Meloxicam    Penicillins Nausea Only   Protonix [Pantoprazole Sodium] Hives   Shellfish Allergy Swelling   Sulfa Antibiotics Rash       Latest Ref Rng & Units 10/05/2023    4:42 AM 10/04/2023    7:04 AM 10/03/2023    2:00 PM  CBC  WBC 4.0 - 10.5 K/uL 11.8  8.3  8.5   Hemoglobin 12.0 - 15.0 g/dL 16.1  09.6  04.5   Hematocrit 36.0 - 46.0 % 35.2  37.6  40.5   Platelets 150 - 400 K/uL 323  303  316       CMP     Component Value Date/Time   NA 135 10/05/2023 0442   NA 138 05/08/2014 0151   K 3.7 10/05/2023 0442   K 3.8 05/08/2014 0151   CL 100 10/05/2023 0442   CL 106 05/08/2014 0151   CO2 25 10/05/2023 0442   CO2 23 05/08/2014 0151   GLUCOSE 128 (H) 10/05/2023 0442   GLUCOSE 97 05/08/2014 0151   BUN 11 10/05/2023 0442   BUN 16 05/08/2014 0151   CREATININE 0.89 10/05/2023 0442   CREATININE 1.25 05/08/2014 0151    CALCIUM 9.3 10/05/2023 0442   CALCIUM 8.8 05/08/2014 0151   PROT 7.1 10/03/2023 1530   PROT 7.6 05/08/2014 0151  ALBUMIN 4.0 10/03/2023 1530   ALBUMIN 3.7 05/08/2014 0151   AST 24 10/03/2023 1530   AST 12 (L) 05/08/2014 0151   ALT 41 10/03/2023 1530   ALT 20 05/08/2014 0151   ALKPHOS 44 10/03/2023 1530   ALKPHOS 57 05/08/2014 0151   BILITOT 0.6 10/03/2023 1530   BILITOT 0.1 (L) 05/08/2014 0151   GFRNONAA >60 10/05/2023 0442   GFRNONAA 46 (L) 05/08/2014 0151     No results found.     Assessment & Plan:   1. Celiac artery stenosis (HCC) (Primary) Recommend:  The patient has evidence of chronic asymptomatic mesenteric atherosclerosis.  The patient denies lifestyle limiting changes at this point in time.  Given the lack of symptoms no intervention is warranted at this time.   No further invasive studies, angiography or surgery at this time  The patient should continue walking and begin a more formal exercise program.   The patient should continue antiplatelet therapy and aggressive treatment of the lipid abnormalities  Patient should undergo noninvasive studies as ordered. The patient will follow up with me after the studies.   2. Abdominal pain, generalized Patient will continue with workup by GI.  She does have an upcoming endoscopy as well as colonoscopy.  3. Essential hypertension Continue antihypertensive medications as already ordered, these medications have been reviewed and there are no changes at this time.   Current Outpatient Medications on File Prior to Visit  Medication Sig Dispense Refill   acetaminophen (TYLENOL) 650 MG CR tablet Take 650 mg by mouth every 8 (eight) hours as needed for pain.     aspirin 81 MG chewable tablet Chew by mouth daily.     atorvastatin (LIPITOR) 10 MG tablet Take 1 tablet (10 mg total) by mouth daily at 6 PM. 30 tablet 2   cetirizine (ZYRTEC) 10 MG tablet Take 10 mg by mouth daily.     colchicine 0.6 MG tablet Take by mouth.      cyclobenzaprine (FLEXERIL) 5 MG tablet Take 5 mg by mouth as needed.     DULoxetine (CYMBALTA) 30 MG capsule Take 30 mg by mouth daily.     ferrous sulfate 325 (65 FE) MG tablet Take 325 mg by mouth daily with breakfast.     fluticasone (FLONASE) 50 MCG/ACT nasal spray Place into both nostrils daily.     hydrALAZINE (APRESOLINE) 50 MG tablet Take 50 mg by mouth 2 (two) times daily.     lisinopril-hydrochlorothiazide (ZESTORETIC) 10-12.5 MG tablet Take 1 tablet by mouth daily.     metoprolol succinate (TOPROL-XL) 25 MG 24 hr tablet Take 25 mg by mouth daily.     Multiple Vitamins-Minerals (CENTRUM ADULTS PO) Take by mouth daily.     omeprazole (PRILOSEC) 40 MG capsule Take 40 mg by mouth 2 (two) times daily.     PARoxetine (PAXIL) 10 MG tablet Take 1 tablet by mouth daily at 6 (six) AM.     famotidine (PEPCID) 20 MG tablet Take 1 tablet (20 mg total) by mouth daily for 10 days. 10 tablet 0   No current facility-administered medications on file prior to visit.    There are no Patient Instructions on file for this visit. No follow-ups on file.   Georgiana Spinner, NP

## 2023-11-08 ENCOUNTER — Encounter (INDEPENDENT_AMBULATORY_CARE_PROVIDER_SITE_OTHER): Payer: Self-pay | Admitting: Nurse Practitioner

## 2023-11-17 ENCOUNTER — Ambulatory Visit: Payer: Medicare PPO | Admitting: Pulmonary Disease

## 2023-11-23 ENCOUNTER — Ambulatory Visit: Payer: 59 | Admitting: Certified Registered Nurse Anesthetist

## 2023-11-23 ENCOUNTER — Encounter: Payer: Self-pay | Admitting: Internal Medicine

## 2023-11-23 ENCOUNTER — Ambulatory Visit
Admission: RE | Admit: 2023-11-23 | Discharge: 2023-11-23 | Disposition: A | Discharge status: Home / Self Care | Payer: 59 | Attending: Internal Medicine | Admitting: Internal Medicine

## 2023-11-23 ENCOUNTER — Encounter
Admission: RE | Disposition: A | Discharge status: Home / Self Care | Payer: Self-pay | Source: Home / Self Care | Attending: Internal Medicine

## 2023-11-23 DIAGNOSIS — K5909 Other constipation: Secondary | ICD-10-CM | POA: Diagnosis not present

## 2023-11-23 DIAGNOSIS — K449 Diaphragmatic hernia without obstruction or gangrene: Secondary | ICD-10-CM | POA: Diagnosis not present

## 2023-11-23 DIAGNOSIS — K219 Gastro-esophageal reflux disease without esophagitis: Secondary | ICD-10-CM | POA: Diagnosis not present

## 2023-11-23 DIAGNOSIS — K640 First degree hemorrhoids: Secondary | ICD-10-CM | POA: Diagnosis not present

## 2023-11-23 DIAGNOSIS — R1310 Dysphagia, unspecified: Secondary | ICD-10-CM | POA: Insufficient documentation

## 2023-11-23 DIAGNOSIS — D124 Benign neoplasm of descending colon: Secondary | ICD-10-CM | POA: Insufficient documentation

## 2023-11-23 DIAGNOSIS — D122 Benign neoplasm of ascending colon: Secondary | ICD-10-CM | POA: Insufficient documentation

## 2023-11-23 DIAGNOSIS — Z79899 Other long term (current) drug therapy: Secondary | ICD-10-CM | POA: Insufficient documentation

## 2023-11-23 DIAGNOSIS — R1013 Epigastric pain: Secondary | ICD-10-CM | POA: Insufficient documentation

## 2023-11-23 DIAGNOSIS — Z1211 Encounter for screening for malignant neoplasm of colon: Secondary | ICD-10-CM | POA: Insufficient documentation

## 2023-11-23 DIAGNOSIS — G4730 Sleep apnea, unspecified: Secondary | ICD-10-CM | POA: Diagnosis not present

## 2023-11-23 DIAGNOSIS — K2970 Gastritis, unspecified, without bleeding: Secondary | ICD-10-CM | POA: Diagnosis not present

## 2023-11-23 DIAGNOSIS — Z87891 Personal history of nicotine dependence: Secondary | ICD-10-CM | POA: Diagnosis not present

## 2023-11-23 DIAGNOSIS — I10 Essential (primary) hypertension: Secondary | ICD-10-CM | POA: Insufficient documentation

## 2023-11-23 HISTORY — PX: POLYPECTOMY: SHX5525

## 2023-11-23 HISTORY — PX: ESOPHAGOGASTRODUODENOSCOPY (EGD) WITH PROPOFOL: SHX5813

## 2023-11-23 HISTORY — PX: HOT HEMOSTASIS: SHX5433

## 2023-11-23 HISTORY — PX: HEMOSTASIS CLIP PLACEMENT: SHX6857

## 2023-11-23 HISTORY — PX: COLONOSCOPY WITH PROPOFOL: SHX5780

## 2023-11-23 SURGERY — COLONOSCOPY WITH PROPOFOL
Anesthesia: General

## 2023-11-23 MED ORDER — PROPOFOL 10 MG/ML IV BOLUS
INTRAVENOUS | Status: DC | PRN
  Administered 2023-11-23: 20 mg via INTRAVENOUS
  Administered 2023-11-23: 80 mg via INTRAVENOUS
  Administered 2023-11-23: 125 ug/kg/min via INTRAVENOUS

## 2023-11-23 MED ORDER — LIDOCAINE HCL (PF) 2 % IJ SOLN
INTRAMUSCULAR | Status: AC
  Filled 2023-11-23: qty 5

## 2023-11-23 MED ORDER — DEXMEDETOMIDINE HCL IN NACL 80 MCG/20ML IV SOLN
INTRAVENOUS | Status: DC | PRN
  Administered 2023-11-23: 8 ug via INTRAVENOUS
  Administered 2023-11-23: 4 ug via INTRAVENOUS

## 2023-11-23 MED ORDER — PROPOFOL 1000 MG/100ML IV EMUL
INTRAVENOUS | Status: AC
  Filled 2023-11-23: qty 100

## 2023-11-23 MED ORDER — SODIUM CHLORIDE 0.9 % IV SOLN: INTRAVENOUS | Status: DC

## 2023-11-23 MED ORDER — DEXMEDETOMIDINE HCL IN NACL 80 MCG/20ML IV SOLN
INTRAVENOUS | Status: AC
  Filled 2023-11-23: qty 20

## 2023-11-23 MED ORDER — LIDOCAINE HCL (CARDIAC) PF 100 MG/5ML IV SOSY
PREFILLED_SYRINGE | INTRAVENOUS | Status: DC | PRN
  Administered 2023-11-23: 50 mg via INTRAVENOUS

## 2023-11-23 NOTE — Anesthesia Procedure Notes (Signed)
 Date/Time: 11/23/2023 8:34 AM  Performed by: Ginger Carne, CRNAPre-anesthesia Checklist: Patient identified, Emergency Drugs available, Suction available, Patient being monitored and Timeout performed Patient Re-evaluated:Patient Re-evaluated prior to induction Oxygen Delivery Method: Nasal cannula Preoxygenation: Pre-oxygenation with 100% oxygen Induction Type: IV induction

## 2023-11-23 NOTE — Interval H&P Note (Signed)
 History and Physical Interval Note:  11/23/2023 8:32 AM  [name]  has presented today for surgery, with the diagnosis of K21.9 (ICD-10-CM) - Gastroesophageal reflux disease, unspecified whether esophagitis present Z86.0100 (ICD-10-CM) - History of colon polyps K59.09 (ICD-10-CM) - Chronic constipation R13.10 (ICD-10-CM) - Dysphagia, unspecified type.  The various methods of treatment have been discussed with the patient and family. After consideration of risks, benefits and other options for treatment, the patient has consented to  Procedure(s): COLONOSCOPY WITH PROPOFOL (N/A) ESOPHAGOGASTRODUODENOSCOPY (EGD) WITH PROPOFOL (N/A) as a surgical intervention.  The patient's history has been reviewed, patient examined, no change in status, stable for surgery.  I have reviewed the patient's chart and labs.  Questions were answered to the patient's satisfaction.     [place], [place]

## 2023-11-23 NOTE — Op Note (Signed)
 [facility] Patient Name: Teresa Sparks Procedure Date: 11/23/2023 7:25 AM MRN: [id number] Account #: [id number] Date of Birth: Feb 04, 1951 Admit Type: Outpatient Age: 73 Room: [facility] ENDO ROOM 2 Gender: Female Note Status: Finalized Instrument Name: Upper Endoscope [id number] Procedure:             Upper GI endoscopy Indications:           Epigastric abdominal pain, Dysphagia,                         Gastro-esophageal reflux disease Providers:             Boykin Nearing. Norma Fredrickson MD, MD Referring MD:          Rhona Leavens. Burnett Sheng, MD (Referring MD) Medicines:             Propofol per Anesthesia Complications:         No immediate complications. Procedure:             Pre-Anesthesia Assessment:                        - The risks and benefits of the procedure and the                         sedation options and risks were discussed with the                         patient. All questions were answered and informed                         consent was obtained.                        - Patient identification and proposed procedure were                         verified prior to the procedure by the nurse. The                         procedure was verified in the procedure room.                        - ASA Grade Assessment: III - A patient with severe                         systemic disease.                        - After reviewing the risks and benefits, the patient                         was deemed in satisfactory condition to undergo the                         procedure.                        After obtaining informed consent, the endoscope was                         passed under  direct vision. Throughout the procedure,                         the patient's blood pressure, pulse, and oxygen                         saturations were monitored continuously. The Endoscope                         was introduced through the mouth, and advanced to the                          third part of duodenum. The upper GI endoscopy was                         accomplished without difficulty. The patient tolerated                         the procedure well. Findings:      The esophagus was normal.      Diffuse minimal inflammation characterized by erythema was found in the       entire examined stomach.      The exam of the stomach was otherwise normal.      The examined duodenum was normal.      The exam was otherwise without abnormality. Impression:            - Normal esophagus.                        - Gastritis.                        - Normal examined duodenum.                        - The examination was otherwise normal.                        - No specimens collected. Recommendation:        - Proceed with colonoscopy                        - Resume regular use of PPI Omeprazole or Esomeprazole                         as prescribed. Procedure Code(s):     --- Professional ---                        [phone], Esophagogastroduodenoscopy, flexible,                         transoral; diagnostic, including collection of                         specimen(s) by brushing or washing, when performed                         (separate procedure) Diagnosis Code(s):     --- Professional ---  K21.9, Gastro-esophageal reflux disease without                         esophagitis                        R13.10, Dysphagia, unspecified                        R10.13, Epigastric pain                        K29.70, Gastritis, unspecified, without bleeding CPT copyright 2022 American Medical Association. All rights reserved. The codes documented in this report are preliminary and upon coder review may  be revised to meet current compliance requirements. Stanton Kidney MD, MD 11/23/2023 8:45:20 AM This report has been signed electronically. Number of Addenda: 0 Note Initiated On: 11/23/2023 7:25 AM Estimated Blood Loss:  Estimated blood loss: none.       [facility]

## 2023-11-23 NOTE — Anesthesia Postprocedure Evaluation (Signed)
 Anesthesia Post Note  Patient: Teresa Sparks  Procedure(s) Performed: COLONOSCOPY WITH PROPOFOL ESOPHAGOGASTRODUODENOSCOPY (EGD) WITH PROPOFOL HOT HEMOSTASIS (ARGON PLASMA COAGULATION/BICAP) POLYPECTOMY HEMOSTASIS CLIP PLACEMENT  Patient location during evaluation: Endoscopy Anesthesia Type: General Level of consciousness: awake and alert Pain management: pain level controlled Vital Signs Assessment: post-procedure vital signs reviewed and stable Respiratory status: spontaneous breathing, nonlabored ventilation, respiratory function stable and patient connected to nasal cannula oxygen Cardiovascular status: blood pressure returned to baseline and stable Postop Assessment: no apparent nausea or vomiting Anesthetic complications: no   No notable events documented.   Last Vitals:  Vitals:   11/23/23 0924 11/23/23 0930  BP: (!) 133/57 132/60  Pulse: 73 74  Resp: 15 14  Temp:    SpO2: 98% 98%    Last Pain:  Vitals:   11/23/23 0924  TempSrc:   PainSc: 0-No pain                 Cleda Mccreedy Rosalynn Sergent

## 2023-11-23 NOTE — H&P (Signed)
 Outpatient short stay form Pre-procedure 11/23/2023 8:31 AM Teresa Sparks K. Norma Fredrickson, M.D.  Primary Physician: Jerl Mina, M.D.  Reason for visit:  GERD, constipation, dysphagia, personal history of colon polyps  History of present illness:  Ms. Teresa Sparks reports having issues w/ epigastric pain over the past few months, worsened in Jan 2025 and was seen in [facility] as above. She reports pain can be before eating but is more frequent after eating. Describes it as a cramping stabbing sensation in EG and RUQ area. She denies any nausea or vomiting. She does have GERD symptoms frequently. She reports that she is on Pepcid but is no longer taking PPI. She is waking up frequently with reflux in the middle of the night. She does have some dysphagia to solids as well. she feels the foods will pass eventually but is having to stop eating at times due to the dysphagia. she denies any liquid or pill dysphagia.  She reports having some issues with chronic constipation, she generally is having a bowel movement every 2 to 3 days. She reports she has tried stool softener and MiraLAX as needed but never consistently. She denies any frequent lower abdominal pain. She denies any rectal bleeding. Has had some intermittent hemorrhoid symptoms with constipation that improved with Preparation H.     Current Facility-Administered Medications:    0.9 %  sodium chloride infusion, , Intravenous, Continuous, Louisa Favaro, Boykin Nearing, MD, Last Rate: 20 mL/hr at 11/23/23 0741, New Bag at 11/23/23 0741  Medications Prior to Admission  Medication Sig Dispense Refill Last Dose/Taking   atorvastatin (LIPITOR) 10 MG tablet Take 1 tablet (10 mg total) by mouth daily at 6 PM. 30 tablet 2 11/22/2023   famotidine (PEPCID) 20 MG tablet Take 1 tablet (20 mg total) by mouth daily for 10 days. 10 tablet 0 11/22/2023   ferrous sulfate 325 (65 FE) MG tablet Take 325 mg by mouth daily with breakfast.   Past Week   hydrALAZINE (APRESOLINE) 50 MG tablet  Take 50 mg by mouth 2 (two) times daily.   11/23/2023 Morning   lisinopril-hydrochlorothiazide (ZESTORETIC) 10-12.5 MG tablet Take 1 tablet by mouth daily.   11/22/2023   metoprolol succinate (TOPROL-XL) 25 MG 24 hr tablet Take 25 mg by mouth daily.   11/22/2023   Multiple Vitamins-Minerals (CENTRUM ADULTS PO) Take by mouth daily.   Past Week   omeprazole (PRILOSEC) 40 MG capsule Take 40 mg by mouth 2 (two) times daily.   11/22/2023   acetaminophen (TYLENOL) 650 MG CR tablet Take 650 mg by mouth every 8 (eight) hours as needed for pain.      aspirin 81 MG chewable tablet Chew by mouth daily.   11/19/2023   cetirizine (ZYRTEC) 10 MG tablet Take 10 mg by mouth daily.      colchicine 0.6 MG tablet Take by mouth.      cyclobenzaprine (FLEXERIL) 5 MG tablet Take 5 mg by mouth as needed. (Patient not taking: Reported on 11/23/2023)   Not Taking   DULoxetine (CYMBALTA) 30 MG capsule Take 30 mg by mouth daily.      fluticasone (FLONASE) 50 MCG/ACT nasal spray Place into both nostrils daily.      PARoxetine (PAXIL) 10 MG tablet Take 1 tablet by mouth daily at 6 (six) AM.        Allergies  Allergen Reactions   Iodinated Contrast Media Anaphylaxis    Other reaction(s): Vomiting   Ciprofloxacin Diarrhea and Nausea Only    Nausea, vomiting  Other reaction(s):  Vomiting   Codeine Sulfate [Codeine] Diarrhea    Nausea, vomiting   Meloxicam    Penicillins Nausea Only   Protonix [Pantoprazole Sodium] Hives   Shellfish Allergy Swelling   Sulfa Antibiotics Rash     Past Medical History:  Diagnosis Date   Adenomatous colon polyp    Adrenal adenoma    Allergic genetic state    Arthritis    Chronic abdominal pain    GERD (gastroesophageal reflux disease)    Hiatal hernia    Hypertension    Sleep apnea     Review of systems:  Otherwise negative.    Physical Exam  Gen: Alert, oriented. Appears stated age.  HEENT: [place]/AT. PERRLA. Lungs: CTA, no wheezes. CV: RR nl S1, S2. Abd: soft, benign, no  masses. BS+ Ext: No edema. Pulses 2+    Planned procedures: Proceed with EGD and  colonoscopy. The patient understands the nature of the planned procedure, indications, risks, alternatives and potential complications including but not limited to bleeding, infection, perforation, damage to internal organs and possible oversedation/side effects from anesthesia. The patient agrees and gives consent to proceed.  Please refer to procedure notes for findings, recommendations and patient disposition/instructions.     Sherrod Toothman K. Norma Fredrickson, M.D. Gastroenterology 11/23/2023  8:31 AM

## 2023-11-23 NOTE — Op Note (Signed)
 [facility] Patient Name: Teresa Sparks Procedure Date: 11/23/2023 7:23 AM MRN: [id number] Account #: [id number] Date of Birth: [birth year] Admit Type: Outpatient Age: 73 Room: [facility] ENDO ROOM 2 Gender: Female Note Status: Finalized Instrument Name: Prentice Docker [id number] Procedure:             Colonoscopy Indications:           Surveillance: Personal history of adenomatous polyps                         on last colonoscopy > 5 years ago Providers:             Royce Macadamia K. Norma Fredrickson MD, MD Referring MD:          Rhona Leavens. Burnett Sheng, MD (Referring MD) Medicines:             Propofol per Anesthesia Complications:         No immediate complications. Estimated blood loss: None. Procedure:             Pre-Anesthesia Assessment:                        - The risks and benefits of the procedure and the                         sedation options and risks were discussed with the                         patient. All questions were answered and informed                         consent was obtained.                        - Patient identification and proposed procedure were                         verified prior to the procedure by the nurse. The                         procedure was verified in the procedure room.                        - ASA Grade Assessment: III - A patient with severe                         systemic disease.                        - After reviewing the risks and benefits, the patient                         was deemed in satisfactory condition to undergo the                         procedure.                        After obtaining informed consent, the colonoscope was  passed under direct vision. Throughout the procedure,                         the patient's blood pressure, pulse, and oxygen                         saturations were monitored continuously. The                         Colonoscope was introduced through the  anus and                         advanced to the the cecum, identified by appendiceal                         orifice and ileocecal valve. The colonoscopy was                         performed without difficulty. The patient tolerated                         the procedure well. The quality of the bowel                         preparation was good. The ileocecal valve, appendiceal                         orifice, and rectum were photographed. The quality of                         the bowel preparation was good. The ileocecal valve,                         appendiceal orifice, and rectum were photographed. Findings:      The perianal and digital rectal examinations were normal. Pertinent       negatives include normal sphincter tone and no palpable rectal lesions.      Non-bleeding internal hemorrhoids were found during retroflexion. The       hemorrhoids were Grade I (internal hemorrhoids that do not prolapse).       Estimated blood loss: none.      Two semi-pedunculated polyps were found in the proximal ascending colon.       The polyps were 10 to 14 mm in size. These polyps were removed with a       hot snare. Resection and retrieval were complete. To prevent bleeding       after the polypectomy, one hemostatic clip was successfully placed (MR       conditional). Clip manufacturer: [employer]. There was no       bleeding during, or at the end, of the procedure.      Two sessile polyps were found in the descending colon. The polyps were 8       to 12 mm in size. These polyps were removed with a hot snare. Resection       and retrieval were complete.      The exam was otherwise without abnormality. Impression:            - Non-bleeding internal hemorrhoids.                        -  Two 10 to 14 mm polyps in the proximal ascending                         colon, removed with a hot snare. Resected and                         retrieved. Clip (MR conditional) was placed. Clip                          manufacturer: [employer].                        - Two 8 to 12 mm polyps in the descending colon,                         removed with a hot snare. Resected and retrieved.                        - The examination was otherwise normal. Recommendation:        - Patient has a contact number available for                         emergencies. The signs and symptoms of potential                         delayed complications were discussed with the patient.                         Return to normal activities tomorrow. Written                         discharge instructions were provided to the patient.                        - Resume previous diet.                        - Continue present medications.                        - Repeat colonoscopy is recommended for surveillance.                         The colonoscopy date will be determined after                         pathology results from today's exam become available                         for review.                        - Return to GI office in 3 months.                        - Consider switching PPI to other brand or double dose                         of esomeprazole to prevent GERD  symptoms.                        - Follow up with Tawni Pummel, PA-C at [facility]. [phone].                        - Telephone GI office to schedule appointment.                        - The findings and recommendations were discussed with                         the patient. Procedure Code(s):     --- Professional ---                        [phone], Colonoscopy, flexible; with removal of                         tumor(s), polyp(s), or other lesion(s) by snare                         technique Diagnosis Code(s):     --- Professional ---                        K64.0, First degree hemorrhoids                        D12.4, Benign neoplasm of descending colon                         D12.2, Benign neoplasm of ascending colon                        Z86.010, Personal history of colonic polyps CPT copyright 2022 American Medical Association. All rights reserved. The codes documented in this report are preliminary and upon coder review may  be revised to meet current compliance requirements. Stanton Kidney MD, MD 11/23/2023 9:13:09 AM This report has been signed electronically. Number of Addenda: 0 Note Initiated On: 11/23/2023 7:23 AM Scope Withdrawal Time: 0 hours 11 minutes 36 seconds  Total Procedure Duration: 0 hours 15 minutes 4 seconds  Estimated Blood Loss:  Estimated blood loss: none.      [facility]

## 2023-11-23 NOTE — Transfer of Care (Signed)
 Immediate Anesthesia Transfer of Care Note  Patient: Teresa Sparks  Procedure(s) Performed: COLONOSCOPY WITH PROPOFOL ESOPHAGOGASTRODUODENOSCOPY (EGD) WITH PROPOFOL HOT HEMOSTASIS (ARGON PLASMA COAGULATION/BICAP) POLYPECTOMY HEMOSTASIS CLIP PLACEMENT  Patient Location: Endoscopy Unit  Anesthesia Type:General  Level of Consciousness: awake and alert   Airway & Oxygen Therapy: Patient Spontanous Breathing and Patient connected to nasal cannula oxygen  Post-op Assessment: Report given to RN and Post -op Vital signs reviewed and stable  Post vital signs: Reviewed and stable  Last Vitals:  Vitals Value Taken Time  BP    Temp    Pulse    Resp    SpO2      Last Pain:  Vitals:   11/23/23 0712  TempSrc: Temporal         Complications: No notable events documented.

## 2023-11-23 NOTE — Anesthesia Preprocedure Evaluation (Addendum)
 Anesthesia Evaluation  Patient identified by MRN, date of birth, ID band Patient awake    Reviewed: Allergy & Precautions, NPO status , Patient's Chart, lab work & pertinent test results  History of Anesthesia Complications Negative for: history of anesthetic complications  Airway Mallampati: III  TM Distance: <3 FB Neck ROM: full    Dental  (+) Chipped, Implants   Pulmonary neg shortness of breath, sleep apnea , former smoker   Pulmonary exam normal        Cardiovascular Exercise Tolerance: Good hypertension, (-) angina Normal cardiovascular exam     Neuro/Psych  Neuromuscular disease  negative psych ROS   GI/Hepatic Neg liver ROS, hiatal hernia,GERD  Controlled,,  Endo/Other  negative endocrine ROS    Renal/GU negative Renal ROS  negative genitourinary   Musculoskeletal   Abdominal   Peds  Hematology negative hematology ROS (+)   Anesthesia Other Findings Past Medical History: No date: Adenomatous colon polyp No date: Adrenal adenoma No date: Allergic genetic state No date: Arthritis No date: Chronic abdominal pain No date: GERD (gastroesophageal reflux disease) No date: Hiatal hernia No date: Hypertension No date: Sleep apnea  Past Surgical History: No date: ABDOMINAL HYSTERECTOMY No date: CHOLECYSTECTOMY No date: COLONOSCOPY 07/24/2018: COLONOSCOPY WITH PROPOFOL; N/A     Comment:  Procedure: COLONOSCOPY WITH PROPOFOL;  Surgeon: Scot Jun, MD;  Location: [facility] ENDOSCOPY;  Service:               Endoscopy;  Laterality: N/A; 09/30/2023: ENDOBRONCHIAL ULTRASOUND; Bilateral     Comment:  Procedure: ENDOBRONCHIAL ULTRASOUND;  Surgeon: Salena Saner, MD;  Location: [facility] ORS;  Service: Pulmonary;                Laterality: Bilateral; No date: FRACTURE SURGERY No date: JOINT REPLACEMENT; Right     Comment:  6 years ago 02/05/2022: VISCERAL ANGIOGRAPHY; N/A     Comment:   Procedure: VISCERAL ANGIOGRAPHY;  Surgeon: Renford Dills, MD;  Location: [facility] INVASIVE CV LAB;  Service:              Cardiovascular;  Laterality: N/A; 10/04/2023: VISCERAL ANGIOGRAPHY; N/A     Comment:  Procedure: VISCERAL ANGIOGRAPHY;  Surgeon: Renford Dills, MD;  Location: [facility] INVASIVE CV LAB;  Service:              Cardiovascular;  Laterality: N/A;  BMI    Body Mass Index: 33.58 kg/m      Reproductive/Obstetrics negative OB ROS                             Anesthesia Physical Anesthesia Plan  ASA: 3  Anesthesia Plan: General   Post-op Pain Management:    Induction: Intravenous  PONV Risk Score and Plan: Propofol infusion and TIVA  Airway Management Planned: Natural Airway and Nasal Cannula  Additional Equipment:   Intra-op Plan:   Post-operative Plan:   Informed Consent: I have reviewed the patients History and Physical, chart, labs and discussed the procedure including the risks, benefits and alternatives for the proposed anesthesia with the patient or authorized representative who has indicated his/her understanding and acceptance.  Dental Advisory Given  Plan Discussed with: Anesthesiologist, CRNA and Surgeon  Anesthesia Plan Comments: (Patient consented for risks of anesthesia including but not limited to:  - adverse reactions to medications - risk of airway placement if required - damage to eyes, teeth, lips or other oral mucosa - nerve damage due to positioning  - sore throat or hoarseness - Damage to heart, brain, nerves, lungs, other parts of body or loss of life  Patient voiced understanding and assent.)       Anesthesia Quick Evaluation

## 2023-11-24 LAB — SURGICAL PATHOLOGY

## 2023-11-25 ENCOUNTER — Emergency Department: Payer: 59

## 2023-11-25 ENCOUNTER — Other Ambulatory Visit: Payer: Self-pay

## 2023-11-25 ENCOUNTER — Observation Stay
Admission: EM | Admit: 2023-11-25 | Discharge: 2023-11-26 | Disposition: A | Discharge status: Home / Self Care | Payer: 59 | Attending: Hospitalist | Admitting: Hospitalist

## 2023-11-25 ENCOUNTER — Encounter
Admission: EM | Disposition: A | Discharge status: Home / Self Care | Payer: Self-pay | Source: Home / Self Care | Attending: Emergency Medicine

## 2023-11-25 DIAGNOSIS — Z7982 Long term (current) use of aspirin: Secondary | ICD-10-CM | POA: Diagnosis not present

## 2023-11-25 DIAGNOSIS — Z95828 Presence of other vascular implants and grafts: Secondary | ICD-10-CM

## 2023-11-25 DIAGNOSIS — Z79899 Other long term (current) drug therapy: Secondary | ICD-10-CM | POA: Insufficient documentation

## 2023-11-25 DIAGNOSIS — R1084 Generalized abdominal pain: Principal | ICD-10-CM

## 2023-11-25 DIAGNOSIS — I10 Essential (primary) hypertension: Secondary | ICD-10-CM | POA: Insufficient documentation

## 2023-11-25 DIAGNOSIS — R509 Fever, unspecified: Secondary | ICD-10-CM | POA: Diagnosis present

## 2023-11-25 DIAGNOSIS — R7402 Elevation of levels of lactic acid dehydrogenase (LDH): Secondary | ICD-10-CM | POA: Diagnosis not present

## 2023-11-25 DIAGNOSIS — D72829 Elevated white blood cell count, unspecified: Secondary | ICD-10-CM

## 2023-11-25 DIAGNOSIS — I70201 Unspecified atherosclerosis of native arteries of extremities, right leg: Secondary | ICD-10-CM

## 2023-11-25 DIAGNOSIS — K2970 Gastritis, unspecified, without bleeding: Secondary | ICD-10-CM | POA: Diagnosis not present

## 2023-11-25 DIAGNOSIS — R1010 Upper abdominal pain, unspecified: Secondary | ICD-10-CM | POA: Diagnosis not present

## 2023-11-25 DIAGNOSIS — Z1152 Encounter for screening for COVID-19: Secondary | ICD-10-CM | POA: Diagnosis not present

## 2023-11-25 DIAGNOSIS — Z9889 Other specified postprocedural states: Secondary | ICD-10-CM

## 2023-11-25 DIAGNOSIS — R109 Unspecified abdominal pain: Secondary | ICD-10-CM | POA: Diagnosis present

## 2023-11-25 DIAGNOSIS — R6510 Systemic inflammatory response syndrome (SIRS) of non-infectious origin without acute organ dysfunction: Secondary | ICD-10-CM | POA: Diagnosis not present

## 2023-11-25 DIAGNOSIS — Z87891 Personal history of nicotine dependence: Secondary | ICD-10-CM | POA: Diagnosis not present

## 2023-11-25 HISTORY — PX: VISCERAL ANGIOGRAPHY: CATH118276

## 2023-11-25 LAB — COMPREHENSIVE METABOLIC PANEL
ALT: 20 U/L (ref 0–44)
AST: 24 U/L (ref 15–41)
Albumin: 4.1 g/dL (ref 3.5–5.0)
Alkaline Phosphatase: 48 U/L (ref 38–126)
Anion gap: 12 (ref 5–15)
BUN: 17 mg/dL (ref 8–23)
CO2: 22 mmol/L (ref 22–32)
Calcium: 9.8 mg/dL (ref 8.9–10.3)
Chloride: 105 mmol/L (ref 98–111)
Creatinine, Ser: 1.07 mg/dL — ABNORMAL HIGH (ref 0.44–1.00)
GFR, Estimated: 56 mL/min — ABNORMAL LOW (ref >60)
Glucose, Bld: 134 mg/dL — ABNORMAL HIGH (ref 70–99)
Potassium: 3.3 mmol/L — ABNORMAL LOW (ref 3.5–5.1)
Sodium: 139 mmol/L (ref 135–145)
Total Bilirubin: 0.6 mg/dL (ref 0.0–1.2)
Total Protein: 7.2 g/dL (ref 6.5–8.1)

## 2023-11-25 LAB — LACTIC ACID, PLASMA
Lactic Acid, Venous: 2 mmol/L (ref 0.5–1.9)
Lactic Acid, Venous: 1.7 mmol/L (ref 0.5–1.9)

## 2023-11-25 LAB — CBC
HCT: 36.9 % (ref 36.0–46.0)
Hemoglobin: 11.7 g/dL — ABNORMAL LOW (ref 12.0–15.0)
MCH: 25.2 pg — ABNORMAL LOW (ref 26.0–34.0)
MCHC: 31.7 g/dL (ref 30.0–36.0)
MCV: 79.4 fL — ABNORMAL LOW (ref 80.0–100.0)
Platelets: 322 10*3/uL (ref 150–400)
RBC: 4.65 MIL/uL (ref 3.87–5.11)
RDW: 15.5 % (ref 11.5–15.5)
WBC: 15 10*3/uL — ABNORMAL HIGH (ref 4.0–10.5)
nRBC: 0 % (ref 0.0–0.2)

## 2023-11-25 LAB — PROCALCITONIN: Procalcitonin: <0.1 ng/mL

## 2023-11-25 LAB — URINALYSIS, ROUTINE W REFLEX MICROSCOPIC
Bilirubin Urine: NEGATIVE
Glucose, UA: NEGATIVE mg/dL
Hgb urine dipstick: NEGATIVE
Ketones, ur: NEGATIVE mg/dL
Leukocytes,Ua: NEGATIVE
Nitrite: NEGATIVE
Protein, ur: NEGATIVE mg/dL
Specific Gravity, Urine: 1.021 (ref 1.005–1.030)
pH: 5 (ref 5.0–8.0)

## 2023-11-25 LAB — RESP PANEL BY RT-PCR (RSV, FLU A&B, COVID)  RVPGX2
Influenza A by PCR: NEGATIVE
Influenza B by PCR: NEGATIVE
Resp Syncytial Virus by PCR: NEGATIVE
SARS Coronavirus 2 by RT PCR: NEGATIVE

## 2023-11-25 LAB — LIPASE, BLOOD: Lipase: 23 U/L (ref 11–51)

## 2023-11-25 SURGERY — VISCERAL ANGIOGRAPHY
Anesthesia: Moderate Sedation

## 2023-11-25 MED ORDER — LABETALOL HCL 5 MG/ML IV SOLN
INTRAVENOUS | Status: AC
  Filled 2023-11-25: qty 4

## 2023-11-25 MED ORDER — SODIUM CHLORIDE 0.9 % IV SOLN: INTRAVENOUS | Status: AC

## 2023-11-25 MED ORDER — MIDAZOLAM HCL 2 MG/2ML IJ SOLN
INTRAMUSCULAR | Status: AC
Start: 2023-11-25 — End: ?
  Filled 2023-11-25: qty 2

## 2023-11-25 MED ORDER — LABETALOL HCL 5 MG/ML IV SOLN
10.0000 mg | INTRAVENOUS | Status: DC | PRN
  Administered 2023-11-25 (×2): 10 mg via INTRAVENOUS

## 2023-11-25 MED ORDER — DIPHENHYDRAMINE HCL 50 MG/ML IJ SOLN
INTRAMUSCULAR | Status: AC
  Filled 2023-11-25: qty 1

## 2023-11-25 MED ORDER — METHYLPREDNISOLONE SODIUM SUCC 40 MG IJ SOLR
40.0000 mg | Freq: Once | INTRAMUSCULAR | Status: AC
  Administered 2023-11-25: 40 mg via INTRAVENOUS
  Filled 2023-11-25: qty 1

## 2023-11-25 MED ORDER — DULOXETINE HCL 30 MG PO CPEP
30.0000 mg | ORAL_CAPSULE | Freq: Every day | ORAL | Status: DC
  Administered 2023-11-26: 30 mg via ORAL
  Filled 2023-11-25: qty 1

## 2023-11-25 MED ORDER — DROPERIDOL 2.5 MG/ML IJ SOLN
2.5000 mg | Freq: Once | INTRAMUSCULAR | Status: AC
  Administered 2023-11-25: 2.5 mg via INTRAVENOUS
  Filled 2023-11-25: qty 2

## 2023-11-25 MED ORDER — METHYLPREDNISOLONE SODIUM SUCC 125 MG IJ SOLR
INTRAMUSCULAR | Status: AC
Start: 2023-11-25 — End: ?
  Filled 2023-11-25: qty 2

## 2023-11-25 MED ORDER — MORPHINE SULFATE (PF) 2 MG/ML IV SOLN
1.0000 mg | Freq: Once | INTRAVENOUS | Status: AC
  Administered 2023-11-25: 1 mg via INTRAVENOUS
  Filled 2023-11-25: qty 1

## 2023-11-25 MED ORDER — SODIUM CHLORIDE 0.9% FLUSH: 3.0000 mL | INTRAVENOUS | Status: DC | PRN

## 2023-11-25 MED ORDER — SODIUM CHLORIDE 0.9 % IV BOLUS
1000.0000 mL | Freq: Once | INTRAVENOUS | Status: AC
  Administered 2023-11-25: 1000 mL via INTRAVENOUS

## 2023-11-25 MED ORDER — DIPHENHYDRAMINE HCL 50 MG/ML IJ SOLN
50.0000 mg | Freq: Once | INTRAMUSCULAR | Status: AC | PRN
  Administered 2023-11-25: 50 mg via INTRAVENOUS

## 2023-11-25 MED ORDER — METOPROLOL SUCCINATE ER 50 MG PO TB24
25.0000 mg | ORAL_TABLET | Freq: Every day | ORAL | Status: DC
  Administered 2023-11-26: 25 mg via ORAL
  Filled 2023-11-25: qty 1

## 2023-11-25 MED ORDER — LACTATED RINGERS IV SOLN: INTRAVENOUS | Status: DC

## 2023-11-25 MED ORDER — CEFAZOLIN SODIUM-DEXTROSE 2-4 GM/100ML-% IV SOLN
INTRAVENOUS | Status: AC
  Filled 2023-11-25: qty 100

## 2023-11-25 MED ORDER — ACETAMINOPHEN 500 MG PO TABS
1000.0000 mg | ORAL_TABLET | Freq: Once | ORAL | Status: AC
  Administered 2023-11-25: 1000 mg via ORAL
  Filled 2023-11-25: qty 2

## 2023-11-25 MED ORDER — FENTANYL CITRATE PF 50 MCG/ML IJ SOSY: 12.5000 ug | PREFILLED_SYRINGE | Freq: Once | INTRAMUSCULAR | Status: DC | PRN

## 2023-11-25 MED ORDER — MIDAZOLAM HCL 2 MG/ML PO SYRP: 8.0000 mg | ORAL_SOLUTION | Freq: Once | ORAL | Status: DC | PRN

## 2023-11-25 MED ORDER — MIDAZOLAM HCL 2 MG/2ML IJ SOLN
INTRAMUSCULAR | Status: DC | PRN
  Administered 2023-11-25: 2 mg via INTRAVENOUS

## 2023-11-25 MED ORDER — HEPARIN SODIUM (PORCINE) 1000 UNIT/ML IJ SOLN
INTRAMUSCULAR | Status: AC
  Filled 2023-11-25: qty 10

## 2023-11-25 MED ORDER — SODIUM CHLORIDE 0.9 % IV SOLN: 250.0000 mL | INTRAVENOUS | Status: DC | PRN

## 2023-11-25 MED ORDER — METHYLPREDNISOLONE SODIUM SUCC 125 MG IJ SOLR
125.0000 mg | Freq: Once | INTRAMUSCULAR | Status: AC | PRN
  Administered 2023-11-25: 125 mg via INTRAVENOUS

## 2023-11-25 MED ORDER — SODIUM CHLORIDE 0.9% FLUSH
3.0000 mL | Freq: Two times a day (BID) | INTRAVENOUS | Status: DC
  Administered 2023-11-25 – 2023-11-26 (×2): 3 mL via INTRAVENOUS

## 2023-11-25 MED ORDER — FENTANYL CITRATE PF 50 MCG/ML IJ SOSY
PREFILLED_SYRINGE | INTRAMUSCULAR | Status: AC
  Filled 2023-11-25: qty 1

## 2023-11-25 MED ORDER — MORPHINE SULFATE (PF) 4 MG/ML IV SOLN
4.0000 mg | Freq: Once | INTRAVENOUS | Status: AC
  Administered 2023-11-25: 4 mg via INTRAVENOUS
  Filled 2023-11-25: qty 1

## 2023-11-25 MED ORDER — ENOXAPARIN SODIUM 40 MG/0.4ML IJ SOSY: 40.0000 mg | PREFILLED_SYRINGE | INTRAMUSCULAR | Status: DC

## 2023-11-25 MED ORDER — ONDANSETRON HCL 4 MG/2ML IJ SOLN: 4.0000 mg | Freq: Once | INTRAMUSCULAR | Status: DC

## 2023-11-25 MED ORDER — FENTANYL CITRATE (PF) 100 MCG/2ML IJ SOLN
INTRAMUSCULAR | Status: DC | PRN
  Administered 2023-11-25: 50 ug via INTRAVENOUS

## 2023-11-25 MED ORDER — CEFAZOLIN SODIUM-DEXTROSE 2-4 GM/100ML-% IV SOLN
2.0000 g | INTRAVENOUS | Status: AC
  Administered 2023-11-25: 2 g via INTRAVENOUS

## 2023-11-25 MED ORDER — ASPIRIN 81 MG PO CHEW
81.0000 mg | CHEWABLE_TABLET | Freq: Every day | ORAL | Status: DC
  Administered 2023-11-26: 81 mg via ORAL
  Filled 2023-11-25: qty 1

## 2023-11-25 MED ORDER — ENOXAPARIN SODIUM 60 MG/0.6ML IJ SOSY
0.5000 mg/kg | PREFILLED_SYRINGE | INTRAMUSCULAR | Status: DC
  Administered 2023-11-25: 52.5 mg via SUBCUTANEOUS
  Filled 2023-11-25: qty 0.6

## 2023-11-25 MED ORDER — DIPHENHYDRAMINE HCL 50 MG/ML IJ SOLN: 50.0000 mg | Freq: Once | INTRAMUSCULAR | Status: DC

## 2023-11-25 MED ORDER — FAMOTIDINE 20 MG PO TABS
40.0000 mg | ORAL_TABLET | Freq: Once | ORAL | Status: AC | PRN
  Administered 2023-11-25: 40 mg via ORAL

## 2023-11-25 MED ORDER — LABETALOL HCL 5 MG/ML IV SOLN
INTRAVENOUS | Status: DC | PRN
  Administered 2023-11-25 (×2): 10 mg via INTRAVENOUS

## 2023-11-25 MED ORDER — PAROXETINE HCL 10 MG PO TABS
10.0000 mg | ORAL_TABLET | Freq: Every day | ORAL | Status: DC
  Administered 2023-11-26: 10 mg via ORAL
  Filled 2023-11-25: qty 1

## 2023-11-25 MED ORDER — HYDRALAZINE HCL 50 MG PO TABS
50.0000 mg | ORAL_TABLET | Freq: Two times a day (BID) | ORAL | Status: DC
  Administered 2023-11-25 – 2023-11-26 (×2): 50 mg via ORAL
  Filled 2023-11-25 (×2): qty 1

## 2023-11-25 MED ORDER — FAMOTIDINE 20 MG PO TABS
ORAL_TABLET | ORAL | Status: AC
  Filled 2023-11-25: qty 2

## 2023-11-25 MED ORDER — SODIUM CHLORIDE 0.9 % IV SOLN: INTRAVENOUS | Status: DC

## 2023-11-25 MED ORDER — HYDRALAZINE HCL 20 MG/ML IJ SOLN: 5.0000 mg | INTRAMUSCULAR | Status: DC | PRN

## 2023-11-25 MED ORDER — DIPHENHYDRAMINE HCL 25 MG PO CAPS: 50.0000 mg | ORAL_CAPSULE | Freq: Once | ORAL | Status: DC

## 2023-11-25 SURGICAL SUPPLY — 18 items
CATH ANGIO 5F PIGTAIL 65CM (CATHETERS) IMPLANT
CATH VS15FR (CATHETERS) IMPLANT
COVER PROBE ULTRASOUND 5X96 (MISCELLANEOUS) IMPLANT
DEVICE STARCLOSE SE CLOSURE (Vascular Products) IMPLANT
DEVICE TORQUE (MISCELLANEOUS) IMPLANT
GLIDECATH 4FR STR (CATHETERS) IMPLANT
GLIDEWIRE STIFF .35X180X3 HYDR (WIRE) IMPLANT
NDL ENTRY 21GA 7CM ECHOTIP (NEEDLE) IMPLANT
NEEDLE ENTRY 21GA 7CM ECHOTIP (NEEDLE) ×1 IMPLANT
PACK ANGIOGRAPHY (CUSTOM PROCEDURE TRAY) ×1 IMPLANT
PANNUS RETENTION SYSTEM 2 PAD (MISCELLANEOUS) IMPLANT
SET INTRO CAPELLA COAXIAL (SET/KITS/TRAYS/PACK) IMPLANT
SHEATH ANL 5FRX45 (SHEATH) IMPLANT
SHEATH BRITE TIP 5FRX11 (SHEATH) IMPLANT
SYR MEDRAD MARK 7 150ML (SYRINGE) IMPLANT
TUBING CONTRAST HIGH PRESS 72 (TUBING) IMPLANT
WIRE J 3MM .035X145CM (WIRE) IMPLANT
WIRE SUPRACORE 190CM (WIRE) IMPLANT

## 2023-11-25 NOTE — ED Notes (Signed)
 Pt ABCs intact. RR even and unlabored. Pt in NAD. Bed in lowest locked position. Call bell in reach. Denies needs at this time.

## 2023-11-25 NOTE — ED Notes (Signed)
 Colonoscopy on Wednesday afternoon. Pt ambulating around yard and started experiencing abdominal pain suddenly. Pt reporting RLQ and R sided flank pain that is 9/10 at this time. Pt says that holding her stomach down and finding a comfortable position can help decrease the pain at times, but right now pt is not able to find that position. Pt has been nauseous but no emesis. Pt denies CP or SOB but says that abdominal pain radiates along the abdomen, starting from the RLQ. Pt denies trouble urinating but has not yet had a bowel movement. Pt has taken Tylenol @6pm  last night for pain but did not find any relief. PT is aox4. Pt ABCs intact. RR even and unlabored. Pt in NAD. Bed in lowest locked position. Call bell in reach. Denies needs at this time.   Past Medical History:  Diagnosis Date   Adenomatous colon polyp    Adrenal adenoma    Allergic genetic state    Arthritis    Chronic abdominal pain    GERD (gastroesophageal reflux disease)    Hiatal hernia    Hypertension    Sleep apnea

## 2023-11-25 NOTE — Progress Notes (Signed)
 Dr. Gilda Crease in at bedside & spoke with pt. Re: procedure. Pt. Verbalized understanding of conversation and signed consent now for visceral angiogram.

## 2023-11-25 NOTE — Consult Note (Signed)
 Hospital Consult    Reason for Consult:  Abdominal Pain  Requesting Physician:  Dr Pilar Jarvis MD MRN #:  [id number]  History of Present Illness: [name] with a past medical history of celiac artery stenosis with stent placement, chronic abdominal pain, hiatal hernia, hypertension, recent colonoscopy 2 days ago who presents to the emergency department with abdominal pain.  Patient endorses that she was fine after the colonoscopy was resting well but then the following day she developed the severe abdominal pain like she has had in the past where she needed stents.  Dors is severe diffuse and constant nonradiating pain but feels most of the pain in her right upper quadrant.  No other medical complaints at this time.  Vascular surgery was consulted to evaluate.  Past Medical History:  Diagnosis Date   Adenomatous colon polyp    Adrenal adenoma    Allergic genetic state    Arthritis    Chronic abdominal pain    GERD (gastroesophageal reflux disease)    Hiatal hernia    Hypertension    Sleep apnea     Past Surgical History:  Procedure Laterality Date   ABDOMINAL HYSTERECTOMY     CHOLECYSTECTOMY     COLONOSCOPY     COLONOSCOPY WITH PROPOFOL N/A 07/24/2018   Procedure: COLONOSCOPY WITH PROPOFOL;  Surgeon: Scot Jun, MD;  Location: [facility] ENDOSCOPY;  Service: Endoscopy;  Laterality: N/A;   COLONOSCOPY WITH PROPOFOL N/A 11/23/2023   Procedure: COLONOSCOPY WITH PROPOFOL;  Surgeon: Toledo, Boykin Nearing, MD;  Location: [facility] ENDOSCOPY;  Service: Gastroenterology;  Laterality: N/A;   ENDOBRONCHIAL ULTRASOUND Bilateral 09/30/2023   Procedure: ENDOBRONCHIAL ULTRASOUND;  Surgeon: Salena Saner, MD;  Location: [facility] ORS;  Service: Pulmonary;  Laterality: Bilateral;   ESOPHAGOGASTRODUODENOSCOPY (EGD) WITH PROPOFOL N/A 11/23/2023   Procedure: ESOPHAGOGASTRODUODENOSCOPY (EGD) WITH PROPOFOL;  Surgeon: Toledo, Boykin Nearing, MD;  Location: [facility] ENDOSCOPY;  Service: Gastroenterology;   Laterality: N/A;   FRACTURE SURGERY     HEMOSTASIS CLIP PLACEMENT  11/23/2023   Procedure: HEMOSTASIS CLIP PLACEMENT;  Surgeon: Norma Fredrickson, Boykin Nearing, MD;  Location: [facility] ENDOSCOPY;  Service: Gastroenterology;;   HOT HEMOSTASIS  11/23/2023   Procedure: HOT HEMOSTASIS (ARGON PLASMA COAGULATION/BICAP);  Surgeon: Norma Fredrickson, Boykin Nearing, MD;  Location: [facility] ENDOSCOPY;  Service: Gastroenterology;;   JOINT REPLACEMENT Right    6 years ago   POLYPECTOMY  11/23/2023   Procedure: POLYPECTOMY;  Surgeon: Norma Fredrickson, Boykin Nearing, MD;  Location: [facility] ENDOSCOPY;  Service: Gastroenterology;;   VISCERAL ANGIOGRAPHY N/A 02/05/2022   Procedure: VISCERAL ANGIOGRAPHY;  Surgeon: Renford Dills, MD;  Location: [facility] INVASIVE CV LAB;  Service: Cardiovascular;  Laterality: N/A;   VISCERAL ANGIOGRAPHY N/A 10/04/2023   Procedure: VISCERAL ANGIOGRAPHY;  Surgeon: Renford Dills, MD;  Location: [facility] INVASIVE CV LAB;  Service: Cardiovascular;  Laterality: N/A;    Allergies  Allergen Reactions   Iodinated Contrast Media Anaphylaxis    Other reaction(s): Vomiting   Ciprofloxacin Diarrhea and Nausea Only    Nausea, vomiting  Other reaction(s): Vomiting   Codeine Sulfate [Codeine] Diarrhea    Nausea, vomiting   Meloxicam    Penicillins Nausea Only   Protonix [Pantoprazole Sodium] Hives   Shellfish Allergy Swelling   Sulfa Antibiotics Rash    Prior to Admission medications   Medication Sig Start Date End Date Taking? Authorizing Provider  acetaminophen (TYLENOL) 650 MG CR tablet Take 650 mg by mouth every 8 (eight) hours as needed for pain.    [provider]  aspirin 81 MG chewable tablet Chew by mouth daily.    [provider]  atorvastatin (LIPITOR) 10 MG tablet Take 1 tablet (10 mg total) by mouth daily at 6 PM. 02/06/22 11/23/23  Gillis Santa, MD  cetirizine (ZYRTEC) 10 MG tablet Take 10 mg by mouth daily.    [provider]  colchicine 0.6 MG tablet Take by mouth. 10/14/21   [provider]  cyclobenzaprine (FLEXERIL) 5 MG tablet Take 5 mg by mouth as needed. Patient not taking: Reported on 11/23/2023 02/24/23   [provider]  DULoxetine (CYMBALTA) 30 MG capsule Take 30 mg by mouth daily.    [provider]  famotidine (PEPCID) 20 MG tablet Take 1 tablet (20 mg total) by mouth daily for 10 days. 06/26/21 11/23/23  Shaune Pollack, MD  ferrous sulfate 325 (65 FE) MG tablet Take 325 mg by mouth daily with breakfast.    [provider]  fluticasone (FLONASE) 50 MCG/ACT nasal spray Place into both nostrils daily.    [provider]  hydrALAZINE (APRESOLINE) 50 MG tablet Take 50 mg by mouth 2 (two) times daily.    [provider]  lisinopril-hydrochlorothiazide (ZESTORETIC) 10-12.5 MG tablet Take 1 tablet by mouth daily. 10/13/21   [provider]  metoprolol succinate (TOPROL-XL) 25 MG 24 hr tablet Take 25 mg by mouth daily. 10/04/22   [provider]  Multiple Vitamins-Minerals (CENTRUM ADULTS PO) Take by mouth daily.    [provider]  omeprazole (PRILOSEC) 40 MG capsule Take 40 mg by mouth 2 (two) times daily.    [provider]  PARoxetine (PAXIL) 10 MG tablet Take 1 tablet by mouth daily at 6 (six) AM. 11/06/21   [provider]    Social History   Socioeconomic History   Marital status: Married    Spouse name: Not on file   Number of children: Not on file   Years of education: Not on file   Highest education level: Not on file  Occupational History   Not on file  Tobacco Use   Smoking status: Former    Average packs/day: 0.3 packs/day for 2.0 years (0.5 ttl pk-yrs)    Types: Cigarettes    Start date: 63   Smokeless tobacco: Current    Types: Snuff  Vaping Use   Vaping status: Never Used  Substance and Sexual Activity   Alcohol use: No   Drug use: Never   Sexual activity: Not Currently  Other Topics Concern   Not on file  Social History Narrative   Not on file    Social Drivers of Health   Financial Resource Strain: Not on file  Food Insecurity: No Food Insecurity (10/04/2023)   Hunger Vital Sign    Worried About Running Out of Food in the Last Year: Never true    Ran Out of Food in the Last Year: Never true  Transportation Needs: No Transportation Needs (10/04/2023)   PRAPARE - Administrator, Civil Service (Medical): No    Lack of Transportation (Non-Medical): No  Physical Activity: Not on file  Stress: Not on file  Social Connections: Moderately Integrated (10/04/2023)   Social Connection and Isolation Panel [NHANES]    Frequency of Communication with Friends and Family: More than three times a week    Frequency of Social Gatherings with Friends and Family: Three times a week    Attends Religious Services: More than 4 times per year    Active Member of Clubs  or Organizations: Yes    Attends Banker Meetings: More than 4 times per year    Marital Status: Widowed  Intimate Partner Violence: Not At Risk (10/04/2023)   Humiliation, Afraid, Rape, and Kick questionnaire    Fear of Current or Ex-Partner: No    Emotionally Abused: No    Physically Abused: No    Sexually Abused: No     Family History  Problem Relation Age of Onset   Hypertension Mother    Stroke Mother    Epilepsy Mother    Colon polyps Sister    Breast cancer Other     ROS: Otherwise negative unless mentioned in HPI  Physical Examination  Vitals:   11/25/23 0727 11/25/23 0728  BP: (!) 157/65   Pulse: 91   Resp: 20   Temp:  (!) 100.6 F (38.1 C)  SpO2: 97%    Body mass index is 33.52 kg/m.  General:  WDWN in NAD Gait: Not observed HENT: WNL, normocephalic Pulmonary: normal non-labored breathing, without Rales, rhonchi,  wheezing Cardiac: regular, without  Murmurs, rubs or gallops; without carotid bruits Abdomen: Positive bowel sounds throughout,  soft, Positive tenderness Right upper quadrant/ND, no masses Skin: without  rashes Vascular Exam/Pulses: Palpable Pulses throughout. Extremities: without ischemic changes, without Gangrene , without cellulitis; without open wounds;  Musculoskeletal: no muscle wasting or atrophy  Neurologic: A&O X 3;  No focal weakness or paresthesias are detected; speech is fluent/normal Psychiatric:  The pt has Normal affect. Lymph:  Unremarkable  CBC    Component Value Date/Time   WBC 15.0 (H) 11/25/2023 0039   RBC 4.65 11/25/2023 0039   HGB 11.7 (L) 11/25/2023 0039   HGB 10.8 (L) 05/08/2014 0151   HCT 36.9 11/25/2023 0039   HCT 35.5 05/08/2014 0151   PLT 322 11/25/2023 0039   PLT 308 05/08/2014 0151   MCV 79.4 (L) 11/25/2023 0039   MCV 76 (L) 05/08/2014 0151   MCH 25.2 (L) 11/25/2023 0039   MCHC 31.7 11/25/2023 0039   RDW 15.5 11/25/2023 0039   RDW 16.3 (H) 05/08/2014 0151   LYMPHSABS 1.7 02/04/2022 0637   LYMPHSABS 3.4 05/08/2014 0151   MONOABS 0.5 02/04/2022 0637   MONOABS 0.5 05/08/2014 0151   EOSABS 0.1 02/04/2022 0637   EOSABS 0.1 05/08/2014 0151   BASOSABS 0.1 02/04/2022 0637   BASOSABS 0.1 05/08/2014 0151    BMET    Component Value Date/Time   NA 139 11/25/2023 0039   NA 138 05/08/2014 0151   K 3.3 (L) 11/25/2023 0039   K 3.8 05/08/2014 0151   CL 105 11/25/2023 0039   CL 106 05/08/2014 0151   CO2 22 11/25/2023 0039   CO2 23 05/08/2014 0151   GLUCOSE 134 (H) 11/25/2023 0039   GLUCOSE 97 05/08/2014 0151   BUN 17 11/25/2023 0039   BUN 16 05/08/2014 0151   CREATININE 1.07 (H) 11/25/2023 0039   CREATININE 1.25 05/08/2014 0151   CALCIUM 9.8 11/25/2023 0039   CALCIUM 8.8 05/08/2014 0151   GFRNONAA 56 (L) 11/25/2023 0039   GFRNONAA 46 (L) 05/08/2014 0151   GFRAA 56 (L) 04/23/2018 0901   GFRAA 54 (L) 05/08/2014 0151    COAGS: Lab Results  Component Value Date   INR 1.0 10/03/2023     Non-Invasive Vascular Imaging:   EXAM:11/25/23 CT ABDOMEN AND PELVIS WITHOUT CONTRAST   TECHNIQUE: Multidetector CT imaging of the abdomen and pelvis was  performed following the standard protocol without IV contrast.   RADIATION DOSE REDUCTION:  This exam was performed according to the departmental dose-optimization program which includes automated exposure control, adjustment of the mA and/or kV according to patient size and/or use of iterative reconstruction technique.   COMPARISON:  10/03/2023   FINDINGS: Lower chest: No acute abnormality.   Hepatobiliary: No focal liver abnormality is seen. Status post cholecystectomy. No biliary dilatation.   Pancreas: Unremarkable. No pancreatic ductal dilatation or surrounding inflammatory changes.   Spleen: Normal in size without focal abnormality.   Adrenals/Urinary Tract: Adrenal glands show the right adrenal gland to be within normal limits. 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 No follow-up is recommended. The kidneys are well visualize without renal calculi or obstructive change. The bladder is decompressed.   Stomach/Bowel: No obstructive or inflammatory changes of the colon are seen. The appendix is within normal limits. No inflammatory changes to suggest appendicitis are noted. Small bowel and stomach are unremarkable.   Vascular/Lymphatic: Aortic atherosclerosis. No enlarged abdominal or pelvic lymph nodes.   Reproductive: Status post hysterectomy. No adnexal masses.   Other: Free fluid is noted within the pelvis   Musculoskeletal: No acute or significant osseous findings.   IMPRESSION: Normal-appearing appendix.   Mild free fluid within the pelvis.  Statin:  Yes.   Beta Blocker:  Yes.   Aspirin:  Yes.   ACEI:  Yes.   ARB:  No. CCB use:  No Other antiplatelets/anticoagulants:  No.    ASSESSMENT/PLAN: [name] who presents to [facility] emergency department with severe abdominal pain after an upper endoscopy and colonoscopy 2 days ago.  Patient endorses the pain can be severe at times and  constant but nonradiating.  Patient endorses she has been taking daily aspirin.  Vascular surgery plans on taking the patient to the vascular lab on 11/25/2023 for a visceral angiogram due to elevated lactic acid and her history of mesenteric atherosclerosis with stent placement.  I had a long detailed discussion this morning with the patient in the emergency room at the bedside in which we discussed the procedure, benefits, risk, and complications.  Patient verbalizes her understanding and wishes to proceed at this time.  I answered all the patient's questions this morning.  Patient has been n.p.o. since midnight last night.   -I discussed the case in detail with Dr. Levora Dredge MD and he agrees with the plan.   Marcie Bal [facility] 11/25/2023 7:55 AM

## 2023-11-25 NOTE — H&P (Signed)
 History and Physical    Teresa Sparks ZOX:[id number] DOB: [birth year] DOA: 11/25/2023  PCP: Jerl Mina, MD  Patient coming from: home  I have personally briefly reviewed patient's old medical records in [facility]  Chief Complaint: worse than usual abdominal pain  HPI: Teresa Sparks is a 73 y.o. female with medical history significant of celiac artery stenosis with stent placement, chronic abdominal pain who presented with worsening severe abdominal pain.  Pt presented to ED complaining of right-sided abdominal pain since yesterday afternoon.  Had nausea but no vomiting.  Pt had EGD and colonoscopy on 11/23/23 due to issues w/ epigastric pain over the past few months and chronic constipation.  EGD showed gastritis, colonoscopy only pos for some polyps.  Pt has a history of celiac artery stenosis with stent placement, and reported the abdominal pain felt like she had had in the past when she needed stents.    ED Course: initial vitals:  temp 99.8, pulse 87, BP 139/74, RR 18, sating 99% on room air.  Labs notable for WBC 15, lactic acid 2.0, resp panel neg, CT a/p neg for acute finding.  Due to concern for celiac artery stenosis, ED provided consulted vascular surgery who performed visceral angiography which had no acute finding.  ED provider had originally considered discharging pt home if angio is neg, however, due to elevated WBC, still unclear etiology of pt's abdominal pain, vascular surgery requested admission for overnight observation.   Assessment/Plan  Abdominal pain --Had EGD and colonoscopy 2 days prior which was only notable for gastritis.  CT a/p today showed no finding to explain the abdominal pain.  Pt reported abdominal pain improved with a dose of IV morphine this morning. --Monitor  SIRS --WBC 15, and pt developed fever of 100.6 this morning.  Currently no source of infection. --blood cx x2 --procal and trend WBC --hold abx for now  History of celiac  artery stenosis with stent placement --visceral angiography today with no acute finding --cont ASA --resume statin after discharge  Gastritis --takes omeprazole at home, and has protonix listed as allergy. --resume home PPI after discharge.  HTN --cont hydralazine and Toprol   DVT prophylaxis: Lovenox SQ Code Status: Full code  Family Communication:   Disposition Plan: home  Consults called: Vascular surgery Level of care: Med-Surg   Review of Systems: As per HPI otherwise complete review of systems negative.   Past Medical History:  Diagnosis Date   Adenomatous colon polyp    Adrenal adenoma    Allergic genetic state    Arthritis    Chronic abdominal pain    GERD (gastroesophageal reflux disease)    Hiatal hernia    Hypertension    Sleep apnea     Past Surgical History:  Procedure Laterality Date   ABDOMINAL HYSTERECTOMY     CHOLECYSTECTOMY     COLONOSCOPY     COLONOSCOPY WITH PROPOFOL N/A 07/24/2018   Procedure: COLONOSCOPY WITH PROPOFOL;  Surgeon: Scot Jun, MD;  Location: [facility] ENDOSCOPY;  Service: Endoscopy;  Laterality: N/A;   COLONOSCOPY WITH PROPOFOL N/A 11/23/2023   Procedure: COLONOSCOPY WITH PROPOFOL;  Surgeon: Toledo, Boykin Nearing, MD;  Location: [facility] ENDOSCOPY;  Service: Gastroenterology;  Laterality: N/A;   ENDOBRONCHIAL ULTRASOUND Bilateral 09/30/2023   Procedure: ENDOBRONCHIAL ULTRASOUND;  Surgeon: Salena Saner, MD;  Location: [facility] ORS;  Service: Pulmonary;  Laterality: Bilateral;   ESOPHAGOGASTRODUODENOSCOPY (EGD) WITH PROPOFOL N/A 11/23/2023   Procedure: ESOPHAGOGASTRODUODENOSCOPY (EGD) WITH PROPOFOL;  Surgeon: Norma Fredrickson, Boykin Nearing, MD;  Location: [facility] ENDOSCOPY;  Service: Gastroenterology;  Laterality: N/A;   FRACTURE SURGERY     HEMOSTASIS CLIP PLACEMENT  11/23/2023   Procedure: HEMOSTASIS CLIP PLACEMENT;  Surgeon: Norma Fredrickson, Boykin Nearing, MD;  Location: [facility] ENDOSCOPY;  Service: Gastroenterology;;   HOT HEMOSTASIS  11/23/2023   Procedure: HOT  HEMOSTASIS (ARGON PLASMA COAGULATION/BICAP);  Surgeon: Norma Fredrickson, Boykin Nearing, MD;  Location: [facility] ENDOSCOPY;  Service: Gastroenterology;;   JOINT REPLACEMENT Right    6 years ago   POLYPECTOMY  11/23/2023   Procedure: POLYPECTOMY;  Surgeon: Norma Fredrickson, Boykin Nearing, MD;  Location: [facility] ENDOSCOPY;  Service: Gastroenterology;;   VISCERAL ANGIOGRAPHY N/A 02/05/2022   Procedure: VISCERAL ANGIOGRAPHY;  Surgeon: Renford Dills, MD;  Location: [facility] INVASIVE CV LAB;  Service: Cardiovascular;  Laterality: N/A;   VISCERAL ANGIOGRAPHY N/A 10/04/2023   Procedure: VISCERAL ANGIOGRAPHY;  Surgeon: Renford Dills, MD;  Location: [facility] INVASIVE CV LAB;  Service: Cardiovascular;  Laterality: N/A;     reports that she has quit smoking. Her smoking use included cigarettes. She started smoking about 54 years ago. She has a 0.5 pack-year smoking history. Her smokeless tobacco use includes snuff. She reports that she does not drink alcohol and does not use drugs.  Allergies  Allergen Reactions   Iodinated Contrast Media Anaphylaxis    Other reaction(s): Vomiting   Ciprofloxacin Diarrhea and Nausea Only    Nausea, vomiting  Other reaction(s): Vomiting   Codeine Sulfate [Codeine] Diarrhea    Nausea, vomiting   Meloxicam    Penicillins Nausea Only   Protonix [Pantoprazole Sodium] Hives   Shellfish Allergy Swelling   Sulfa Antibiotics Rash    Family History  Problem Relation Age of Onset   Hypertension Mother    Stroke Mother    Epilepsy Mother    Colon polyps Sister    Breast cancer Other     Prior to Admission medications   Medication Sig Start Date End Date Taking? Authorizing Provider  acetaminophen (TYLENOL) 650 MG CR tablet Take 650 mg by mouth every 8 (eight) hours as needed for pain.    [provider]  aspirin 81 MG chewable tablet Chew by mouth daily.    [provider]  atorvastatin (LIPITOR) 10 MG tablet Take 1 tablet (10 mg total) by mouth daily at 6 PM. 02/06/22 11/23/23  Gillis Santa, MD  cetirizine (ZYRTEC) 10 MG tablet Take 10 mg by mouth daily.    [provider]  colchicine 0.6 MG tablet Take by mouth. 10/14/21   [provider]  cyclobenzaprine (FLEXERIL) 5 MG tablet Take 5 mg by mouth as needed. Patient not taking: Reported on 11/23/2023 02/24/23   [provider]  DULoxetine (CYMBALTA) 30 MG capsule Take 30 mg by mouth daily.    [provider]  famotidine (PEPCID) 20 MG tablet Take 1 tablet (20 mg total) by mouth daily for 10 days. 06/26/21 11/23/23  Shaune Pollack, MD  ferrous sulfate 325 (65 FE) MG tablet Take 325 mg by mouth daily with breakfast.    [provider]  fluticasone (FLONASE) 50 MCG/ACT nasal spray Place into both nostrils daily.    [provider]  hydrALAZINE (APRESOLINE) 50 MG tablet Take 50 mg by mouth 2 (two) times daily.    [provider]  lisinopril-hydrochlorothiazide (ZESTORETIC) 10-12.5 MG tablet Take 1 tablet by mouth daily. 10/13/21   [provider]  metoprolol succinate (TOPROL-XL) 25 MG 24 hr tablet Take 25 mg by mouth daily. 10/04/22   [provider]  Multiple Vitamins-Minerals (CENTRUM ADULTS PO) Take by mouth daily.    [provider]  omeprazole (PRILOSEC) 40 MG capsule Take 40 mg by mouth 2 (two) times daily.    [provider]  PARoxetine (PAXIL) 10 MG tablet Take 1 tablet by mouth daily at 6 (six) AM. 11/06/21   [provider]    Physical Exam: Vitals:   11/25/23 1745 11/25/23 1801 11/25/23 1814 11/25/23 1815  BP: (!) 148/56 (!) 166/68 (!) 184/78 (!) 184/59  Pulse: 97 98 97 98  Resp: 18 18 15 18   Temp:      TempSrc:      SpO2: 94% 94% 95% 93%  Weight:      Height:        Constitutional: NAD, AAOx3 HEENT: conjunctivae and lids normal, EOMI CV: No cyanosis.   RESP: normal respiratory effort Neuro: II - XII grossly intact.   Psych: Normal mood and affect.  Appropriate judgement and reason   Labs on Admission:  I have personally reviewed labs and imaging studies  Time spent: 60 minutes  Darlin Priestly MD [facility]  If 7PM-7AM, please contact night-coverage 11/25/2023, 6:22 PM

## 2023-11-25 NOTE — ED Provider Notes (Signed)
 [facility] Provider Note    Event Date/Time   First MD Initiated Contact with Patient 11/25/23 [phone]     (approximate)   History   Flank Pain and Abdominal Pain   HPI  Teresa Sparks is a 73 y.o. female   Past medical history of celiac artery stenosis with stent, chronic abdominal pain, hiatal hernia, hypertension, recent colonoscopy 2 days ago presents the emergency department with abdominal pain.  After colonoscopy was resting well in the next day as she was walking she developed abdominal pain.  It is severe diffuse and constant.  Nonradiating.  She denies urinary symptoms.  No other acute medical complaints.   External Medical Documents Reviewed: Hospitalization in January 2025 "Patient presents with several weeks intermittent epigastric pain worse with eating. U/s in vascular office on 12/24 showed possible celiac artery stenosis. She has a celiac artery stent. Labs here and CT of abdomen/pelvis un-revealing. Taken for angiography on 1/7 which revealed patent vasculature "      Physical Exam   Triage Vital Signs: ED Triage Vitals  Encounter Vitals Group     BP 11/25/23 0016 139/74     Systolic BP Percentile --      Diastolic BP Percentile --      Pulse Rate 11/25/23 0016 87     Resp 11/25/23 0016 18     Temp 11/25/23 0016 99.8 F (37.7 C)     Temp Source 11/25/23 0016 Oral     SpO2 11/25/23 0016 99 %     Weight 11/25/23 0016 227 lb (103 kg)     Height 11/25/23 0016 5\' 9"  (1.753 m)     Head Circumference --      Peak Flow --      Pain Score 11/25/23 0028 10     Pain Loc --      Pain Education --      Exclude from Growth Chart --     Most recent vital signs: Vitals:   11/25/23 0016 11/25/23 0449  BP: 139/74 (!) 165/62  Pulse: 87 91  Resp: 18 18  Temp: 99.8 F (37.7 C) 100.3 F (37.9 C)  SpO2: 99% 97%    General: Awake, no distress.  CV:  Good peripheral perfusion.  Resp:  Normal effort.  Abd:  No distention.   Other:  She has diffuse tenderness to palpation with guarding.  Her temperature is 100.3.  Hypertensive otherwise vital signs normal.   ED Results / Procedures / Treatments   Labs (all labs ordered are listed, but only abnormal results are displayed) Labs Reviewed  COMPREHENSIVE METABOLIC PANEL - Abnormal; Notable for the following components:      Result Value   Potassium 3.3 (*)    Glucose, Bld 134 (*)    Creatinine, Ser 1.07 (*)    GFR, Estimated 56 (*)    All other components within normal limits  CBC - Abnormal; Notable for the following components:   WBC 15.0 (*)    Hemoglobin 11.7 (*)    MCV 79.4 (*)    MCH 25.2 (*)    All other components within normal limits  URINALYSIS, ROUTINE W REFLEX MICROSCOPIC - Abnormal; Notable for the following components:   Color, Urine YELLOW (*)    APPearance CLEAR (*)    All other components within normal limits  LIPASE, BLOOD  LACTIC ACID, PLASMA  LACTIC ACID, PLASMA     I ordered and reviewed the above labs they are notable for  leukocytosis to 15.   RADIOLOGY I independently reviewed and interpreted CT scan of the abdomen pelvis to see no obvious inflammatory changes or obstructive changes I also reviewed radiologist's formal read.   PROCEDURES:  Critical Care performed: No  Procedures   MEDICATIONS ORDERED IN ED: Medications  acetaminophen (TYLENOL) tablet 1,000 mg (1,000 mg Oral Given 11/25/23 0552)  morphine (PF) 4 MG/ML injection 4 mg (4 mg Intravenous Given 11/25/23 0553)  sodium chloride 0.9 % bolus 1,000 mL (1,000 mLs Intravenous New Bag/Given 11/25/23 0554)  droperidol (INAPSINE) 2.5 MG/ML injection 2.5 mg (2.5 mg Intravenous Given 11/25/23 0553)    IMPRESSION / MDM / ASSESSMENT AND PLAN / ED COURSE  I reviewed the triage vital signs and the nursing notes.                                Patient's presentation is most consistent with acute presentation with potential threat to life or bodily  function.  Differential diagnosis includes, but is not limited to, mesenteric ischemia, perforation after colonoscopy, intra-abdominal infection like diverticulitis, cholecystitis, appendicitis   The patient is on the cardiac monitor to evaluate for evidence of arrhythmia and/or significant heart rate changes.  MDM:    Diffuse abdominal pain, with a known celiac artery stenosis with stenting, recent colonoscopy with broad differential diagnosis including perforation from colonoscopy, restenosis and bowel ischemia, intra-abdominal infection.  CT scan showed no remarkable findings that she has leukocytosis otherwise lab analysis unremarkable.  Will check a lactic acid to further risk ratified for potential ischemic gut, give fluids, give pain control.  Disposition pending the results of testing as above, patient reevaluation after pain medications.       FINAL CLINICAL IMPRESSION(S) / ED DIAGNOSES   Final diagnoses:  Generalized abdominal pain     Rx / DC Orders   ED Discharge Orders     None        Note:  This document was prepared using Dragon voice recognition software and may include unintentional dictation errors.    Pilar Jarvis, MD 11/25/23 [phone]

## 2023-11-25 NOTE — Progress Notes (Signed)
 PHARMACIST - PHYSICIAN COMMUNICATION  CONCERNING:  Enoxaparin (Lovenox) for DVT Prophylaxis    RECOMMENDATION: Patient was prescribed enoxaprin 40mg  q24 hours for VTE prophylaxis.   Filed Weights   11/25/23 0016  Weight: 103 kg (227 lb)    Body mass index is 33.52 kg/m.  Estimated Creatinine Clearance: 62.5 mL/min (A) (by C-G formula based on SCr of 1.07 mg/dL (H)).   Based on [facility] policy patient is candidate for enoxaparin 0.5mg /kg TBW SQ every 24 hours based on BMI being >30.  DESCRIPTION: Pharmacy has adjusted enoxaparin dose per [facility] policy.  Patient is now receiving enoxaparin 52.5 mg every 24 hours    Foye Deer, PharmD Clinical Pharmacist  11/25/2023 7:38 PM

## 2023-11-25 NOTE — Op Note (Signed)
  [facility]  Percutaneous Study/Intervention Procedural Note   Date of Surgery:11/25/2023 Surgeon:Delise Simenson  Pre-operative Diagnosis: Abdominal pain uncertain etiology associated with an elevated lactate and white blood cell count, possible mesenteric ischemia  Post-operative diagnosis:  Abdominal pain uncertain etiology associated with an elevated lactate and white blood cell count no evidence of mesenteric ischemia  Procedure(s) Performed:  1.  Abdominal aortogram AP and lateral views  2.  Selective injection of the celiac axis first order catheter placement  3.  Selective injection of the superior mesenteric artery first order catheter placement                Anesthesia: Conscious sedation was administered under my direct supervision by the interventional radiology RN. IV Versed plus fentanyl were utilized. Continuous ECG, pulse oximetry and blood pressure was monitored throughout the entire procedure.  Conscious sedation was for a total of 49 minutes.  Sheath: 5 [place]  Contrast: 75 cc  Fluoroscopy Time: 7.6 minutes  Indications:  Teresa Sparks presented with abdominal pain of uncertain etiology associated with an elevated lactate as well as an elevated white blood cell count.  CT scan is nondiagnostic but is without contrast secondary to a contrast allergy.  There is concerned that this represents ischemic colitis and has asked [place] to evaluate the patient for mesenteric ischemia. The rest and benefits of angiography with possible intervention have been reviewed, all questions were answered and the patient has agreed to proceed with angiography and possible intervention.  Procedure:  Teresa Sparks is a 73 y.o. y.o. female who was identified and appropriate procedural time out was performed.  The patient was then placed supine on the table and prepped and draped in the usual sterile fashion.    Ultrasound was used to evaluate the right common  femoral artery.  It was pulsatile and echolucent indicating it was patent .  A micropuncture needle was used to access the right common femoral artery under direct ultrasound guidance and an image was recorded for the permanent record.  A 0.035 J wire was advanced without resistance and a 5Fr sheath was placed.    Pigtail catheter was positioned to the level of T10 and AP view of the aorta was obtained. This localized the SMA and celiac artery origins. The pigtail catheter was repositioned the detector was then maneuvered into a lateral position.  Bolus injection contrast was utilized to demonstrate the origins of the celiac and the SMA. Working in the lateral projection pigtail catheter was exchanged for VS1 catheter over a Glidewire and the SMA was engaged with the catheter, this represented first order catheter placement. The detector was repositioned to the AP and contrast was used to evaluate the entire superior mesenteric artery in this view. After review these images the the VS1 catheter was repositioned to an infrarenal location and in the RAO projection hand injection contrast was used the localize the origin of the IMA. Using the VS-1 catheter the IMA was engaged again representing a first order catheter placement and hand injection contrast was used to demonstrate the distal inferior mesenteric artery circulation.  After review these images the catheters removed over wire oblique view of the right groin is obtained and a minx device deployed without difficulty there are no immediate complications.  Findings:   Aortogram:  The abdominal aorta is opacified with a bolus injection contrast. The origin of the celiac appears to be at the level of T11 with the SMA just below. In the  lateral projection there is no evidence of hemodynamically significant plaque formation or stenosis of the origin of the SMA and the celiac artery demonstrates the previously placed stent which is widely patent. With the  detector in the AP projection the SMA and the celiac artery is evaluated and demonstrates normal flow throughout it's course without any distal lesions identified.  No evidence for vasculitis or distal emboli.    Common femoral is patent but there are eccentric lesions now present and I did not feel it was a suitable situation for closure with a Starclose based on the location of the access arteriotomy.  The sheath was pulled and manual pressure was held.  Summary: Widely patent arterial system with little evidence for atherosclerotic changes there is no hemodynamically significant lesions noted throughout the visceral arteries.    Disposition: Patient was taken to the recovery room in stable condition having tolerated the procedure well.  Teresa Sparks Teresa Sparks 11/25/2023,5:25 PM

## 2023-11-25 NOTE — ED Triage Notes (Signed)
 Pt to ED via POV c/o right sided flank pain that radiates to RLQ. Feels like cramping. Started yesterday around 4pm. Pt had colonoscopy done on Wednesday. Denies CP, SHOB, dizziness, fevers

## 2023-11-25 NOTE — ED Provider Notes (Addendum)
 Procedures     ----------------------------------------- 9:51 AM on 11/25/2023 ----------------------------------------- Noncontrast CT unremarkable.  Lactate elevated at 2.0.  Discussed with vascular Dr. Gilda Crease, will plan for angiography to ensure patency of her celiac artery.  Repeat lactate is improved after IV fluids.  ----------------------------------------- 10:35 AM on 11/25/2023 ----------------------------------------- Patient currently undergoing premedication protocol for potential IV contrast allergy prior to angiography.  Currently scheduled for vascular angio procedure around 5:00 PM after which she will return to the ED postrecovery..  If angio is negative, she may be able to be discharged home so we will retain as an ED patient pending the outcome of that study.  Remain NPO.       Sharman Cheek, MD 11/25/23 3086    Sharman Cheek, MD 11/25/23 1037

## 2023-11-26 DIAGNOSIS — R1010 Upper abdominal pain, unspecified: Secondary | ICD-10-CM | POA: Diagnosis not present

## 2023-11-26 DIAGNOSIS — R109 Unspecified abdominal pain: Secondary | ICD-10-CM | POA: Diagnosis not present

## 2023-11-26 LAB — BASIC METABOLIC PANEL
Anion gap: 8 (ref 5–15)
BUN: 12 mg/dL (ref 8–23)
CO2: 22 mmol/L (ref 22–32)
Calcium: 9.4 mg/dL (ref 8.9–10.3)
Chloride: 108 mmol/L (ref 98–111)
Creatinine, Ser: 0.75 mg/dL (ref 0.44–1.00)
GFR, Estimated: >60 mL/min (ref >60)
Glucose, Bld: 128 mg/dL — ABNORMAL HIGH (ref 70–99)
Potassium: 3.3 mmol/L — ABNORMAL LOW (ref 3.5–5.1)
Sodium: 138 mmol/L (ref 135–145)

## 2023-11-26 LAB — MAGNESIUM: Magnesium: 2.1 mg/dL (ref 1.7–2.4)

## 2023-11-26 LAB — CBC
HCT: 31.3 % — ABNORMAL LOW (ref 36.0–46.0)
Hemoglobin: 9.9 g/dL — ABNORMAL LOW (ref 12.0–15.0)
MCH: 24.3 pg — ABNORMAL LOW (ref 26.0–34.0)
MCHC: 31.6 g/dL (ref 30.0–36.0)
MCV: 76.9 fL — ABNORMAL LOW (ref 80.0–100.0)
Platelets: 259 10*3/uL (ref 150–400)
RBC: 4.07 MIL/uL (ref 3.87–5.11)
RDW: 15.1 % (ref 11.5–15.5)
WBC: 12.2 10*3/uL — ABNORMAL HIGH (ref 4.0–10.5)
nRBC: 0 % (ref 0.0–0.2)

## 2023-11-26 LAB — HIV ANTIBODY (ROUTINE TESTING W REFLEX): HIV Screen 4th Generation wRfx: NONREACTIVE

## 2023-11-26 MED ORDER — POTASSIUM CHLORIDE CRYS ER 20 MEQ PO TBCR: 40.0000 meq | EXTENDED_RELEASE_TABLET | Freq: Once | ORAL | Status: DC

## 2023-11-26 NOTE — Discharge Summary (Signed)
 Physician Discharge Summary   Teresa Sparks  female DOB: 05/17/1951  ZOX:[id number]  PCP: Jerl Mina, MD  Admit date: 11/25/2023 Discharge date: 11/26/2023  Admitted From: home Disposition:  home CODE STATUS: Full code  Discharge Instructions     Diet - low sodium heart healthy   Complete by: As directed       Hospital Course:  For full details, please see H&P, progress notes, consult notes and ancillary notes.  Briefly,   Teresa Sparks is a 73 y.o. female with medical history significant of celiac artery stenosis with stent placement, chronic abdominal pain who presented with worsening severe abdominal pain.   Pt had EGD and colonoscopy on 11/23/23 due to issues w/ epigastric pain over the past few months and chronic constipation.  EGD showed gastritis, colonoscopy only pos for some polyps.  Pt has a history of celiac artery stenosis with stent placement, and reported the abdominal pain felt like she had had in the past when she needed stents.  ED provider therefore consulted Vascular surgery for visceral angiography, which had no acute finding.  Admission requested for observation.  Abdominal pain, resolved --Had EGD and colonoscopy 2 days prior which was only notable for gastritis.  CT a/p today showed no finding to explain the abdominal pain.  Pt reported abdominal pain improved with a dose of IV morphine given in the ED.  ED provider consulted Vascular surgery for visceral angiography, which showed Widely patent arterial system.  Pt was observed overnight and no abdominal pain the next day so was discharged.  Gastritis --takes omeprazole at home, and has protonix listed as allergy. --resume home PPI after discharge.  History of celiac artery stenosis with stent placement --visceral angiography showed Widely patent arterial system --cont ASA --resume statin after discharge  SIRS --WBC 15, and pt developed fever of 100.6 morning of presentation.  Currently  no source of infection.  Procal neg.   --blood cx x2 neg growth.  WBC trended down the next day.  Did not start abx.   HTN --cont home hydralazine and Toprol --home Lisinopril-hydrochlorothiazide resumed after discharge.   Unless noted above, medications under "STOP" list are ones pt was not taking PTA.  Discharge Diagnoses:  Principal Problem:   Abdominal pain   30 Day Unplanned Readmission Risk Score    Flowsheet Row ED to Hosp-Admission (Discharged) from 10/03/2023 in [facility]  30 Day Unplanned Readmission Risk Score (%) 10.59 Filed at 10/05/2023 1200       This score is the patient's risk of an unplanned readmission within 30 days of being discharged (0 -100%). The score is based on dignosis, age, lab data, medications, orders, and past utilization.   Low:  0-14.9   Medium: 15-21.9   High: 22-29.9   Extreme: 30 and above         Discharge Instructions:  Allergies as of 11/26/2023       Reactions   Iodinated Contrast Media Anaphylaxis   Other reaction(s): Vomiting   Ciprofloxacin Diarrhea, Nausea Only   Nausea, vomiting  Other reaction(s): Vomiting   Codeine Sulfate [codeine] Diarrhea   Nausea, vomiting   Meloxicam    Penicillins Nausea Only   Protonix [pantoprazole Sodium] Hives   Shellfish Allergy Swelling   Sulfa Antibiotics Rash        Medication List     STOP taking these medications    cyclobenzaprine 5 MG tablet Commonly known as: FLEXERIL  TAKE these medications    acetaminophen 650 MG CR tablet Commonly known as: TYLENOL Take 650 mg by mouth every 8 (eight) hours as needed for pain.   aspirin 81 MG chewable tablet Chew by mouth daily.   atorvastatin 10 MG tablet Commonly known as: LIPITOR Take 1 tablet (10 mg total) by mouth daily at 6 PM.   CENTRUM ADULTS PO Take by mouth daily.   cetirizine 10 MG tablet Commonly known as: ZYRTEC Take 10 mg by mouth daily.   colchicine 0.6 MG tablet Take by  mouth.   DULoxetine 30 MG capsule Commonly known as: CYMBALTA Take 30 mg by mouth daily.   famotidine 20 MG tablet Commonly known as: PEPCID Take 1 tablet (20 mg total) by mouth daily for 10 days.   ferrous sulfate 325 (65 FE) MG tablet Take 325 mg by mouth daily with breakfast.   fluticasone 50 MCG/ACT nasal spray Commonly known as: FLONASE Place into both nostrils daily.   hydrALAZINE 50 MG tablet Commonly known as: APRESOLINE Take 50 mg by mouth 2 (two) times daily.   lisinopril-hydrochlorothiazide 10-12.5 MG tablet Commonly known as: ZESTORETIC Take 1 tablet by mouth daily.   metoprolol succinate 25 MG 24 hr tablet Commonly known as: TOPROL-XL Take 25 mg by mouth daily.   omeprazole 40 MG capsule Commonly known as: PRILOSEC Take 40 mg by mouth 2 (two) times daily.   PARoxetine 10 MG tablet Commonly known as: PAXIL Take 1 tablet by mouth daily at 6 (six) AM.         Follow-up Information     Schnier, Latina Craver, MD Follow up in 1 month(s).   Specialties: Vascular Surgery, Cardiology, Radiology, Vascular Surgery Why: follow up after procedure with ABI's and bilateral femoral artery ultrasounds Contact information: [address] [place] [address] [phone]         Jerl Mina, MD Follow up in 1 week(s).   Specialty: Family Medicine Contact information: [address] [facility] [place] [address] [phone]                 Allergies  Allergen Reactions   Iodinated Contrast Media Anaphylaxis    Other reaction(s): Vomiting   Ciprofloxacin Diarrhea and Nausea Only    Nausea, vomiting  Other reaction(s): Vomiting   Codeine Sulfate [Codeine] Diarrhea    Nausea, vomiting   Meloxicam    Penicillins Nausea Only   Protonix [Pantoprazole Sodium] Hives   Shellfish Allergy Swelling   Sulfa Antibiotics Rash     The results of significant diagnostics from this hospitalization (including imaging,  microbiology, ancillary and laboratory) are listed below for reference.   Consultations:   Procedures/Studies: PERIPHERAL VASCULAR CATHETERIZATION Result Date: 11/25/2023 See surgical note for result.  CT ABDOMEN PELVIS WO CONTRAST Result Date: 11/25/2023 CLINICAL DATA:  Right-sided flank pain for 2 days, initial encounter EXAM: CT ABDOMEN AND PELVIS WITHOUT CONTRAST TECHNIQUE: Multidetector CT imaging of the abdomen and pelvis was performed following the standard protocol without IV contrast. RADIATION DOSE REDUCTION: This exam was performed according to the departmental dose-optimization program which includes automated exposure control, adjustment of the mA and/or kV according to patient size and/or use of iterative reconstruction technique. COMPARISON:  10/03/2023 FINDINGS: Lower chest: No acute abnormality. Hepatobiliary: No focal liver abnormality is seen. Status post cholecystectomy. No biliary dilatation. Pancreas: Unremarkable. No pancreatic ductal dilatation or surrounding inflammatory changes. Spleen: Normal in size without focal abnormality. Adrenals/Urinary Tract: Adrenal glands show the right  adrenal gland to be within normal limits. 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 No follow-up is recommended. The kidneys are well visualize without renal calculi or obstructive change. The bladder is decompressed. Stomach/Bowel: No obstructive or inflammatory changes of the colon are seen. The appendix is within normal limits. No inflammatory changes to suggest appendicitis are noted. Small bowel and stomach are unremarkable. Vascular/Lymphatic: Aortic atherosclerosis. No enlarged abdominal or pelvic lymph nodes. Reproductive: Status post hysterectomy. No adnexal masses. Other: Free fluid is noted within the pelvis Musculoskeletal: No acute or significant osseous findings. IMPRESSION: Normal-appearing appendix. Mild free fluid within the pelvis.  Electronically Signed   By: Alcide Clever M.D.   On: 11/25/2023 02:58      Labs: BNP (last 3 results) No results for input(s): "BNP" in the last 8760 hours. Basic Metabolic Panel: Recent Labs  Lab 11/25/23 0039 11/26/23 0540  NA 139 138  K 3.3* 3.3*  CL 105 108  CO2 22 22  GLUCOSE 134* 128*  BUN 17 12  CREATININE 1.07* 0.75  CALCIUM 9.8 9.4  MG  --  2.1   Liver Function Tests: Recent Labs  Lab 11/25/23 0039  AST 24  ALT 20  ALKPHOS 48  BILITOT 0.6  PROT 7.2  ALBUMIN 4.1   Recent Labs  Lab 11/25/23 0039  LIPASE 23   No results for input(s): "AMMONIA" in the last 168 hours. CBC: Recent Labs  Lab 11/25/23 0039 11/26/23 0540  WBC 15.0* 12.2*  HGB 11.7* 9.9*  HCT 36.9 31.3*  MCV 79.4* 76.9*  PLT 322 259   Cardiac Enzymes: No results for input(s): "CKTOTAL", "CKMB", "CKMBINDEX", "TROPONINI" in the last 168 hours. BNP: Invalid input(s): "POCBNP" CBG: No results for input(s): "GLUCAP" in the last 168 hours. D-Dimer No results for input(s): "DDIMER" in the last 72 hours. Hgb A1c No results for input(s): "HGBA1C" in the last 72 hours. Lipid Profile No results for input(s): "CHOL", "HDL", "LDLCALC", "TRIG", "CHOLHDL", "LDLDIRECT" in the last 72 hours. Thyroid function studies No results for input(s): "TSH", "T4TOTAL", "T3FREE", "THYROIDAB" in the last 72 hours.  Invalid input(s): "FREET3" Anemia work up No results for input(s): "VITAMINB12", "FOLATE", "FERRITIN", "TIBC", "IRON", "RETICCTPCT" in the last 72 hours. Urinalysis    Component Value Date/Time   COLORURINE YELLOW (A) 11/25/2023 0449   APPEARANCEUR CLEAR (A) 11/25/2023 0449   APPEARANCEUR Clear 05/08/2014 0316   LABSPEC 1.021 11/25/2023 0449   LABSPEC 1.010 05/08/2014 0316   PHURINE 5.0 11/25/2023 0449   GLUCOSEU NEGATIVE 11/25/2023 0449   GLUCOSEU Negative 05/08/2014 0316   HGBUR NEGATIVE 11/25/2023 0449   BILIRUBINUR NEGATIVE 11/25/2023 0449   BILIRUBINUR Negative 05/08/2014 0316    KETONESUR NEGATIVE 11/25/2023 0449   PROTEINUR NEGATIVE 11/25/2023 0449   NITRITE NEGATIVE 11/25/2023 0449   LEUKOCYTESUR NEGATIVE 11/25/2023 0449   LEUKOCYTESUR Negative 05/08/2014 0316   Sepsis Labs Recent Labs  Lab 11/25/23 0039 11/26/23 0540  WBC 15.0* 12.2*   Microbiology Recent Results (from the past 240 hours)  Resp panel by RT-PCR (RSV, Flu A&B, Covid) Anterior Nasal Swab     Status: None   Collection Time: 11/25/23 10:58 AM   Specimen: Anterior Nasal Swab  Result Value Ref Range Status   SARS Coronavirus 2 by RT PCR NEGATIVE NEGATIVE Final    Comment: (NOTE) SARS-CoV-2 target nucleic acids are NOT DETECTED.  The SARS-CoV-2 RNA is generally detectable in upper respiratory specimens during the acute phase of infection. The lowest concentration  of SARS-CoV-2 viral copies this assay can detect is 138 copies/mL. A negative result does not preclude SARS-Cov-2 infection and should not be used as the sole basis for treatment or other patient management decisions. A negative result may occur with  improper specimen collection/handling, submission of specimen other than nasopharyngeal swab, presence of viral mutation(s) within the areas targeted by this assay, and inadequate number of viral copies(<138 copies/mL). A negative result must be combined with clinical observations, patient history, and epidemiological information. The expected result is Negative.  Fact Sheet for Patients:  BloggerCourse.com  Fact Sheet for Healthcare Providers:  SeriousBroker.it  This test is no t yet approved or cleared by the [place] FDA and  has been authorized for detection and/or diagnosis of SARS-CoV-2 by FDA under an Emergency Use Authorization (EUA). This EUA will remain  in effect (meaning this test can be used) for the duration of the COVID-19 declaration under Section 564(b)(1) of the Act, 21 U.S.C.section 360bbb-3(b)(1), unless  the authorization is terminated  or revoked sooner.       Influenza A by PCR NEGATIVE NEGATIVE Final   Influenza B by PCR NEGATIVE NEGATIVE Final    Comment: (NOTE) The Xpert Xpress SARS-CoV-2/FLU/RSV plus assay is intended as an aid in the diagnosis of influenza from Nasopharyngeal swab specimens and should not be used as a sole basis for treatment. Nasal washings and aspirates are unacceptable for Xpert Xpress SARS-CoV-2/FLU/RSV testing.  Fact Sheet for Patients: BloggerCourse.com  Fact Sheet for Healthcare Providers: SeriousBroker.it  This test is not yet approved or cleared by the [place] FDA and has been authorized for detection and/or diagnosis of SARS-CoV-2 by FDA under an Emergency Use Authorization (EUA). This EUA will remain in effect (meaning this test can be used) for the duration of the COVID-19 declaration under Section 564(b)(1) of the Act, 21 U.S.C. section 360bbb-3(b)(1), unless the authorization is terminated or revoked.     Resp Syncytial Virus by PCR NEGATIVE NEGATIVE Final    Comment: (NOTE) Fact Sheet for Patients: BloggerCourse.com  Fact Sheet for Healthcare Providers: SeriousBroker.it  This test is not yet approved or cleared by the [place] FDA and has been authorized for detection and/or diagnosis of SARS-CoV-2 by FDA under an Emergency Use Authorization (EUA). This EUA will remain in effect (meaning this test can be used) for the duration of the COVID-19 declaration under Section 564(b)(1) of the Act, 21 U.S.C. section 360bbb-3(b)(1), unless the authorization is terminated or revoked.  Performed at [facility], [address] [place], [place] [address]   Culture, blood (Routine X 2) w Reflex to ID Panel     Status: None (Preliminary result)   Collection Time: 11/25/23  7:54 PM   Specimen: BLOOD LEFT ARM  Result Value Ref  Range Status   Specimen Description BLOOD LEFT ARM  Final   Special Requests   Final    BOTTLES DRAWN AEROBIC ONLY Blood Culture results may not be optimal due to an inadequate volume of blood received in culture bottles   Culture   Final    NO GROWTH < 12 HOURS Performed at [facility], [address] [place], [place] [address]    Report Status PENDING  Incomplete  Culture, blood (Routine X 2) w Reflex to ID Panel     Status: None (Preliminary result)   Collection Time: 11/25/23  8:53 PM   Specimen: BLOOD RIGHT HAND  Result Value Ref Range Status   Specimen Description BLOOD RIGHT HAND  Final   Special Requests   Final    BOTTLES DRAWN AEROBIC AND ANAEROBIC Blood Culture adequate volume   Culture   Final    NO GROWTH < 12 HOURS Performed at [facility], [address] [place], [place] [address]    Report Status PENDING  Incomplete     Total time spend on discharging this patient, including the last patient exam, discussing the hospital stay, instructions for ongoing care as it relates to all pertinent caregivers, as well as preparing the medical discharge records, prescriptions, and/or referrals as applicable, is 35 minutes.    Darlin Priestly, MD  [facility] 11/26/2023, 8:11 AM

## 2023-11-26 NOTE — Progress Notes (Signed)
 Pt accidentally pulled IV while sleeping, patient is refusing IV reinsertion, provider made aware.

## 2023-11-26 NOTE — TOC Transition Note (Signed)
 Transition of Care [facility]) - Discharge Note   Patient Details  Name: Teresa Sparks MRN: [id number] Date of Birth: 1951/09/27  [facility] CM/SW Contact:  Bing Quarry, RN Phone Number: 11/26/2023, 9:55 AM   Clinical Narrative: 3/1: Discharge orders in for today. No TOC consult. Patient Declined SDOH assessment on admission per notes. No therapy recommendations. OBS status at 15 hours. RN notes indicate patient readied for discharge and already escorted off the unit as of this transition note.    Gabriel Cirri MSN RN CM  RN Case Manager [place]  Transitions of Care Direct Dial: [phone] (Weekends Only) [facility] Main Office Phone: [phone] [facility] Fax: [phone] [email or web address]            Patient Goals and CMS Choice            Discharge Placement                       Discharge Plan and Services Additional resources added to the After Visit Summary for                                       Social Drivers of Health (SDOH) Interventions SDOH Screenings   Food Insecurity: Patient Declined (11/26/2023)  Housing: Patient Declined (11/26/2023)  Transportation Needs: Patient Declined (11/26/2023)  Utilities: Patient Declined (11/26/2023)  Social Connections: Unknown (11/26/2023)  Tobacco Use: High Risk (11/25/2023)     Readmission Risk Interventions     No data to display

## 2023-11-26 NOTE — Plan of Care (Signed)

## 2023-11-26 NOTE — Plan of Care (Signed)
 Problem: Education: Goal: Understanding of CV disease, CV risk reduction, and recovery process will improve 11/26/2023 0504 by Glena Norfolk, RN Outcome: Progressing 11/26/2023 0503 by Glena Norfolk, RN Outcome: Progressing Goal: Individualized Educational Video(s) 11/26/2023 0504 by Glena Norfolk, RN Outcome: Progressing 11/26/2023 0503 by Glena Norfolk, RN Outcome: Progressing   Problem: Activity: Goal: Ability to return to baseline activity level will improve 11/26/2023 0504 by Glena Norfolk, RN Outcome: Progressing 11/26/2023 0503 by Nicoletta Dress A, RN Outcome: Progressing   Problem: Cardiovascular: Goal: Ability to achieve and maintain adequate cardiovascular perfusion will improve 11/26/2023 0504 by Glena Norfolk, RN Outcome: Progressing 11/26/2023 0503 by Nicoletta Dress A, RN Outcome: Progressing Goal: Vascular access site(s) Level 0-1 will be maintained 11/26/2023 0504 by Glena Norfolk, RN Outcome: Progressing 11/26/2023 0503 by Nicoletta Dress A, RN Outcome: Progressing   Problem: Health Behavior/Discharge Planning: Goal: Ability to safely manage health-related needs after discharge will improve 11/26/2023 0504 by Glena Norfolk, RN Outcome: Progressing 11/26/2023 0503 by Glena Norfolk, RN Outcome: Progressing   Problem: Education: Goal: Knowledge of General Education information will improve Description: Including pain rating scale, medication(s)/side effects and non-pharmacologic comfort measures 11/26/2023 0504 by Glena Norfolk, RN Outcome: Progressing 11/26/2023 0503 by Glena Norfolk, RN Outcome: Progressing   Problem: Health Behavior/Discharge Planning: Goal: Ability to manage health-related needs will improve 11/26/2023 0504 by Glena Norfolk, RN Outcome: Progressing 11/26/2023 0503 by Nicoletta Dress A, RN Outcome: Progressing   Problem: Clinical Measurements: Goal: Ability to maintain clinical  measurements within normal limits will improve 11/26/2023 0504 by Glena Norfolk, RN Outcome: Progressing 11/26/2023 0503 by Nicoletta Dress A, RN Outcome: Progressing Goal: Will remain free from infection 11/26/2023 0504 by Glena Norfolk, RN Outcome: Progressing 11/26/2023 0503 by Nicoletta Dress A, RN Outcome: Progressing Goal: Diagnostic test results will improve 11/26/2023 0504 by Glena Norfolk, RN Outcome: Progressing 11/26/2023 0503 by Nicoletta Dress A, RN Outcome: Progressing Goal: Respiratory complications will improve 11/26/2023 0504 by Glena Norfolk, RN Outcome: Progressing 11/26/2023 0503 by Nicoletta Dress A, RN Outcome: Progressing Goal: Cardiovascular complication will be avoided 11/26/2023 0504 by Glena Norfolk, RN Outcome: Progressing 11/26/2023 0503 by Glena Norfolk, RN Outcome: Progressing   Problem: Activity: Goal: Risk for activity intolerance will decrease 11/26/2023 0504 by Glena Norfolk, RN Outcome: Progressing 11/26/2023 0503 by Nicoletta Dress A, RN Outcome: Progressing   Problem: Nutrition: Goal: Adequate nutrition will be maintained 11/26/2023 0504 by Glena Norfolk, RN Outcome: Progressing 11/26/2023 0503 by Nicoletta Dress A, RN Outcome: Progressing   Problem: Coping: Goal: Level of anxiety will decrease 11/26/2023 0504 by Glena Norfolk, RN Outcome: Progressing 11/26/2023 0503 by Nicoletta Dress A, RN Outcome: Progressing   Problem: Elimination: Goal: Will not experience complications related to bowel motility 11/26/2023 0504 by Glena Norfolk, RN Outcome: Progressing 11/26/2023 0503 by Nicoletta Dress A, RN Outcome: Progressing Goal: Will not experience complications related to urinary retention 11/26/2023 0504 by Glena Norfolk, RN Outcome: Progressing 11/26/2023 0503 by Nicoletta Dress A, RN Outcome: Progressing   Problem: Pain Managment: Goal: General experience of comfort will  improve and/or be controlled 11/26/2023 0504 by Glena Norfolk, RN Outcome: Progressing 11/26/2023 0503 by Nicoletta Dress A, RN Outcome: Progressing   Problem: Safety: Goal: Ability to remain free from injury will improve 11/26/2023 0504 by Glena Norfolk, RN Outcome: Progressing 11/26/2023 0503 by Glena Norfolk, RN Outcome: Progressing   Problem: Skin Integrity: Goal: Risk  for impaired skin integrity will decrease 11/26/2023 0504 by Glena Norfolk, RN Outcome: Progressing 11/26/2023 0503 by Glena Norfolk, RN Outcome: Progressing

## 2023-11-26 NOTE — Plan of Care (Signed)
 Pt D/c to home, AVS reviewed all questions answered. Removed IV, all personal belongings returned. BP (!) 143/69 (BP Location: Right Arm)   Pulse 84   Temp 97.6 F (36.4 C) (Oral)   Resp 18   Ht 5\' 9"  (1.753 m)   Wt 103 kg   SpO2 99%   BMI 33.52 kg/m  NO c/o pain,  Escorted patient off floor    Reva Bores 11/26/23 9:15 AM

## 2023-11-28 ENCOUNTER — Encounter: Payer: Self-pay | Admitting: Vascular Surgery

## 2023-11-28 MED ORDER — LIDOCAINE-EPINEPHRINE (PF) 1 %-1:200000 IJ SOLN
INTRAMUSCULAR | Status: AC | PRN
  Administered 2023-11-25: 10 mL via INTRADERMAL

## 2023-11-28 MED ORDER — IODIXANOL 320 MG/ML IV SOLN
INTRAVENOUS | Status: DC | PRN
  Administered 2023-11-25: 75 mL via INTRA_ARTERIAL

## 2023-11-28 MED ORDER — HEPARIN (PORCINE) IN NACL 2000-0.9 UNIT/L-% IV SOLN
INTRAVENOUS | Status: AC | PRN
  Administered 2023-11-25: 1000 mL

## 2023-11-30 LAB — CULTURE, BLOOD (ROUTINE X 2)
Culture: NO GROWTH
Culture: NO GROWTH
Special Requests: ADEQUATE

## 2023-12-01 ENCOUNTER — Ambulatory Visit
Admission: RE | Admit: 2023-12-01 | Discharge: 2023-12-01 | Disposition: A | Discharge status: Home / Self Care | Payer: Medicare PPO | Source: Ambulatory Visit | Attending: Family Medicine | Admitting: Family Medicine

## 2023-12-01 DIAGNOSIS — Z1231 Encounter for screening mammogram for malignant neoplasm of breast: Secondary | ICD-10-CM | POA: Insufficient documentation

## 2023-12-06 ENCOUNTER — Other Ambulatory Visit: Payer: Self-pay | Admitting: Gastroenterology

## 2023-12-06 DIAGNOSIS — R1013 Epigastric pain: Secondary | ICD-10-CM

## 2023-12-06 DIAGNOSIS — K219 Gastro-esophageal reflux disease without esophagitis: Secondary | ICD-10-CM

## 2023-12-16 ENCOUNTER — Other Ambulatory Visit

## 2024-01-23 ENCOUNTER — Other Ambulatory Visit (INDEPENDENT_AMBULATORY_CARE_PROVIDER_SITE_OTHER): Payer: Self-pay | Admitting: Vascular Surgery

## 2024-01-23 DIAGNOSIS — I70209 Unspecified atherosclerosis of native arteries of extremities, unspecified extremity: Secondary | ICD-10-CM

## 2024-01-26 ENCOUNTER — Ambulatory Visit (INDEPENDENT_AMBULATORY_CARE_PROVIDER_SITE_OTHER)

## 2024-01-26 ENCOUNTER — Other Ambulatory Visit (INDEPENDENT_AMBULATORY_CARE_PROVIDER_SITE_OTHER)

## 2024-01-26 DIAGNOSIS — I70203 Unspecified atherosclerosis of native arteries of extremities, bilateral legs: Secondary | ICD-10-CM

## 2024-01-26 DIAGNOSIS — I70209 Unspecified atherosclerosis of native arteries of extremities, unspecified extremity: Secondary | ICD-10-CM | POA: Diagnosis not present

## 2024-01-29 DIAGNOSIS — I70219 Atherosclerosis of native arteries of extremities with intermittent claudication, unspecified extremity: Secondary | ICD-10-CM | POA: Insufficient documentation

## 2024-01-29 NOTE — Progress Notes (Signed)
 MRN : [id number]  Teresa Sparks is a 73 y.o. (1951/04/23) female who presents with chief complaint of check circulation.  History of Present Illness:   The patient returns to the office for followup and review status post angiogram with intervention on 11/25/2023.   Procedure: Diagnostic Mesenteric  angiogram: Aortogram:  The abdominal aorta is opacified with a bolus injection contrast. The origin of the celiac appears to be at the level of T11 with the SMA just below. In the lateral projection there is no evidence of hemodynamically significant plaque formation or stenosis of the origin of the SMA and the celiac artery demonstrates the previously placed stent which is widely patent. With the detector in the AP projection the SMA and the celiac artery is evaluated and demonstrates normal flow throughout it's course without any distal lesions identified.  No evidence for vasculitis or distal emboli.     Common femoral is patent but there are eccentric lesions now present and I did not feel it was a suitable situation for closure with a Starclose based on the location of the access arteriotomy.  The sheath was pulled and manual pressure was held.   Summary: Widely patent arterial system with little evidence for atherosclerotic changes there is no hemodynamically significant lesions noted throughout the visceral arteries.  The patient notes improvement in the lower extremity symptoms. No interval shortening of the patient's claudication distance or rest pain symptoms. No new ulcers or wounds have occurred since the last visit.  There have been no significant changes to the patient's overall health care.  No documented history of amaurosis fugax or recent TIA symptoms. There are no recent neurological changes noted. No documented history of DVT, PE or superficial thrombophlebitis. The patient denies recent episodes of  angina or shortness of breath.    No outpatient medications have been marked as taking for the 01/30/24 encounter (Appointment) with Prescilla Brod, Ninette Basque, MD.    Past Medical History:  Diagnosis Date   Adenomatous colon polyp    Adrenal adenoma    Allergic genetic state    Arthritis    Chronic abdominal pain    GERD (gastroesophageal reflux disease)    Hiatal hernia    Hypertension    Sleep apnea     Past Surgical History:  Procedure Laterality Date   ABDOMINAL HYSTERECTOMY     CHOLECYSTECTOMY     COLONOSCOPY     COLONOSCOPY WITH PROPOFOL  N/A 07/24/2018   Procedure: COLONOSCOPY WITH PROPOFOL ;  Surgeon: Cassie Click, MD;  Location: [facility] ENDOSCOPY;  Service: Endoscopy;  Laterality: N/A;   COLONOSCOPY WITH PROPOFOL  N/A 11/23/2023   Procedure: COLONOSCOPY WITH PROPOFOL ;  Surgeon: Toledo, Alphonsus Jeans, MD;  Location: [facility] ENDOSCOPY;  Service: Gastroenterology;  Laterality: N/A;   ENDOBRONCHIAL ULTRASOUND Bilateral 09/30/2023   Procedure: ENDOBRONCHIAL ULTRASOUND;  Surgeon: Marc Senior, MD;  Location: [facility] ORS;  Service: Pulmonary;  Laterality: Bilateral;   ESOPHAGOGASTRODUODENOSCOPY (EGD) WITH PROPOFOL  N/A 11/23/2023   Procedure: ESOPHAGOGASTRODUODENOSCOPY (EGD) WITH PROPOFOL ;  Surgeon: Toledo, Alphonsus Jeans, MD;  Location: [facility] ENDOSCOPY;  Service: Gastroenterology;  Laterality: N/A;   FRACTURE  SURGERY     HEMOSTASIS CLIP PLACEMENT  11/23/2023   Procedure: HEMOSTASIS CLIP PLACEMENT;  Surgeon: Corky Diener, Teodoro K, MD;  Location: [facility] ENDOSCOPY;  Service: Gastroenterology;;   HOT HEMOSTASIS  11/23/2023   Procedure: HOT HEMOSTASIS (ARGON PLASMA COAGULATION/BICAP);  Surgeon: Corky Diener, Alphonsus Jeans, MD;  Location: [facility] ENDOSCOPY;  Service: Gastroenterology;;   JOINT REPLACEMENT Right    6 years ago   POLYPECTOMY  11/23/2023   Procedure: POLYPECTOMY;  Surgeon: Corky Diener, Alphonsus Jeans, MD;  Location: [facility] ENDOSCOPY;  Service: Gastroenterology;;   VISCERAL ANGIOGRAPHY N/A 02/05/2022   Procedure: VISCERAL  ANGIOGRAPHY;  Surgeon: Jackquelyn Mass, MD;  Location: [facility] INVASIVE CV LAB;  Service: Cardiovascular;  Laterality: N/A;   VISCERAL ANGIOGRAPHY N/A 10/04/2023   Procedure: VISCERAL ANGIOGRAPHY;  Surgeon: Jackquelyn Mass, MD;  Location: [facility] INVASIVE CV LAB;  Service: Cardiovascular;  Laterality: N/A;   VISCERAL ANGIOGRAPHY N/A 11/25/2023   Procedure: VISCERAL ANGIOGRAPHY;  Surgeon: Jackquelyn Mass, MD;  Location: [facility] INVASIVE CV LAB;  Service: Cardiovascular;  Laterality: N/A;    Social History Social History   Tobacco Use   Smoking status: Former    Average packs/day: 0.3 packs/day for 2.0 years (0.5 ttl pk-yrs)    Types: Cigarettes    Start date: 1971   Smokeless tobacco: Current    Types: Snuff  Vaping Use   Vaping status: Never Used  Substance Use Topics   Alcohol use: No   Drug use: Never    Family History Family History  Problem Relation Age of Onset   Hypertension Mother    Stroke Mother    Epilepsy Mother    Colon polyps Sister    Breast cancer Other     Allergies  Allergen Reactions   Iodinated Contrast Media Anaphylaxis    Other reaction(s): Vomiting   Ciprofloxacin Diarrhea and Nausea Only    Nausea, vomiting  Other reaction(s): Vomiting   Codeine Sulfate [Codeine] Diarrhea    Nausea, vomiting   Meloxicam    Penicillins Nausea Only   Protonix  [Pantoprazole  Sodium] Hives   Shellfish Allergy Swelling   Sulfa Antibiotics Rash     REVIEW OF SYSTEMS (Negative unless checked)  Constitutional: [] Weight loss  [] Fever  [] Chills Cardiac: [] Chest pain   [] Chest pressure   [] Palpitations   [] Shortness of breath when laying flat   [] Shortness of breath with exertion. Vascular:  [x] Pain in legs with walking   [] Pain in legs at rest  [] History of DVT   [] Phlebitis   [] Swelling in legs   [] Varicose veins   [] Non-healing ulcers Pulmonary:   [] Uses home oxygen   [] Productive cough   [] Hemoptysis   [] Wheeze  [] COPD   [] Asthma Neurologic:  [] Dizziness    [] Seizures   [] History of stroke   [] History of TIA  [] Aphasia   [] Vissual changes   [] Weakness or numbness in arm   [] Weakness or numbness in leg Musculoskeletal:   [] Joint swelling   [] Joint pain   [] Low back pain Hematologic:  [] Easy bruising  [] Easy bleeding   [] Hypercoagulable state   [] Anemic Gastrointestinal:  [] Diarrhea   [] Vomiting  [x] Gastroesophageal reflux/heartburn   [] Difficulty swallowing. Genitourinary:  [] Chronic kidney disease   [] Difficult urination  [] Frequent urination   [] Blood in urine Skin:  [] Rashes   [] Ulcers  Psychological:  [] History of anxiety   []  History of major depression.  Physical Examination  There were no vitals filed for this visit. There is no height or weight on file to calculate BMI. Gen: WD/WN, NAD Head:  [place]/AT, No temporalis wasting.  Ear/Nose/Throat: Hearing grossly intact, nares w/o erythema or drainage Eyes: PER, EOMI, sclera nonicteric.  Neck: Supple, no masses.  No bruit or JVD.  Pulmonary:  Good air movement, no audible wheezing, no use of accessory muscles.  Cardiac: RRR, normal S1, S2, no Murmurs. Vascular:  mild trophic changes, no open wounds Vessel Right Left  Radial Palpable Palpable  PT Not Palpable Not Palpable  DP Not Palpable Not Palpable  Gastrointestinal: soft, non-distended. No guarding/no peritoneal signs.  Musculoskeletal: M/S 5/5 throughout.  No visible deformity.  Neurologic: CN 2-12 intact. Pain and light touch intact in extremities.  Symmetrical.  Speech is fluent. Motor exam as listed above. Psychiatric: Judgment intact, Mood & affect appropriate for pt's clinical situation. Dermatologic: No rashes or ulcers noted.  No changes consistent with cellulitis.   CBC Lab Results  Component Value Date   WBC 12.2 (H) 11/26/2023   HGB 9.9 (L) 11/26/2023   HCT 31.3 (L) 11/26/2023   MCV 76.9 (L) 11/26/2023   PLT 259 11/26/2023    BMET    Component Value Date/Time   NA 138 11/26/2023 0540   NA 138 05/08/2014 0151   K  3.3 (L) 11/26/2023 0540   K 3.8 05/08/2014 0151   CL 108 11/26/2023 0540   CL 106 05/08/2014 0151   CO2 22 11/26/2023 0540   CO2 23 05/08/2014 0151   GLUCOSE 128 (H) 11/26/2023 0540   GLUCOSE 97 05/08/2014 0151   BUN 12 11/26/2023 0540   BUN 16 05/08/2014 0151   CREATININE 0.75 11/26/2023 0540   CREATININE 1.25 05/08/2014 0151   CALCIUM  9.4 11/26/2023 0540   CALCIUM  8.8 05/08/2014 0151   GFRNONAA >60 11/26/2023 0540   GFRNONAA 46 (L) 05/08/2014 0151   GFRAA 56 (L) 04/23/2018 0901   GFRAA 54 (L) 05/08/2014 0151   CrCl cannot be calculated (Patient's most recent lab result is older than the maximum 21 days allowed.).  COAG Lab Results  Component Value Date   INR 1.0 10/03/2023    Radiology VAS US  ABI WITH/WO TBI Result Date: 01/26/2024  LOWER EXTREMITY DOPPLER STUDY Patient Name:  MAEDEAN GURGANIOUS  Date of Exam:   01/26/2024 Medical Rec #: [id number]            Accession #:    [id number] Date of Birth: Dec 31, 1950            Patient Gender: F Patient Age:   41 years Exam Location:  [facility] Procedure:      VAS US  ABI WITH/WO TBI Referring Phys: Devon Fogo --------------------------------------------------------------------------------  Indications: femoral plaque seen during on visceral angiogram  Performing Technologist: Florie Husband RT, RDMS, RVT  Examination Guidelines: A complete evaluation includes at minimum, Doppler waveform signals and systolic blood pressure reading at the level of bilateral brachial, anterior tibial, and posterior tibial arteries, when vessel segments are accessible. Bilateral testing is considered an integral part of a complete examination. Photoelectric Plethysmograph (PPG) waveforms and toe systolic pressure readings are included as required and additional duplex testing as needed. Limited examinations for reoccurring indications may be performed as noted.  ABI Findings:  +---------+-------------+-----+---------------------+--------------------------+ Right    Rt Pressure  IndexWaveform             Comment                             (mmHg)                                                            +---------+-------------+-----+---------------------+--------------------------+  Brachial 142                                                               +---------+-------------+-----+---------------------+--------------------------+ CFA                        multiphasic          no significant stenosis                                                    via duplex                 +---------+-------------+-----+---------------------+--------------------------+ ATA                        not detected by      biphasic retrograde flow                              Doppler              noted via duplex           +---------+-------------+-----+---------------------+--------------------------+ PTA                        multiphasic          non-compressible           +---------+-------------+-----+---------------------+--------------------------+ PERO     163          1.15 multiphasic                                     +---------+-------------+-----+---------------------+--------------------------+ Great Toe109          0.77 Normal                                          +---------+-------------+-----+---------------------+--------------------------+ +---------+-----------------+-----+-----------+--------------------------------+ Left     Lt Pressure      IndexWaveform   Comment                                   (mmHg)                                                            +---------+-----------------+-----+-----------+--------------------------------+ Brachial 139                                                               +---------+-----------------+-----+-----------+--------------------------------+ CFA  multiphasicno significant stenosis via                                                duplex                           +---------+-----------------+-----+-----------+--------------------------------+ PTA      165              1.16 multiphasic                                 +---------+-----------------+-----+-----------+--------------------------------+ DP       148              1.04 multiphasic                                 +---------+-----------------+-----+-----------+--------------------------------+ Great Toe118              0.83 Normal                                      +---------+-----------------+-----+-----------+--------------------------------+  Summary: Right: Resting right ankle-brachial index indicates noncompressible right lower extremity arteries. Doppler waveforms and normal TBI suggest normal perfusion to the lower extremity. 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 Left: Resting left ankle-brachial index is within normal range. The left toe-brachial index is normal. *See table(s) above for measurements and observations.  Electronically signed by Devon Fogo MD on 01/26/2024 at 3:51:47 PM.    Final      Assessment/Plan 1. Atherosclerosis of native artery of both lower extremities with intermittent claudication (HCC) (Primary) Recommend:  The patient is status post successful angiogram with intervention.  The patient reports that the claudication symptoms and leg pain has improved.   The patient denies lifestyle limiting changes at this point in time.  No further invasive studies, angiography or surgery at this time. The patient should continue walking and begin a more formal exercise program.  The patient should continue antiplatelet therapy and aggressive treatment of the lipid abnormalities  Continued surveillance is indicated as atherosclerosis is likely to progress with time.    Patient should  undergo noninvasive studies as ordered. The patient will follow up with me to review the studies.  - VAS US  ABI WITH/WO TBI; Future - VAS US  LOWER EXTREMITY ARTERIAL DUPLEX; Future  2. Pain of upper abdomen This does not appear to be vascular defer further workup to primary service  3. Mixed hyperlipidemia Continue statin as ordered and reviewed, no changes at this time  4. Gastroesophageal reflux disease, unspecified whether esophagitis present Continue PPI as already ordered, this medication has been reviewed and there are no changes at this time.  Avoidence of caffeine and alcohol  Moderate elevation of the head of the bed     Devon Fogo, MD  01/29/2024 3:07 PM

## 2024-01-30 ENCOUNTER — Ambulatory Visit (INDEPENDENT_AMBULATORY_CARE_PROVIDER_SITE_OTHER): Admitting: Vascular Surgery

## 2024-01-30 ENCOUNTER — Encounter (INDEPENDENT_AMBULATORY_CARE_PROVIDER_SITE_OTHER): Payer: Self-pay | Admitting: Vascular Surgery

## 2024-01-30 VITALS — BP 157/77 | HR 64 | Resp 16 | Wt 229.8 lb

## 2024-01-30 DIAGNOSIS — R1010 Upper abdominal pain, unspecified: Secondary | ICD-10-CM

## 2024-01-30 DIAGNOSIS — I70213 Atherosclerosis of native arteries of extremities with intermittent claudication, bilateral legs: Secondary | ICD-10-CM

## 2024-01-30 DIAGNOSIS — E782 Mixed hyperlipidemia: Secondary | ICD-10-CM | POA: Diagnosis not present

## 2024-01-30 DIAGNOSIS — K219 Gastro-esophageal reflux disease without esophagitis: Secondary | ICD-10-CM | POA: Diagnosis not present

## 2024-02-05 ENCOUNTER — Encounter (INDEPENDENT_AMBULATORY_CARE_PROVIDER_SITE_OTHER): Payer: Self-pay | Admitting: Vascular Surgery

## 2024-04-05 ENCOUNTER — Telehealth (INDEPENDENT_AMBULATORY_CARE_PROVIDER_SITE_OTHER): Payer: Self-pay | Admitting: Vascular Surgery

## 2024-04-05 NOTE — Telephone Encounter (Signed)
 LVM for pt advising that I had added the mesenteric onto her existing appt on 8.4.25. I advised to call with any questions or if we need to reschedule.

## 2024-04-27 ENCOUNTER — Other Ambulatory Visit (INDEPENDENT_AMBULATORY_CARE_PROVIDER_SITE_OTHER): Payer: Self-pay | Admitting: Vascular Surgery

## 2024-04-27 DIAGNOSIS — I774 Celiac artery compression syndrome: Secondary | ICD-10-CM

## 2024-04-29 NOTE — Progress Notes (Signed)
 MRN : [id number]  Teresa Sparks is a 73 y.o. (03/19/1951) female who presents with chief complaint of check circulation.  History of Present Illness:   The patient notes improvement in the lower extremity symptoms. No interval shortening of the patient's claudication distance or rest pain symptoms. No new ulcers or wounds have occurred since the last visit.   There have been no significant changes to the patient's overall health care.   No documented history of amaurosis fugax or recent TIA symptoms. There are no recent neurological changes noted. No documented history of DVT, PE or superficial thrombophlebitis. The patient denies recent episodes of angina or shortness of breath.   Duplex ultrasound of the mesenteric vessels dated April 30, 2024 demonstrates widely patent SMA and IMA.  Celiac artery demonstrates a 50 to 69% stenosis at its origin.  No outpatient medications have been marked as taking for the 04/30/24 encounter (Appointment) with Jama, Cordella MATSU, MD.    Past Medical History:  Diagnosis Date   Adenomatous colon polyp    Adrenal adenoma    Allergic genetic state    Arthritis    Chronic abdominal pain    GERD (gastroesophageal reflux disease)    Hiatal hernia    Hypertension    Sleep apnea     Past Surgical History:  Procedure Laterality Date   ABDOMINAL HYSTERECTOMY     CHOLECYSTECTOMY     COLONOSCOPY     COLONOSCOPY WITH PROPOFOL  N/A 07/24/2018   Procedure: COLONOSCOPY WITH PROPOFOL ;  Surgeon: Viktoria Lamar DASEN, MD;  Location: [facility] ENDOSCOPY;  Service: Endoscopy;  Laterality: N/A;   COLONOSCOPY WITH PROPOFOL  N/A 11/23/2023   Procedure: COLONOSCOPY WITH PROPOFOL ;  Surgeon: Toledo, Ladell POUR, MD;  Location: [facility] ENDOSCOPY;  Service: Gastroenterology;  Laterality: N/A;   ENDOBRONCHIAL ULTRASOUND Bilateral 09/30/2023   Procedure: ENDOBRONCHIAL ULTRASOUND;  Surgeon: Tamea Dedra CROME,  MD;  Location: [facility] ORS;  Service: Pulmonary;  Laterality: Bilateral;   ESOPHAGOGASTRODUODENOSCOPY (EGD) WITH PROPOFOL  N/A 11/23/2023   Procedure: ESOPHAGOGASTRODUODENOSCOPY (EGD) WITH PROPOFOL ;  Surgeon: Toledo, Ladell POUR, MD;  Location: [facility] ENDOSCOPY;  Service: Gastroenterology;  Laterality: N/A;   FRACTURE SURGERY     HEMOSTASIS CLIP PLACEMENT  11/23/2023   Procedure: HEMOSTASIS CLIP PLACEMENT;  Surgeon: Aundria, Teodoro K, MD;  Location: [facility] ENDOSCOPY;  Service: Gastroenterology;;   HOT HEMOSTASIS  11/23/2023   Procedure: HOT HEMOSTASIS (ARGON PLASMA COAGULATION/BICAP);  Surgeon: Aundria, Ladell POUR, MD;  Location: [facility] ENDOSCOPY;  Service: Gastroenterology;;   JOINT REPLACEMENT Right    6 years ago   POLYPECTOMY  11/23/2023   Procedure: POLYPECTOMY;  Surgeon: Aundria, Ladell POUR, MD;  Location: [facility] ENDOSCOPY;  Service: Gastroenterology;;   VISCERAL ANGIOGRAPHY N/A 02/05/2022   Procedure: VISCERAL ANGIOGRAPHY;  Surgeon: Jama Cordella MATSU, MD;  Location: [facility] INVASIVE CV LAB;  Service: Cardiovascular;  Laterality: N/A;   VISCERAL ANGIOGRAPHY N/A 10/04/2023   Procedure: VISCERAL ANGIOGRAPHY;  Surgeon: Jama Cordella MATSU, MD;  Location: [facility] INVASIVE CV LAB;  Service: Cardiovascular;  Laterality: N/A;   VISCERAL ANGIOGRAPHY N/A 11/25/2023   Procedure: VISCERAL ANGIOGRAPHY;  Surgeon: Jama Cordella MATSU, MD;  Location:  [facility] INVASIVE CV LAB;  Service: Cardiovascular;  Laterality: N/A;    Social History Social History   Tobacco Use   Smoking status: Former    Average packs/day: 0.3 packs/day for 2.0 years (0.5 ttl pk-yrs)    Types: Cigarettes    Start date: 1971   Smokeless tobacco: Current    Types: Snuff  Vaping Use   Vaping status: Never Used  Substance Use Topics   Alcohol use: No   Drug use: Never    Family History Family History  Problem Relation Age of Onset   Hypertension Mother    Stroke Mother    Epilepsy Mother    Colon polyps Sister    Breast cancer Other     Allergies   Allergen Reactions   Iodinated Contrast Media Anaphylaxis    Other reaction(s): Vomiting   Ciprofloxacin Diarrhea and Nausea Only    Nausea, vomiting  Other reaction(s): Vomiting   Codeine Sulfate [Codeine] Diarrhea    Nausea, vomiting   Meloxicam    Penicillins Nausea Only   Protonix  [Pantoprazole  Sodium] Hives   Shellfish Allergy Swelling   Sulfa Antibiotics Rash     REVIEW OF SYSTEMS (Negative unless checked)  Constitutional: [] Weight loss  [] Fever  [] Chills Cardiac: [] Chest pain   [] Chest pressure   [] Palpitations   [] Shortness of breath when laying flat   [] Shortness of breath with exertion. Vascular:  [x] Pain in legs with walking   [] Pain in legs at rest  [] History of DVT   [] Phlebitis   [] Swelling in legs   [] Varicose veins   [] Non-healing ulcers Pulmonary:   [] Uses home oxygen   [] Productive cough   [] Hemoptysis   [] Wheeze  [] COPD   [] Asthma Neurologic:  [] Dizziness   [] Seizures   [] History of stroke   [] History of TIA  [] Aphasia   [] Vissual changes   [] Weakness or numbness in arm   [] Weakness or numbness in leg Musculoskeletal:   [] Joint swelling   [] Joint pain   [] Low back pain Hematologic:  [] Easy bruising  [] Easy bleeding   [] Hypercoagulable state   [] Anemic Gastrointestinal:  [] Diarrhea   [] Vomiting  [x] Gastroesophageal reflux/heartburn   [] Difficulty swallowing. Genitourinary:  [] Chronic kidney disease   [] Difficult urination  [] Frequent urination   [] Blood in urine Skin:  [] Rashes   [] Ulcers  Psychological:  [] History of anxiety   []  History of major depression.  Physical Examination  There were no vitals filed for this visit. There is no height or weight on file to calculate BMI. Gen: WD/WN, NAD Head: [place]/AT, No temporalis wasting.  Ear/Nose/Throat: Hearing grossly intact, nares w/o erythema or drainage Eyes: PER, EOMI, sclera nonicteric.  Neck: Supple, no masses.  No bruit or JVD.  Pulmonary:  Good air movement, no audible wheezing, no use of accessory  muscles.  Cardiac: RRR, normal S1, S2, no Murmurs. Vascular:  mild trophic changes, no open wounds Vessel Right Left  Radial Palpable Palpable  PT Not Palpable Not Palpable  DP Not Palpable Not Palpable  Gastrointestinal: soft, non-distended. No guarding/no peritoneal signs.  Musculoskeletal: M/S 5/5 throughout.  No visible deformity.  Neurologic: CN 2-12 intact. Pain and light touch intact in extremities.  Symmetrical.  Speech is fluent. Motor exam as listed above. Psychiatric: Judgment intact, Mood & affect appropriate for pt's clinical situation. Dermatologic: No rashes or ulcers noted.  No changes consistent with cellulitis.   CBC Lab Results  Component Value Date   WBC 12.2 (H) 11/26/2023   HGB 9.9 (L) 11/26/2023   HCT 31.3 (  L) 11/26/2023   MCV 76.9 (L) 11/26/2023   PLT 259 11/26/2023    BMET    Component Value Date/Time   NA 138 11/26/2023 0540   NA 138 05/08/2014 0151   K 3.3 (L) 11/26/2023 0540   K 3.8 05/08/2014 0151   CL 108 11/26/2023 0540   CL 106 05/08/2014 0151   CO2 22 11/26/2023 0540   CO2 23 05/08/2014 0151   GLUCOSE 128 (H) 11/26/2023 0540   GLUCOSE 97 05/08/2014 0151   BUN 12 11/26/2023 0540   BUN 16 05/08/2014 0151   CREATININE 0.75 11/26/2023 0540   CREATININE 1.25 05/08/2014 0151   CALCIUM  9.4 11/26/2023 0540   CALCIUM  8.8 05/08/2014 0151   GFRNONAA >60 11/26/2023 0540   GFRNONAA 46 (L) 05/08/2014 0151   GFRAA 56 (L) 04/23/2018 0901   GFRAA 54 (L) 05/08/2014 0151   CrCl cannot be calculated (Patient's most recent lab result is older than the maximum 21 days allowed.).  COAG Lab Results  Component Value Date   INR 1.0 10/03/2023    Radiology No results found.   Assessment/Plan 1. Atherosclerosis of native artery of both lower extremities with intermittent claudication (HCC) (Primary)  Recommend:  The patient has evidence of atherosclerosis of the lower extremities with claudication.  The patient does not voice lifestyle limiting  changes at this point in time.  Noninvasive studies do not suggest clinically significant change.  No invasive studies, angiography or surgery at this time The patient should continue walking and begin a more formal exercise program.  The patient should continue antiplatelet therapy and aggressive treatment of the lipid abnormalities  No changes in the patient's medications at this time  Continued surveillance is indicated as atherosclerosis is likely to progress with time.    The patient will continue follow up with noninvasive studies as ordered.  - VAS US  ABI WITH/WO TBI; Future  2. Celiac artery stenosis (HCC) Recommend:  The patient has evidence of chronic asymptomatic mesenteric atherosclerosis.  The patient denies lifestyle limiting changes at this point in time.  Given the lack of symptoms no intervention is warranted at this time.   No further invasive studies, angiography or surgery at this time  The patient should continue walking and begin a more formal exercise program.   The patient should continue antiplatelet therapy and aggressive treatment of the lipid abnormalities  Patient should undergo noninvasive studies as ordered. The patient will follow up with me after the studies.  - VAS US  MESENTERIC; Future  3. Essential hypertension Continue antihypertensive medications as already ordered, these medications have been reviewed and there are no changes at this time.  4. Gastroesophageal reflux disease, unspecified whether esophagitis present Continue PPI as already ordered, this medication has been reviewed and there are no changes at this time.  Avoidence of caffeine and alcohol  Moderate elevation of the head of the bed   5. Mixed hyperlipidemia Continue statin as ordered and reviewed, no changes at this time    Cordella Shawl, MD  04/29/2024 2:28 PM

## 2024-04-30 ENCOUNTER — Telehealth (INDEPENDENT_AMBULATORY_CARE_PROVIDER_SITE_OTHER): Payer: Self-pay | Admitting: Vascular Surgery

## 2024-04-30 ENCOUNTER — Encounter (INDEPENDENT_AMBULATORY_CARE_PROVIDER_SITE_OTHER): Payer: Self-pay | Admitting: Vascular Surgery

## 2024-04-30 ENCOUNTER — Ambulatory Visit (INDEPENDENT_AMBULATORY_CARE_PROVIDER_SITE_OTHER)

## 2024-04-30 ENCOUNTER — Ambulatory Visit (INDEPENDENT_AMBULATORY_CARE_PROVIDER_SITE_OTHER): Admitting: Vascular Surgery

## 2024-04-30 VITALS — BP 141/83 | HR 65 | Ht 69.0 in | Wt 230.4 lb

## 2024-04-30 DIAGNOSIS — I70213 Atherosclerosis of native arteries of extremities with intermittent claudication, bilateral legs: Secondary | ICD-10-CM

## 2024-04-30 DIAGNOSIS — I774 Celiac artery compression syndrome: Secondary | ICD-10-CM | POA: Diagnosis not present

## 2024-04-30 DIAGNOSIS — I10 Essential (primary) hypertension: Secondary | ICD-10-CM | POA: Diagnosis not present

## 2024-04-30 DIAGNOSIS — K219 Gastro-esophageal reflux disease without esophagitis: Secondary | ICD-10-CM | POA: Diagnosis not present

## 2024-04-30 DIAGNOSIS — E782 Mixed hyperlipidemia: Secondary | ICD-10-CM

## 2024-04-30 NOTE — Telephone Encounter (Signed)
 Pt will call later to make her 1 year appointment at [facility]  Fu 1 year + ABI + Mesenteric see GS/FB

## 2024-05-01 LAB — VAS US ABI WITH/WO TBI
Left ABI: 1.1
Right ABI: >1

## 2024-05-05 DIAGNOSIS — I774 Celiac artery compression syndrome: Secondary | ICD-10-CM | POA: Insufficient documentation

## 2024-06-29 ENCOUNTER — Other Ambulatory Visit: Payer: Self-pay | Admitting: Gastroenterology

## 2024-06-29 DIAGNOSIS — R1013 Epigastric pain: Secondary | ICD-10-CM

## 2024-06-29 DIAGNOSIS — K219 Gastro-esophageal reflux disease without esophagitis: Secondary | ICD-10-CM

## 2024-07-03 ENCOUNTER — Other Ambulatory Visit: Payer: Self-pay

## 2024-07-03 ENCOUNTER — Emergency Department

## 2024-07-03 ENCOUNTER — Emergency Department: Admission: EM | Admit: 2024-07-03 | Discharge: 2024-07-03 | Disposition: A | Discharge status: Home / Self Care

## 2024-07-03 DIAGNOSIS — R079 Chest pain, unspecified: Secondary | ICD-10-CM | POA: Diagnosis not present

## 2024-07-03 DIAGNOSIS — Z955 Presence of coronary angioplasty implant and graft: Secondary | ICD-10-CM | POA: Diagnosis not present

## 2024-07-03 DIAGNOSIS — R1084 Generalized abdominal pain: Secondary | ICD-10-CM | POA: Diagnosis present

## 2024-07-03 DIAGNOSIS — I10 Essential (primary) hypertension: Secondary | ICD-10-CM | POA: Diagnosis not present

## 2024-07-03 DIAGNOSIS — R109 Unspecified abdominal pain: Secondary | ICD-10-CM

## 2024-07-03 LAB — COMPREHENSIVE METABOLIC PANEL WITH GFR
ALT: 16 U/L (ref 0–44)
AST: 23 U/L (ref 15–41)
Albumin: 3.7 g/dL (ref 3.5–5.0)
Alkaline Phosphatase: 44 U/L (ref 38–126)
Anion gap: 12 (ref 5–15)
BUN: 13 mg/dL (ref 8–23)
CO2: 21 mmol/L — ABNORMAL LOW (ref 22–32)
Calcium: 9 mg/dL (ref 8.9–10.3)
Chloride: 105 mmol/L (ref 98–111)
Creatinine, Ser: 1.01 mg/dL — ABNORMAL HIGH (ref 0.44–1.00)
GFR, Estimated: 60 mL/min — ABNORMAL LOW (ref >60)
Glucose, Bld: 93 mg/dL (ref 70–99)
Potassium: 4.1 mmol/L (ref 3.5–5.1)
Sodium: 138 mmol/L (ref 135–145)
Total Bilirubin: 0.5 mg/dL (ref 0.0–1.2)
Total Protein: 7.1 g/dL (ref 6.5–8.1)

## 2024-07-03 LAB — CBC
HCT: 35.6 % — ABNORMAL LOW (ref 36.0–46.0)
Hemoglobin: 11 g/dL — ABNORMAL LOW (ref 12.0–15.0)
MCH: 24.2 pg — ABNORMAL LOW (ref 26.0–34.0)
MCHC: 30.9 g/dL (ref 30.0–36.0)
MCV: 78.4 fL — ABNORMAL LOW (ref 80.0–100.0)
Platelets: 280 K/uL (ref 150–400)
RBC: 4.54 MIL/uL (ref 3.87–5.11)
RDW: 15.3 % (ref 11.5–15.5)
WBC: 6.3 K/uL (ref 4.0–10.5)
nRBC: 0 % (ref 0.0–0.2)

## 2024-07-03 LAB — TROPONIN I (HIGH SENSITIVITY)
Troponin I (High Sensitivity): 3 ng/L (ref <18)
Troponin I (High Sensitivity): 3 ng/L (ref <18)

## 2024-07-03 LAB — LIPASE, BLOOD: Lipase: 16 U/L (ref 11–51)

## 2024-07-03 MED ORDER — MORPHINE SULFATE (PF) 4 MG/ML IV SOLN
4.0000 mg | Freq: Once | INTRAVENOUS | Status: AC
  Administered 2024-07-03: 4 mg via INTRAVENOUS
  Filled 2024-07-03: qty 1

## 2024-07-03 MED ORDER — OXYCODONE-ACETAMINOPHEN 5-325 MG PO TABS: 1.0000 | ORAL_TABLET | Freq: Four times a day (QID) | ORAL | 0 refills | Status: DC | PRN

## 2024-07-03 MED ORDER — ONDANSETRON HCL 4 MG/2ML IJ SOLN
4.0000 mg | Freq: Once | INTRAMUSCULAR | Status: AC
  Administered 2024-07-03: 4 mg via INTRAVENOUS
  Filled 2024-07-03: qty 2

## 2024-07-03 MED ORDER — OXYCODONE-ACETAMINOPHEN 5-325 MG PO TABS
1.0000 | ORAL_TABLET | Freq: Once | ORAL | Status: AC
  Administered 2024-07-03: 1 via ORAL
  Filled 2024-07-03: qty 1

## 2024-07-03 MED ORDER — FAMOTIDINE IN NACL 20-0.9 MG/50ML-% IV SOLN
20.0000 mg | Freq: Once | INTRAVENOUS | Status: AC
  Administered 2024-07-03: 20 mg via INTRAVENOUS
  Filled 2024-07-03: qty 50

## 2024-07-03 NOTE — ED Notes (Signed)
 Pt stating that her daughter is coming to pick her up. Pt ambulatory with steady gait. Pt A&Ox4.

## 2024-07-03 NOTE — ED Provider Notes (Signed)
 [facility] Provider Note    Event Date/Time   First MD Initiated Contact with Patient 07/03/24 1104     (approximate)   History   Chest Pain   HPI  Teresa Sparks is a 73 y.o. female  celiac artery stenosis with stent placement, chronic abdominal pain, htn, claudication (followed by vascular surgery) who presents with 3 weeks of intermittent generalized abdominal pain and pain that radiates up through her chest and into her neck.  Patient reports compliance of all of her medications.  States that her last bowel movement was yesterday which was normal.  Denies any shortness of breath.  Reports regular follow-up with her primary care physician but states that she cannot follow-up with her gastroenterologist till April of next year.  Denies any nausea or vomiting.  She does tell me that she did have a fall from bed 3 nights ago after a bad dream on her left side and has had subsequent left shoulder pain.      Physical Exam   Triage Vital Signs: ED Triage Vitals  Encounter Vitals Group     BP 07/03/24 1051 (!) 172/80     Girls Systolic BP Percentile --      Girls Diastolic BP Percentile --      Boys Systolic BP Percentile --      Boys Diastolic BP Percentile --      Pulse Rate 07/03/24 1049 70     Resp 07/03/24 1049 18     Temp 07/03/24 1049 98.8 F (37.1 C)     Temp Source 07/03/24 1049 Oral     SpO2 07/03/24 1049 96 %     Weight 07/03/24 1050 229 lb (103.9 kg)     Height 07/03/24 1050 5' 9 (1.753 m)     Head Circumference --      Peak Flow --      Pain Score 07/03/24 1050 9     Pain Loc --      Pain Education --      Exclude from Growth Chart --     Most recent vital signs: Vitals:   07/03/24 1500 07/03/24 1530  BP: (!) 169/74 (!) 171/72  Pulse: (!) 57 (!) 53  Resp: 18   Temp: 97.7 F (36.5 C)   SpO2: 99% 100%    Nursing Triage Note reviewed. Vital signs reviewed and patients oxygen saturation is normoxic  General: Patient is  well nourished, well developed, awake and alert, resting comfortably in no acute distress Head: Normocephalic and atraumatic Eyes: Normal inspection, extraocular muscles intact, no conjunctival pallor Ear, nose, throat: Normal external exam Neck: Normal range of motion Respiratory: Patient is in no respiratory distress, lungs CTAB Cardiovascular: Patient is not tachycardic, RRR without murmur appreciated GI: Abd soft, mild tenderness to palpation in the left upper quadrant and right upper quadrants, no rebound or guarding Back: Normal inspection of the back with good strength and range of motion throughout all ext Extremities: pulses intact with good cap refills, no LE pitting edema or calf tenderness Neuro: The patient is alert and oriented to person, place, and time, appropriately conversive, with 5/5 bilat UE/LE strength, no gross motor or sensory defects noted. Coordination appears to be adequate. Skin: Warm, dry, and intact Psych: normal mood and affect, no SI or HI  ED Results / Procedures / Treatments   Labs (all labs ordered are listed, but only abnormal results are displayed) Labs Reviewed  CBC - Abnormal; Notable for the  following components:      Result Value   Hemoglobin 11.0 (*)    HCT 35.6 (*)    MCV 78.4 (*)    MCH 24.2 (*)    All other components within normal limits  COMPREHENSIVE METABOLIC PANEL WITH GFR - Abnormal; Notable for the following components:   CO2 21 (*)    Creatinine, Ser 1.01 (*)    GFR, Estimated 60 (*)    All other components within normal limits  LIPASE, BLOOD  TROPONIN I (HIGH SENSITIVITY)  TROPONIN I (HIGH SENSITIVITY)     EKG EKG and rhythm strip are interpreted by myself:   EKG: [Normal sinus rhythm] at heart rate of 73, normal QRS duration, QTc 469, nonspecific ST segments and T waves no ectopy EKG not consistent with Acute STEMI Rhythm strip: NSR in lead II   RADIOLOGY CT chest abd and pelvis: No acute abnormalities on my  independent review interpretation, radiologist read this as concern for possible bronchogenic carcinoma    PROCEDURES:  Critical Care performed: No  Procedures   MEDICATIONS ORDERED IN ED: Medications  morphine  (PF) 4 MG/ML injection 4 mg (4 mg Intravenous Given 07/03/24 1154)  ondansetron  (ZOFRAN ) injection 4 mg (4 mg Intravenous Given 07/03/24 1154)  famotidine  (PEPCID ) IVPB 20 mg premix (0 mg Intravenous Stopped 07/03/24 1415)  oxyCODONE -acetaminophen  (PERCOCET/ROXICET) 5-325 MG per tablet 1 tablet (1 tablet Oral Given 07/03/24 1459)     IMPRESSION / MDM / ASSESSMENT AND PLAN / ED COURSE                                Differential diagnosis includes, but is not limited to, ACS, gastritis, musculoskeletal, SBO, electro derangement  ED course: Patient presents and EKG demonstrates no evidence of acute ischemia and patient's abdomen demonstrates no evidence of peritonitis.  Reassured by chronicity of symptoms.  Troponins x 2 was not elevated.  She had no leukocytosis or elevation in her LFTs or electrolyte derangements.  Patient unfortunately has an anaphylactic reaction to contrast IV.  CT chest abdomen pelvis without contrast was completed instead which demonstrated no acute abnormality except for possible bronchogenic carcinoma.  Patient was aware of this and actually had these lesions biopsied by pulmonology last year.  I did print out a copy of the report for her.  Patient was able to tolerate p.o. and felt actually improved after a dose of Percocet.  Will send a short course of this for her to her pharmacy of choice.  She was encouraged to follow-up with primary care physician and gastroenterologist as soon as possible.  All questions answered and patient voiced understanding and requested discharge   Clinical Course as of 07/03/24 1649  Tue Jul 03, 2024  1157 WBC: 6.3 No leukocytosis [HD]  1241 Troponin I (High Sensitivity): 3 Not elevated [HD]  1521 DG Shoulder Left No acute  abnormality [HD]    Clinical Course User Index [HD] Nicholaus Rolland BRAVO, MD   At time of discharge there is no evidence of acute life, limb, vision, or fertility threat. Patient has stable vital signs, pain is well controlled, patient is ambulatory and p.o. tolerant.  Discharge instructions were completed using the EPIC system. I would refer you to those at this time. All warnings prescriptions follow-up etc. were discussed in detail with the patient. Patient indicates understanding and is agreeable with this plan. All questions answered.  Patient is made aware that they may return  to the emergency department for any worsening or new condition or for any other emergency.  -- Risk: 5 This patient has a high risk of morbidity due to further diagnostic testing or treatment. Rationale: This patient's evaluation and management involve a high risk of morbidity due to the potential severity of presenting symptoms, need for diagnostic testing, and/or initiation of treatment that may require close monitoring. The differential includes conditions with potential for significant deterioration or requiring escalation of care. Treatment decisions in the ED, including medication administration, procedural interventions, or disposition planning, reflect this level of risk. COPA: 5 The patient has the following acute or chronic illness/injury that poses a possible threat to life or bodily function: [X] : The patient has a potentially serious acute condition or an acute exacerbation of a chronic illness requiring urgent evaluation and management in the Emergency Department. The clinical presentation necessitates immediate consideration of life-threatening or function-threatening diagnoses, even if they are ultimately ruled out.   FINAL CLINICAL IMPRESSION(S) / ED DIAGNOSES   Final diagnoses:  Chest pain, unspecified type  Abdominal pain, unspecified abdominal location     Rx / DC Orders   ED Discharge Orders           Ordered    oxyCODONE -acetaminophen  (PERCOCET) 5-325 MG tablet  Every 6 hours PRN        07/03/24 1436    Ambulatory referral to Cardiology       Comments: If you have not heard from the Cardiology office within the next 72 hours please call [phone].   07/03/24 1522             Note:  This document was prepared using Dragon voice recognition software and may include unintentional dictation errors.   Nicholaus Rolland BRAVO, MD 07/03/24 [phone]

## 2024-07-03 NOTE — ED Triage Notes (Signed)
 Patient states mid sternal chest pain that radiates between shoulder blades, also complaining of pain to left arm x 3 weeks.

## 2024-07-03 NOTE — Discharge Instructions (Signed)
 Please stick to your regular medications.  Do not drive or operate machinery if you are taking your Percocet.  Please call and make an appointment with your primary care physician.  Please follow-up with by a GI specialist as soon as possible.  Return with any acutely worsening symptoms or any other emergency. -- RETURN PRECAUTIONS & AFTERCARE: (ENGLISH) RETURN PRECAUTIONS: Return immediately to the emergency department or see/call your doctor if you feel worse, weak or have changes in speech or vision, are short of breath, have fever, vomiting, pain, bleeding or dark stool, trouble urinating or any new issues. Return here or see/call your doctor if not improving as expected for your suspected condition. FOLLOW-UP CARE: Call your doctor and/or any doctors we referred you to for more advice and to make an appointment. Do this today, tomorrow or after the weekend. Some doctors only take PPO insurance so if you have HMO insurance you may want to contact your HMO or your regular doctor for referral to a specialist within your plan. Either way tell the doctor's office that it was a referral from the emergency department so you get the soonest possible appointment.  YOUR TEST RESULTS: Take result reports of any blood or urine tests, imaging tests and EKG's to your doctor and any referral doctor. Have any abnormal tests repeated. Your doctor or a referral doctor can let you know when this should be done. Also make sure your doctor contacts this hospital to get any test results that are not currently available such as cultures or special tests for infection and final imaging reports, which are often not available at the time you leave the ER but which may list additional important findings that are not documented on the preliminary report. BLOOD PRESSURE: If your blood pressure was greater than 120/80 have your blood pressure rechecked within 1 to 2 weeks. MEDICATION SIDE EFFECTS: Do not drive, walk, bike, take the  bus, etc. if you have received or are being prescribed any sedating medications such as those for pain or anxiety or certain antihistamines like Benadryl . If you have been give one of these here get a taxi home or have a friend drive you home. Ask your pharmacist to counsel you on potential side effects of any new medication

## 2024-07-05 ENCOUNTER — Encounter: Payer: Self-pay | Admitting: Emergency Medicine

## 2024-07-05 ENCOUNTER — Observation Stay
Admission: EM | Admit: 2024-07-05 | Discharge: 2024-07-07 | Disposition: A | Discharge status: Home / Self Care | Attending: Vascular Surgery | Admitting: Vascular Surgery

## 2024-07-05 ENCOUNTER — Other Ambulatory Visit: Payer: Self-pay

## 2024-07-05 DIAGNOSIS — I7779 Dissection of other artery: Secondary | ICD-10-CM | POA: Insufficient documentation

## 2024-07-05 DIAGNOSIS — Z87891 Personal history of nicotine dependence: Secondary | ICD-10-CM | POA: Insufficient documentation

## 2024-07-05 DIAGNOSIS — Z79899 Other long term (current) drug therapy: Secondary | ICD-10-CM | POA: Diagnosis not present

## 2024-07-05 DIAGNOSIS — Y831 Surgical operation with implant of artificial internal device as the cause of abnormal reaction of the patient, or of later complication, without mention of misadventure at the time of the procedure: Secondary | ICD-10-CM | POA: Diagnosis not present

## 2024-07-05 DIAGNOSIS — E785 Hyperlipidemia, unspecified: Secondary | ICD-10-CM | POA: Insufficient documentation

## 2024-07-05 DIAGNOSIS — Z91041 Radiographic dye allergy status: Secondary | ICD-10-CM | POA: Diagnosis not present

## 2024-07-05 DIAGNOSIS — R100 Acute abdomen: Secondary | ICD-10-CM | POA: Diagnosis present

## 2024-07-05 DIAGNOSIS — G4730 Sleep apnea, unspecified: Secondary | ICD-10-CM | POA: Diagnosis not present

## 2024-07-05 DIAGNOSIS — R1013 Epigastric pain: Secondary | ICD-10-CM | POA: Diagnosis not present

## 2024-07-05 DIAGNOSIS — K219 Gastro-esophageal reflux disease without esophagitis: Secondary | ICD-10-CM | POA: Diagnosis not present

## 2024-07-05 DIAGNOSIS — Z7982 Long term (current) use of aspirin: Secondary | ICD-10-CM | POA: Insufficient documentation

## 2024-07-05 DIAGNOSIS — I774 Celiac artery compression syndrome: Secondary | ICD-10-CM | POA: Insufficient documentation

## 2024-07-05 DIAGNOSIS — I2510 Atherosclerotic heart disease of native coronary artery without angina pectoris: Secondary | ICD-10-CM | POA: Insufficient documentation

## 2024-07-05 DIAGNOSIS — R109 Unspecified abdominal pain: Secondary | ICD-10-CM | POA: Insufficient documentation

## 2024-07-05 DIAGNOSIS — I10 Essential (primary) hypertension: Secondary | ICD-10-CM | POA: Insufficient documentation

## 2024-07-05 DIAGNOSIS — T82856A Stenosis of peripheral vascular stent, initial encounter: Secondary | ICD-10-CM | POA: Diagnosis not present

## 2024-07-05 DIAGNOSIS — R1010 Upper abdominal pain, unspecified: Principal | ICD-10-CM | POA: Diagnosis present

## 2024-07-05 LAB — CBC WITH DIFFERENTIAL/PLATELET
Abs Immature Granulocytes: 0.03 K/uL (ref 0.00–0.07)
Basophils Absolute: 0 K/uL (ref 0.0–0.1)
Basophils Relative: 1 %
Eosinophils Absolute: 0.1 K/uL (ref 0.0–0.5)
Eosinophils Relative: 1 %
HCT: 38.6 % (ref 36.0–46.0)
Hemoglobin: 12.1 g/dL (ref 12.0–15.0)
Immature Granulocytes: 0 %
Lymphocytes Relative: 25 %
Lymphs Abs: 2 K/uL (ref 0.7–4.0)
MCH: 24.4 pg — ABNORMAL LOW (ref 26.0–34.0)
MCHC: 31.3 g/dL (ref 30.0–36.0)
MCV: 77.8 fL — ABNORMAL LOW (ref 80.0–100.0)
Monocytes Absolute: 0.5 K/uL (ref 0.1–1.0)
Monocytes Relative: 7 %
Neutro Abs: 5.3 K/uL (ref 1.7–7.7)
Neutrophils Relative %: 66 %
Platelets: 313 K/uL (ref 150–400)
RBC: 4.96 MIL/uL (ref 3.87–5.11)
RDW: 15.3 % (ref 11.5–15.5)
WBC: 8.1 K/uL (ref 4.0–10.5)
nRBC: 0 % (ref 0.0–0.2)

## 2024-07-05 LAB — CBC
HCT: 38.7 % (ref 36.0–46.0)
Hemoglobin: 12 g/dL (ref 12.0–15.0)
MCH: 24.2 pg — ABNORMAL LOW (ref 26.0–34.0)
MCHC: 31 g/dL (ref 30.0–36.0)
MCV: 78.2 fL — ABNORMAL LOW (ref 80.0–100.0)
Platelets: 338 K/uL (ref 150–400)
RBC: 4.95 MIL/uL (ref 3.87–5.11)
RDW: 15 % (ref 11.5–15.5)
WBC: 7 K/uL (ref 4.0–10.5)
nRBC: 0 % (ref 0.0–0.2)

## 2024-07-05 LAB — COMPREHENSIVE METABOLIC PANEL WITH GFR
ALT: 63 U/L — ABNORMAL HIGH (ref 0–44)
AST: 31 U/L (ref 15–41)
Albumin: 3.8 g/dL (ref 3.5–5.0)
Alkaline Phosphatase: 47 U/L (ref 38–126)
Anion gap: 8 (ref 5–15)
BUN: 11 mg/dL (ref 8–23)
CO2: 27 mmol/L (ref 22–32)
Calcium: 9.6 mg/dL (ref 8.9–10.3)
Chloride: 102 mmol/L (ref 98–111)
Creatinine, Ser: 1.05 mg/dL — ABNORMAL HIGH (ref 0.44–1.00)
GFR, Estimated: 57 mL/min — ABNORMAL LOW (ref >60)
Glucose, Bld: 97 mg/dL (ref 70–99)
Potassium: 3.7 mmol/L (ref 3.5–5.1)
Sodium: 137 mmol/L (ref 135–145)
Total Bilirubin: 0.6 mg/dL (ref 0.0–1.2)
Total Protein: 7.5 g/dL (ref 6.5–8.1)

## 2024-07-05 LAB — URINALYSIS, ROUTINE W REFLEX MICROSCOPIC
Bilirubin Urine: NEGATIVE
Glucose, UA: NEGATIVE mg/dL
Hgb urine dipstick: NEGATIVE
Ketones, ur: NEGATIVE mg/dL
Leukocytes,Ua: NEGATIVE
Nitrite: NEGATIVE
Protein, ur: NEGATIVE mg/dL
Specific Gravity, Urine: 1.012 (ref 1.005–1.030)
pH: 7 (ref 5.0–8.0)

## 2024-07-05 LAB — PROTIME-INR
INR: 1 (ref 0.8–1.2)
Prothrombin Time: 13.3 s (ref 11.4–15.2)

## 2024-07-05 LAB — LIPASE, BLOOD: Lipase: 17 U/L (ref 11–51)

## 2024-07-05 LAB — LACTIC ACID, PLASMA
Lactic Acid, Venous: 1.1 mmol/L (ref 0.5–1.9)
Lactic Acid, Venous: 1.5 mmol/L (ref 0.5–1.9)

## 2024-07-05 LAB — CREATININE, SERUM
Creatinine, Ser: 0.92 mg/dL (ref 0.44–1.00)
GFR, Estimated: >60 mL/min (ref >60)

## 2024-07-05 LAB — TROPONIN I (HIGH SENSITIVITY)
Troponin I (High Sensitivity): 3 ng/L (ref <18)
Troponin I (High Sensitivity): 2 ng/L (ref <18)

## 2024-07-05 MED ORDER — LORATADINE 10 MG PO TABS
10.0000 mg | ORAL_TABLET | Freq: Every day | ORAL | Status: DC
  Administered 2024-07-07: 10 mg via ORAL
  Filled 2024-07-05 (×3): qty 1

## 2024-07-05 MED ORDER — OXYCODONE HCL 5 MG PO TABS
5.0000 mg | ORAL_TABLET | ORAL | Status: DC | PRN
  Administered 2024-07-05 – 2024-07-06 (×2): 5 mg via ORAL
  Filled 2024-07-05 (×2): qty 1

## 2024-07-05 MED ORDER — SODIUM CHLORIDE 0.9 % IV SOLN: INTRAVENOUS | Status: AC

## 2024-07-05 MED ORDER — ACETAMINOPHEN 500 MG PO TABS
1000.0000 mg | ORAL_TABLET | Freq: Once | ORAL | Status: AC
  Administered 2024-07-05: 1000 mg via ORAL
  Filled 2024-07-05: qty 2

## 2024-07-05 MED ORDER — METHYLPREDNISOLONE SODIUM SUCC 40 MG IJ SOLR
40.0000 mg | Freq: Once | INTRAMUSCULAR | Status: AC
  Administered 2024-07-05: 40 mg via INTRAVENOUS
  Filled 2024-07-05: qty 1

## 2024-07-05 MED ORDER — ATORVASTATIN CALCIUM 10 MG PO TABS
10.0000 mg | ORAL_TABLET | Freq: Every day | ORAL | Status: DC
  Administered 2024-07-06: 10 mg via ORAL
  Filled 2024-07-05 (×2): qty 1

## 2024-07-05 MED ORDER — FERROUS SULFATE 325 (65 FE) MG PO TABS
325.0000 mg | ORAL_TABLET | Freq: Every day | ORAL | Status: DC
  Administered 2024-07-07: 325 mg via ORAL
  Filled 2024-07-05: qty 1

## 2024-07-05 MED ORDER — MORPHINE SULFATE (PF) 4 MG/ML IV SOLN
4.0000 mg | Freq: Once | INTRAVENOUS | Status: AC
  Administered 2024-07-05: 4 mg via INTRAVENOUS
  Filled 2024-07-05: qty 1

## 2024-07-05 MED ORDER — ASPIRIN 81 MG PO CHEW
81.0000 mg | CHEWABLE_TABLET | Freq: Every day | ORAL | Status: DC
  Administered 2024-07-07: 81 mg via ORAL
  Filled 2024-07-05: qty 1

## 2024-07-05 MED ORDER — ONDANSETRON HCL 4 MG/2ML IJ SOLN
4.0000 mg | Freq: Once | INTRAMUSCULAR | Status: AC
  Administered 2024-07-05: 4 mg via INTRAVENOUS
  Filled 2024-07-05: qty 2

## 2024-07-05 MED ORDER — ALUM & MAG HYDROXIDE-SIMETH 200-200-20 MG/5ML PO SUSP
30.0000 mL | Freq: Once | ORAL | Status: DC
Start: 2024-07-05 — End: 2024-07-07
  Filled 2024-07-05: qty 30

## 2024-07-05 MED ORDER — DIPHENHYDRAMINE HCL 25 MG PO CAPS
50.0000 mg | ORAL_CAPSULE | Freq: Once | ORAL | Status: AC
  Administered 2024-07-06: 50 mg via ORAL
  Filled 2024-07-05: qty 2

## 2024-07-05 MED ORDER — ONDANSETRON HCL 4 MG/2ML IJ SOLN: 4.0000 mg | Freq: Four times a day (QID) | INTRAMUSCULAR | Status: DC | PRN

## 2024-07-05 MED ORDER — ACETAMINOPHEN 325 MG PO TABS: 650.0000 mg | ORAL_TABLET | Freq: Four times a day (QID) | ORAL | Status: DC | PRN

## 2024-07-05 MED ORDER — LIDOCAINE VISCOUS HCL 2 % MT SOLN
15.0000 mL | Freq: Once | OROMUCOSAL | Status: AC
  Administered 2024-07-05: 15 mL via ORAL
  Filled 2024-07-05: qty 15

## 2024-07-05 MED ORDER — SUCRALFATE 1 G PO TABS
1.0000 g | ORAL_TABLET | Freq: Four times a day (QID) | ORAL | Status: DC
  Administered 2024-07-05 – 2024-07-07 (×4): 1 g via ORAL
  Filled 2024-07-05 (×5): qty 1

## 2024-07-05 MED ORDER — HYDRALAZINE HCL 50 MG PO TABS
50.0000 mg | ORAL_TABLET | Freq: Two times a day (BID) | ORAL | Status: DC
  Administered 2024-07-05 – 2024-07-07 (×3): 50 mg via ORAL
  Filled 2024-07-05 (×3): qty 1

## 2024-07-05 MED ORDER — ENOXAPARIN SODIUM 40 MG/0.4ML IJ SOSY: 40.0000 mg | PREFILLED_SYRINGE | INTRAMUSCULAR | Status: DC

## 2024-07-05 MED ORDER — ONDANSETRON HCL 4 MG PO TABS: 4.0000 mg | ORAL_TABLET | Freq: Four times a day (QID) | ORAL | Status: DC | PRN

## 2024-07-05 MED ORDER — DIPHENHYDRAMINE HCL 50 MG/ML IJ SOLN
50.0000 mg | Freq: Once | INTRAMUSCULAR | Status: AC
  Filled 2024-07-05: qty 1

## 2024-07-05 MED ORDER — ACETAMINOPHEN 650 MG RE SUPP: 650.0000 mg | Freq: Four times a day (QID) | RECTAL | Status: DC | PRN

## 2024-07-05 MED ORDER — MORPHINE SULFATE (PF) 2 MG/ML IV SOLN
2.0000 mg | INTRAVENOUS | Status: DC | PRN
  Administered 2024-07-05: 2 mg via INTRAVENOUS
  Filled 2024-07-05: qty 1

## 2024-07-05 MED ORDER — MORPHINE SULFATE (PF) 4 MG/ML IV SOLN: 4.0000 mg | Freq: Once | INTRAVENOUS | Status: DC

## 2024-07-05 MED ORDER — ENOXAPARIN SODIUM 60 MG/0.6ML IJ SOSY
50.0000 mg | PREFILLED_SYRINGE | INTRAMUSCULAR | Status: DC
  Administered 2024-07-05 – 2024-07-06 (×2): 50 mg via SUBCUTANEOUS
  Filled 2024-07-05 (×2): qty 0.6

## 2024-07-05 MED ORDER — PAROXETINE HCL 10 MG PO TABS
10.0000 mg | ORAL_TABLET | Freq: Every day | ORAL | Status: DC
  Administered 2024-07-06 – 2024-07-07 (×2): 10 mg via ORAL
  Filled 2024-07-05 (×2): qty 1

## 2024-07-05 MED ORDER — METOPROLOL SUCCINATE ER 25 MG PO TB24
25.0000 mg | ORAL_TABLET | Freq: Every day | ORAL | Status: DC
  Administered 2024-07-06 – 2024-07-07 (×2): 25 mg via ORAL
  Filled 2024-07-05 (×2): qty 1

## 2024-07-05 MED ORDER — FAMOTIDINE IN NACL 20-0.9 MG/50ML-% IV SOLN
20.0000 mg | Freq: Once | INTRAVENOUS | Status: AC
  Administered 2024-07-05: 20 mg via INTRAVENOUS
  Filled 2024-07-05: qty 50

## 2024-07-05 MED ORDER — POLYETHYLENE GLYCOL 3350 17 G PO PACK: 17.0000 g | PACK | Freq: Every day | ORAL | Status: DC | PRN

## 2024-07-05 MED ORDER — FAMOTIDINE 20 MG PO TABS
40.0000 mg | ORAL_TABLET | Freq: Two times a day (BID) | ORAL | Status: DC
  Administered 2024-07-05 – 2024-07-07 (×3): 40 mg via ORAL
  Filled 2024-07-05 (×3): qty 2

## 2024-07-05 MED ORDER — ALUM & MAG HYDROXIDE-SIMETH 200-200-20 MG/5ML PO SUSP
30.0000 mL | Freq: Once | ORAL | Status: AC
  Administered 2024-07-05: 30 mL via ORAL
  Filled 2024-07-05: qty 30

## 2024-07-05 MED ORDER — HYDRALAZINE HCL 20 MG/ML IJ SOLN
10.0000 mg | Freq: Four times a day (QID) | INTRAMUSCULAR | Status: DC | PRN
  Administered 2024-07-05: 10 mg via INTRAVENOUS
  Filled 2024-07-05 (×2): qty 1

## 2024-07-05 MED ORDER — PREDNISONE 20 MG PO TABS
50.0000 mg | ORAL_TABLET | Freq: Four times a day (QID) | ORAL | Status: AC
  Administered 2024-07-05 – 2024-07-06 (×3): 50 mg via ORAL
  Filled 2024-07-05 (×2): qty 1
  Filled 2024-07-05: qty 3

## 2024-07-05 NOTE — ED Triage Notes (Addendum)
 Pt states she is having upper abd pain and lower back pain. Pt unable to get in to see GI doc today. Endorses nausea with no vomiting.  Pt requests to see doctor before any labs drawn.

## 2024-07-05 NOTE — H&P (Signed)
 History and Physical    Teresa Sparks FMW:[id number] DOB: Mar 21, 1951 DOA: 07/05/2024  DOS: the patient was seen and examined on 07/05/2024  PCP: Stanton Lynwood FALCON, MD   Patient coming from: Home  I have personally briefly reviewed patient's old medical records in [facility]  Chief Complaint: Abdominal and back pain 3 weeks  HPI: Teresa Sparks is a pleasant 73 y.o. female with medical history significant for celiac artery stenosis s/p stent, chronic abdominal pain, HTN, GERD, sleep apnea, claudication who presented to ED complaining of abdominal pain for 3 weeks getting worse and last 3 days.  Patient stated that she is having upper abdominal pain and lower back pain.  She stated that epigastric abdominal pain is 10/10 intensity, sharp, radiating to the back, getting better with the pain medications.  Unable to get into see a GI doctor today.  Endorses some nausea but no vomiting.  Requested to see the doctor before any labs are drawn.  Patient had bowel movement 3 days ago. She denies any fever, chills, hematemesis, melena.  She came into ED on 07/03/2024 for similar symptoms and she was discharged home after she felt better when she was given morphine , Zofran , famotidine  and oxycodone .  ED Course: Upon arrival to the ED, patient is found to be in severe pain, EKG showed sinus bradycardia at 59 bpm, no ST elevation.  Vascular surgery was consulted and they advised for CT angio of the celiac artery.  She has a contrast allergy.  She has been started on 13-hour steroid protocol for allergy.  That will be done after midnight.  Vascular team advised to admit her to the hospital and get that done and they will review the images.  Hospitalist service was consulted for evaluation for admission for CT angio of the abdomen.  Review of Systems:  ROS  All other systems negative except as noted in the HPI.  Past Medical History:  Diagnosis Date   Adenomatous colon polyp    Adrenal  adenoma    Allergic genetic state    Arthritis    Chronic abdominal pain    GERD (gastroesophageal reflux disease)    Hiatal hernia    Hypertension    Sleep apnea     Past Surgical History:  Procedure Laterality Date   ABDOMINAL HYSTERECTOMY     CHOLECYSTECTOMY     COLONOSCOPY     COLONOSCOPY WITH PROPOFOL  N/A 07/24/2018   Procedure: COLONOSCOPY WITH PROPOFOL ;  Surgeon: Viktoria Lamar DASEN, MD;  Location: [facility] ENDOSCOPY;  Service: Endoscopy;  Laterality: N/A;   COLONOSCOPY WITH PROPOFOL  N/A 11/23/2023   Procedure: COLONOSCOPY WITH PROPOFOL ;  Surgeon: Toledo, Ladell POUR, MD;  Location: [facility] ENDOSCOPY;  Service: Gastroenterology;  Laterality: N/A;   ENDOBRONCHIAL ULTRASOUND Bilateral 09/30/2023   Procedure: ENDOBRONCHIAL ULTRASOUND;  Surgeon: Tamea Dedra CROME, MD;  Location: [facility] ORS;  Service: Pulmonary;  Laterality: Bilateral;   ESOPHAGOGASTRODUODENOSCOPY (EGD) WITH PROPOFOL  N/A 11/23/2023   Procedure: ESOPHAGOGASTRODUODENOSCOPY (EGD) WITH PROPOFOL ;  Surgeon: Toledo, Ladell POUR, MD;  Location: [facility] ENDOSCOPY;  Service: Gastroenterology;  Laterality: N/A;   FRACTURE SURGERY     HEMOSTASIS CLIP PLACEMENT  11/23/2023   Procedure: HEMOSTASIS CLIP PLACEMENT;  Surgeon: Aundria, Teodoro K, MD;  Location: [facility] ENDOSCOPY;  Service: Gastroenterology;;   HOT HEMOSTASIS  11/23/2023   Procedure: HOT HEMOSTASIS (ARGON PLASMA COAGULATION/BICAP);  Surgeon: Toledo, Teodoro K, MD;  Location: [facility] ENDOSCOPY;  Service: Gastroenterology;;   JOINT REPLACEMENT Right    6 years ago   POLYPECTOMY  11/23/2023   Procedure: POLYPECTOMY;  Surgeon: Aundria, Ladell POUR, MD;  Location: [facility] ENDOSCOPY;  Service: Gastroenterology;;   VISCERAL ANGIOGRAPHY N/A 02/05/2022   Procedure: VISCERAL ANGIOGRAPHY;  Surgeon: Jama Cordella MATSU, MD;  Location: [facility] INVASIVE CV LAB;  Service: Cardiovascular;  Laterality: N/A;   VISCERAL ANGIOGRAPHY N/A 10/04/2023   Procedure: VISCERAL ANGIOGRAPHY;  Surgeon: Jama Cordella MATSU, MD;  Location:  [facility] INVASIVE CV LAB;  Service: Cardiovascular;  Laterality: N/A;   VISCERAL ANGIOGRAPHY N/A 11/25/2023   Procedure: VISCERAL ANGIOGRAPHY;  Surgeon: Jama Cordella MATSU, MD;  Location: [facility] INVASIVE CV LAB;  Service: Cardiovascular;  Laterality: N/A;     reports that she has quit smoking. Her smoking use included cigarettes. She started smoking about 54 years ago. She has a 0.5 pack-year smoking history. Her smokeless tobacco use includes snuff. She reports that she does not drink alcohol and does not use drugs.  Allergies  Allergen Reactions   Iodinated Contrast Media Anaphylaxis    Other reaction(s): Vomiting   Ciprofloxacin Diarrhea and Nausea Only    Nausea, vomiting  Other reaction(s): Vomiting   Codeine Sulfate [Codeine] Diarrhea    Nausea, vomiting   Meloxicam    Penicillins Nausea Only   Protonix  [Pantoprazole  Sodium] Hives   Shellfish Allergy Swelling   Sulfa Antibiotics Rash    Family History  Problem Relation Age of Onset   Hypertension Mother    Stroke Mother    Epilepsy Mother    Colon polyps Sister    Breast cancer Other     Prior to Admission medications   Medication Sig Start Date End Date Taking? Authorizing Provider  acetaminophen  (TYLENOL ) 650 MG CR tablet Take 650 mg by mouth every 8 (eight) hours as needed for pain.   Yes [provider]  aspirin  81 MG chewable tablet Chew by mouth daily.   Yes [provider]  atorvastatin  (LIPITOR) 10 MG tablet Take 10 mg by mouth daily with supper.   Yes [provider]  cetirizine (ZYRTEC) 10 MG tablet Take 10 mg by mouth daily.   Yes [provider]  colchicine  0.6 MG tablet Take 0.6 mg by mouth as needed. 10/14/21  Yes [provider]  esomeprazole  (NEXIUM ) 40 MG capsule Take 40 mg by mouth 2 (two) times daily.   Yes [provider]  famotidine  (PEPCID ) 40 MG tablet Take 40 mg by mouth at bedtime. 06/21/24  Yes [provider]  ferrous sulfate  325 (65 FE) MG  tablet Take 325 mg by mouth daily with breakfast.   Yes [provider]  fluticasone  (FLONASE ) 50 MCG/ACT nasal spray Place 1 spray into both nostrils daily.   Yes [provider]  hydrALAZINE  (APRESOLINE ) 50 MG tablet Take 50 mg by mouth 2 (two) times daily.   Yes [provider]  lisinopril -hydrochlorothiazide  (ZESTORETIC ) 10-12.5 MG tablet Take 1 tablet by mouth daily. 10/13/21  Yes [provider]  metoprolol  succinate (TOPROL -XL) 25 MG 24 hr tablet Take 25 mg by mouth daily. 10/04/22  Yes [provider]  Multiple Vitamins-Minerals (CENTRUM ADULTS PO) Take by mouth daily.   Yes [provider]  omeprazole  (PRILOSEC) 40 MG capsule Take 40 mg by mouth 2 (two) times daily.   Yes [provider]  oxyCODONE -acetaminophen  (PERCOCET) 5-325 MG tablet Take 1 tablet by mouth every 6 (six) hours as needed for up to 3 days for severe pain (pain score 7-10). 07/03/24 07/06/24 Yes Nicholaus Rolland BRAVO, MD  PARoxetine  (PAXIL ) 10 MG tablet Take 1  tablet by mouth daily at 6 (six) AM. 11/06/21  Yes [provider]  sucralfate (CARAFATE) 1 g tablet Take 1 g by mouth 4 (four) times daily. 06/21/24  Yes [provider]  DULoxetine  (CYMBALTA ) 30 MG capsule Take 30 mg by mouth daily.    [provider]    Physical Exam: Vitals:   07/05/24 1300 07/05/24 1330 07/05/24 1345 07/05/24 1423  BP:    (!) 161/80  Pulse: 62 67 68 69  Resp:    18  Temp:    98.1 F (36.7 C)  TempSrc:    Oral  SpO2: 97% 98% 99% 95%  Weight:      Height:        Physical Exam   Constitutional: Alert, awake, calm, comfortable HEENT: Neck supple Respiratory: Clear to auscultation B/L, no wheezing, no rales.  Cardiovascular: Regular rate and rhythm, no murmurs / rubs / gallops. No extremity edema. 2+ pedal pulses. No carotid bruits.  Abdomen: Soft, no tenderness, Bowel sounds positive.  Musculoskeletal: no clubbing / cyanosis. Good ROM, no contractures.  Normal muscle tone.  Skin: no rashes, lesions, ulcers. Neurologic: CN 2-12 grossly intact. Sensation intact, No focal deficit identified Psychiatric: Alert and oriented x 3. Normal mood.    Labs on Admission: I have personally reviewed following labs and imaging studies  CBC: Recent Labs  Lab 07/03/24 1123 07/05/24 1201  WBC 6.3 8.1  NEUTROABS  --  5.3  HGB 11.0* 12.1  HCT 35.6* 38.6  MCV 78.4* 77.8*  PLT 280 313   Basic Metabolic Panel: Recent Labs  Lab 07/03/24 1123 07/05/24 1201  NA 138 137  K 4.1 3.7  CL 105 102  CO2 21* 27  GLUCOSE 93 97  BUN 13 11  CREATININE 1.01* 1.05*  CALCIUM  9.0 9.6   GFR: Estimated Creatinine Clearance: 62.8 mL/min (A) (by C-G formula based on SCr of 1.05 mg/dL (H)). Liver Function Tests: Recent Labs  Lab 07/03/24 1123 07/05/24 1201  AST 23 31  ALT 16 63*  ALKPHOS 44 47  BILITOT 0.5 0.6  PROT 7.1 7.5  ALBUMIN 3.7 3.8   Recent Labs  Lab 07/03/24 1123  LIPASE 16   No results for input(s): AMMONIA in the last 168 hours. Coagulation Profile: Recent Labs  Lab 07/05/24 1201  INR 1.0   Cardiac Enzymes: Recent Labs  Lab 07/03/24 1123 07/03/24 1416 07/05/24 1201 07/05/24 1400  TROPONINIHS 3 3 3 2    BNP (last 3 results) No results for input(s): BNP in the last 8760 hours. HbA1C: No results for input(s): HGBA1C in the last 72 hours. CBG: No results for input(s): GLUCAP in the last 168 hours. Lipid Profile: No results for input(s): CHOL, HDL, LDLCALC, TRIG, CHOLHDL, LDLDIRECT in the last 72 hours. Thyroid Function Tests: No results for input(s): TSH, T4TOTAL, FREET4, T3FREE, THYROIDAB in the last 72 hours. Anemia Panel: No results for input(s): VITAMINB12, FOLATE, FERRITIN, TIBC, IRON, RETICCTPCT in the last 72 hours. Urine analysis:    Component Value Date/Time   COLORURINE YELLOW (A) 07/05/2024 1035   APPEARANCEUR CLEAR (A) 07/05/2024 1035   APPEARANCEUR Clear 05/08/2014  0316   LABSPEC 1.012 07/05/2024 1035   LABSPEC 1.010 05/08/2014 0316   PHURINE 7.0 07/05/2024 1035   GLUCOSEU NEGATIVE 07/05/2024 1035   GLUCOSEU Negative 05/08/2014 0316   HGBUR NEGATIVE 07/05/2024 1035   BILIRUBINUR NEGATIVE 07/05/2024 1035   BILIRUBINUR Negative 05/08/2014 0316   KETONESUR NEGATIVE 07/05/2024 1035   PROTEINUR NEGATIVE 07/05/2024 1035   NITRITE  NEGATIVE 07/05/2024 1035   LEUKOCYTESUR NEGATIVE 07/05/2024 1035   LEUKOCYTESUR Negative 05/08/2014 0316    Radiological Exams on Admission: I have personally reviewed images No results found.  EKG: My personal interpretation of EKG shows: Sinus bradycardia at 59 bpm, no ST elevation    Assessment/Plan Principal Problem:   Abdominal pain Active Problems:   Essential hypertension   Hyperlipidemia   GERD (gastroesophageal reflux disease)   Celiac artery stenosis    Assessment and Plan: 73 year old female with history of celiac artery stenosis with recent stent placement, chronic abdominal pain, HTN, claudication who came into the ED for acute on chronic abdominal pain worsening for the last 3 days.  1.  Acute on chronic abdominal pain recent celiac stenting - She will be placed in observation - Vascular team advised for celiac artery angiogram - Patient has an allergy to contrast, needs 13-hour contrast treatment before procedure. - Patient will be waiting for that procedure until after midnight - Patient will be kept n.p.o. 4 hours before the procedure. - She will be on IV pain medications, IV Pepcid  as she has allergy to Protonix  - Pain medications except hydrochlorothiazide  - IV fluid  2.  HTN, HLD, GERD - Resume her home medications - Continue to monitor blood pressure     DVT prophylaxis: Lovenox  Code Status: Full Code Family Communication: None  Disposition Plan: Home  Consults called: Vascular surgery by EDP  Admission status: Observation, Med-Surg   Nena Rebel, MD [facility] 07/05/2024, 3:55 PM

## 2024-07-05 NOTE — ED Notes (Signed)
 Unsuccessful attempt to place IV. Will defer to another staff member.

## 2024-07-05 NOTE — ED Provider Notes (Signed)
 [facility] Provider Note    Event Date/Time   First MD Initiated Contact with Patient 07/05/24 1108     (approximate)   History   Abdominal Pain and Back Pain  Pt states she is having upper abd pain and lower back pain. Pt unable to get in to see GI doc today. Endorses nausea with no vomiting.  Pt requests to see doctor before any labs drawn.    HPI Teresa Sparks is a 73 y.o. female PMH celiac artery stenosis with stent placement, chronic abdominal pain, hypertension, claudication who presents for evaluation of abdominal pain - Primarily epigastrium.  No nausea/vomiting/diarrhea.  Last bowel movement was about 3 days ago, unremarkable, nonblack/nonbloody says she has not eaten much since then because of decreased appetite due to her upper abdominal pain.  Notes her pain usually goes away with sulcrafate but has been persistent, states 10/10.  Does occasionally radiate to right flank. - Notes she is status post cholecystectomy - Says she wonders if this could be an exacerbation of her GERD - Says pain has been persistent since her recent ED visit on 07/03/2024   Per chart review, last seen in our emergency department on 07/03/2024 complaining of abdominal pain.  CT abdomen pelvis Noncon with no acute pathology.  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  Rx Percocet.  Appears patient has a severe allergy to IV contrast.      Physical Exam   Triage Vital Signs: ED Triage Vitals [07/05/24 1033]  Encounter Vitals Group     BP (!) 132/90     Girls Systolic BP Percentile      Girls Diastolic BP Percentile      Boys Systolic BP Percentile      Boys Diastolic BP Percentile      Pulse Rate 71     Resp 16     Temp 98 F (36.7 C)     Temp Source Oral     SpO2 100 %     Weight 227 lb 1.2 oz (103 kg)     Height 5' 9 (1.753 m)     Head Circumference      Peak Flow      Pain  Score 10     Pain Loc      Pain Education      Exclude from Growth Chart     Most recent vital signs: Vitals:   07/05/24 1033 07/05/24 1105  BP: (!) 132/90 139/64  Pulse: 71 62  Resp: 16 16  Temp: 98 F (36.7 C)   SpO2: 100% 99%     General: Awake, no distress.  CV:  Good peripheral perfusion. RRR, RP 2+ Resp:  Normal effort. CTAB Abd:  No distention.  Notable tenderness in epigastrium and right upper quadrant, no significant tenderness elsewhere throughout abdomen.  No significant CVAT bilaterally.    ED Results / Procedures / Treatments   Labs (all labs ordered are listed, but only abnormal results are displayed) Labs Reviewed  URINALYSIS, ROUTINE W REFLEX MICROSCOPIC - Abnormal; Notable for the following components:      Result Value   Color, Urine YELLOW (*)    APPearance CLEAR (*)    All other components within normal limits  COMPREHENSIVE METABOLIC PANEL WITH GFR - Abnormal; Notable for the following components:   Creatinine, Ser 1.05 (*)    ALT 63 (*)    GFR, Estimated 57 (*)  All other components within normal limits  CBC WITH DIFFERENTIAL/PLATELET - Abnormal; Notable for the following components:   MCV 77.8 (*)    MCH 24.4 (*)    All other components within normal limits  PROTIME-INR  LIPASE, BLOOD  LACTIC ACID, PLASMA  LACTIC ACID, PLASMA  TROPONIN I (HIGH SENSITIVITY)  TROPONIN I (HIGH SENSITIVITY)     EKG  Ecg = sinus bradycardia, rate 59, no gross ST elevation or depression, no significant repolarization abnormality, normal axis, normal intervals.  No evidence of ischemia nor arrhythmia on my interpretation.   RADIOLOGY CTA pending    PROCEDURES:  Critical Care performed: No  Procedures   MEDICATIONS ORDERED IN ED: Medications  diphenhydrAMINE  (BENADRYL ) capsule 50 mg (has no administration in time range)    Or  diphenhydrAMINE  (BENADRYL ) injection 50 mg (has no administration in time range)  morphine  (PF) 4 MG/ML injection 4  mg (has no administration in time range)  alum & mag hydroxide-simeth (MAALOX/MYLANTA) 200-200-20 MG/5ML suspension 30 mL (30 mLs Oral Given 07/05/24 1144)    And  lidocaine  (XYLOCAINE ) 2 % viscous mouth solution 15 mL (15 mLs Oral Given 07/05/24 1144)  ondansetron  (ZOFRAN ) injection 4 mg (4 mg Intravenous Given 07/05/24 1222)  famotidine  (PEPCID ) IVPB 20 mg premix (0 mg Intravenous Stopped 07/05/24 1250)  acetaminophen  (TYLENOL ) tablet 1,000 mg (1,000 mg Oral Given 07/05/24 1141)  methylPREDNISolone  sodium succinate (SOLU-MEDROL ) 40 mg/mL injection 40 mg (40 mg Intravenous Given 07/05/24 1255)     IMPRESSION / MDM / ASSESSMENT AND PLAN / ED COURSE  I reviewed the triage vital signs and the nursing notes.                              DDX/MDM/AP: Differential diagnosis includes, but is not limited to, GERD/dyspepsia, gastritis, doubt pancreatitis or choledocholithiasis.  Consider possibility of worsening of known celiac artery stenosis though has been stented in the past--Will trial GI cocktail and if no significant relief escalate to CTA, would require 4-hour pretreatment with steroids prior to contrast.  Doubt ACS/anginal equivalent.  Plan: - Labs  -EKG - GI cocktail, Tylenol , Zofran  - Consider escalation to CTA abdomen pelvis if no significant relief from GI cocktail  Patient's presentation is most consistent with acute presentation with potential threat to life or bodily function.  The patient is on the cardiac monitor to evaluate for evidence of arrhythmia and/or significant heart rate changes.  ED course below.  Workup overall reassuring though patient does have persistent significant pain that she says is well above her baseline despite GI cocktail here.  Discussed with our on-call vascular surgeon who also happens to be patient's primary vascular surgeon--ED does recommend that patient be kept for CTA imaging to rule out underlying vascular etiology of her severe pain.  Given that she  has a history of reported anaphylaxis to contrast, it is required by her hospital protocol to wait 13 hours after pretreatment with steroids.  Received steroids at 12:55 PM, is eligible to receive CTA at about 2 AM.  Adding lactate.  Hemodynamically stable here.  Admitted to hospitalist service for interval pain control, monitoring, CTA.  Vascular to follow.  Clinical Course as of 07/05/24 1338  Thu Jul 05, 2024  1111 Urinalysis reviewed, no evidence of infection, no hematuria [MM]  1220 CBC reviewed, no leukocytosis, no anemia [MM]  1249 Patient reevaluated, notes she does feel somewhat better after GI cocktail, pain improved to 7/10 from  10/10.  Does note that this pain is still well above her baseline and remains very tender in her upper abdomen.  In shared decision making, will escalate to CTA.  Per regional protocol, will give IV steroids 4 hours prior to CTA as well as Benadryl  about 1 hour prior to CTA.    [MM]  1323 D/w Dr Jama of vascular surgery - notes recent imaging reassuring earlier this year but that CTA would be beneficial, does recommend keeping her this, continue with 13-hour prep  Hospitalist consult order placed   [MM]    Clinical Course User Index [MM] Clarine Ozell LABOR, MD     FINAL CLINICAL IMPRESSION(S) / ED DIAGNOSES   Final diagnoses:  Upper abdominal pain  Celiac artery stenosis     Rx / DC Orders   ED Discharge Orders     None        Note:  This document was prepared using Dragon voice recognition software and may include unintentional dictation errors.   Clarine Ozell LABOR, MD 07/05/24 [id number]

## 2024-07-05 NOTE — Progress Notes (Signed)
 PHARMACIST - PHYSICIAN COMMUNICATION  CONCERNING:  Enoxaparin  (Lovenox ) for DVT Prophylaxis    RECOMMENDATION: Patient was prescribed enoxaprin 40mg  q24 hours for VTE prophylaxis.   Filed Weights   07/05/24 1033  Weight: 103 kg (227 lb 1.2 oz)    Body mass index is 33.53 kg/m.  Estimated Creatinine Clearance: 62.8 mL/min (A) (by C-G formula based on SCr of 1.05 mg/dL (H)).   Based on [facility] policy patient is candidate for enoxaparin  0.5mg /kg TBW SQ every 24 hours based on BMI being >30.   DESCRIPTION: Pharmacy has adjusted enoxaparin  dose per [facility] policy.  Patient is now receiving enoxaparin  0.5 mg/kg every 24 hours    Adriana JONETTA Bolster, PharmD Clinical Pharmacist  07/05/2024 3:33 PM

## 2024-07-05 NOTE — ED Notes (Signed)
Hospitalist is at bedside.

## 2024-07-06 ENCOUNTER — Encounter: Admission: EM | Disposition: A | Discharge status: Home / Self Care | Payer: Self-pay | Source: Home / Self Care

## 2024-07-06 ENCOUNTER — Observation Stay

## 2024-07-06 ENCOUNTER — Encounter: Payer: Self-pay | Admitting: Certified Registered"

## 2024-07-06 DIAGNOSIS — I774 Celiac artery compression syndrome: Secondary | ICD-10-CM | POA: Diagnosis not present

## 2024-07-06 DIAGNOSIS — R1010 Upper abdominal pain, unspecified: Secondary | ICD-10-CM | POA: Diagnosis not present

## 2024-07-06 DIAGNOSIS — Z79899 Other long term (current) drug therapy: Secondary | ICD-10-CM

## 2024-07-06 DIAGNOSIS — R109 Unspecified abdominal pain: Secondary | ICD-10-CM | POA: Diagnosis not present

## 2024-07-06 DIAGNOSIS — T82898A Other specified complication of vascular prosthetic devices, implants and grafts, initial encounter: Secondary | ICD-10-CM

## 2024-07-06 DIAGNOSIS — T82858A Stenosis of vascular prosthetic devices, implants and grafts, initial encounter: Secondary | ICD-10-CM

## 2024-07-06 DIAGNOSIS — K219 Gastro-esophageal reflux disease without esophagitis: Secondary | ICD-10-CM

## 2024-07-06 DIAGNOSIS — Z7982 Long term (current) use of aspirin: Secondary | ICD-10-CM

## 2024-07-06 DIAGNOSIS — E782 Mixed hyperlipidemia: Secondary | ICD-10-CM

## 2024-07-06 DIAGNOSIS — I708 Atherosclerosis of other arteries: Secondary | ICD-10-CM

## 2024-07-06 DIAGNOSIS — T82856A Stenosis of peripheral vascular stent, initial encounter: Secondary | ICD-10-CM | POA: Diagnosis not present

## 2024-07-06 DIAGNOSIS — Z9889 Other specified postprocedural states: Secondary | ICD-10-CM

## 2024-07-06 DIAGNOSIS — K551 Chronic vascular disorders of intestine: Secondary | ICD-10-CM

## 2024-07-06 DIAGNOSIS — I10 Essential (primary) hypertension: Secondary | ICD-10-CM | POA: Diagnosis not present

## 2024-07-06 DIAGNOSIS — Z95828 Presence of other vascular implants and grafts: Secondary | ICD-10-CM

## 2024-07-06 DIAGNOSIS — Z87891 Personal history of nicotine dependence: Secondary | ICD-10-CM

## 2024-07-06 HISTORY — PX: VISCERAL ANGIOGRAPHY: CATH118276

## 2024-07-06 LAB — CBC
HCT: 36.7 % (ref 36.0–46.0)
Hemoglobin: 11.7 g/dL — ABNORMAL LOW (ref 12.0–15.0)
MCH: 24.5 pg — ABNORMAL LOW (ref 26.0–34.0)
MCHC: 31.9 g/dL (ref 30.0–36.0)
MCV: 76.9 fL — ABNORMAL LOW (ref 80.0–100.0)
Platelets: 324 K/uL (ref 150–400)
RBC: 4.77 MIL/uL (ref 3.87–5.11)
RDW: 15.3 % (ref 11.5–15.5)
WBC: 8.8 K/uL (ref 4.0–10.5)
nRBC: 0 % (ref 0.0–0.2)

## 2024-07-06 LAB — COMPREHENSIVE METABOLIC PANEL WITH GFR
ALT: 108 U/L — ABNORMAL HIGH (ref 0–44)
AST: 63 U/L — ABNORMAL HIGH (ref 15–41)
Albumin: 3.9 g/dL (ref 3.5–5.0)
Alkaline Phosphatase: 67 U/L (ref 38–126)
Anion gap: 9 (ref 5–15)
BUN: 13 mg/dL (ref 8–23)
CO2: 24 mmol/L (ref 22–32)
Calcium: 9.3 mg/dL (ref 8.9–10.3)
Chloride: 104 mmol/L (ref 98–111)
Creatinine, Ser: 0.78 mg/dL (ref 0.44–1.00)
GFR, Estimated: >60 mL/min (ref >60)
Glucose, Bld: 127 mg/dL — ABNORMAL HIGH (ref 70–99)
Potassium: 3.8 mmol/L (ref 3.5–5.1)
Sodium: 137 mmol/L (ref 135–145)
Total Bilirubin: 0.3 mg/dL (ref 0.0–1.2)
Total Protein: 7.3 g/dL (ref 6.5–8.1)

## 2024-07-06 LAB — PROTIME-INR
INR: 1.1 (ref 0.8–1.2)
Prothrombin Time: 14.4 s (ref 11.4–15.2)

## 2024-07-06 SURGERY — VISCERAL ANGIOGRAPHY
Anesthesia: Moderate Sedation

## 2024-07-06 MED ORDER — METHYLPREDNISOLONE SODIUM SUCC 125 MG IJ SOLR
125.0000 mg | Freq: Once | INTRAMUSCULAR | Status: AC | PRN
  Administered 2024-07-06: 125 mg via INTRAVENOUS

## 2024-07-06 MED ORDER — HEPARIN SODIUM (PORCINE) 1000 UNIT/ML IJ SOLN
INTRAMUSCULAR | Status: AC
  Filled 2024-07-06: qty 10

## 2024-07-06 MED ORDER — LABETALOL HCL 5 MG/ML IV SOLN
INTRAVENOUS | Status: AC
  Filled 2024-07-06: qty 4

## 2024-07-06 MED ORDER — SODIUM CHLORIDE 0.9% FLUSH: 3.0000 mL | Freq: Two times a day (BID) | INTRAVENOUS | Status: DC

## 2024-07-06 MED ORDER — FAMOTIDINE 20 MG PO TABS
40.0000 mg | ORAL_TABLET | Freq: Once | ORAL | Status: AC | PRN
  Administered 2024-07-06: 40 mg via ORAL

## 2024-07-06 MED ORDER — CEFAZOLIN SODIUM-DEXTROSE 2-4 GM/100ML-% IV SOLN
2.0000 g | INTRAVENOUS | Status: AC
  Administered 2024-07-06: 2 g via INTRAVENOUS
  Filled 2024-07-06: qty 100

## 2024-07-06 MED ORDER — SODIUM CHLORIDE 0.9 % IV SOLN: INTRAVENOUS | Status: DC

## 2024-07-06 MED ORDER — LIDOCAINE-EPINEPHRINE (PF) 1 %-1:200000 IJ SOLN
INTRAMUSCULAR | Status: DC | PRN
  Administered 2024-07-06: 10 mL

## 2024-07-06 MED ORDER — SODIUM CHLORIDE 0.9 % IV SOLN: 250.0000 mL | INTRAVENOUS | Status: DC | PRN

## 2024-07-06 MED ORDER — FENTANYL CITRATE (PF) 100 MCG/2ML IJ SOLN
INTRAMUSCULAR | Status: AC
  Filled 2024-07-06: qty 2

## 2024-07-06 MED ORDER — FAMOTIDINE 20 MG PO TABS
ORAL_TABLET | ORAL | Status: AC
  Filled 2024-07-06: qty 2

## 2024-07-06 MED ORDER — DIPHENHYDRAMINE HCL 50 MG/ML IJ SOLN
INTRAMUSCULAR | Status: AC
  Filled 2024-07-06: qty 1

## 2024-07-06 MED ORDER — MIDAZOLAM HCL 2 MG/2ML IJ SOLN
INTRAMUSCULAR | Status: DC | PRN
  Administered 2024-07-06: 2 mg via INTRAVENOUS

## 2024-07-06 MED ORDER — IOHEXOL 350 MG/ML SOLN
100.0000 mL | Freq: Once | INTRAVENOUS | Status: AC | PRN
Start: 2024-07-06 — End: 2024-07-06
  Administered 2024-07-06: 100 mL via INTRAVENOUS

## 2024-07-06 MED ORDER — LABETALOL HCL 5 MG/ML IV SOLN
INTRAVENOUS | Status: DC | PRN
  Administered 2024-07-06: 10 mg via INTRAVENOUS

## 2024-07-06 MED ORDER — DIPHENHYDRAMINE HCL 25 MG PO CAPS: 50.0000 mg | ORAL_CAPSULE | Freq: Once | ORAL | Status: DC

## 2024-07-06 MED ORDER — HYDROCHLOROTHIAZIDE 12.5 MG PO TABS
12.5000 mg | ORAL_TABLET | Freq: Every day | ORAL | Status: DC
  Administered 2024-07-06 – 2024-07-07 (×2): 12.5 mg via ORAL
  Filled 2024-07-06 (×3): qty 1

## 2024-07-06 MED ORDER — HEPARIN SODIUM (PORCINE) 1000 UNIT/ML IJ SOLN
INTRAMUSCULAR | Status: DC | PRN
  Administered 2024-07-06: 6000 [IU] via INTRAVENOUS

## 2024-07-06 MED ORDER — CEFAZOLIN SODIUM-DEXTROSE 2-4 GM/100ML-% IV SOLN
INTRAVENOUS | Status: AC
Start: 2024-07-06 — End: 2024-07-06
  Filled 2024-07-06: qty 100

## 2024-07-06 MED ORDER — METHYLPREDNISOLONE SODIUM SUCC 40 MG IJ SOLR
40.0000 mg | Freq: Once | INTRAMUSCULAR | Status: AC
  Administered 2024-07-06: 40 mg via INTRAVENOUS
  Filled 2024-07-06: qty 1

## 2024-07-06 MED ORDER — LISINOPRIL-HYDROCHLOROTHIAZIDE 10-12.5 MG PO TABS: 1.0000 | ORAL_TABLET | Freq: Every day | ORAL | Status: DC

## 2024-07-06 MED ORDER — PANTOPRAZOLE SODIUM 40 MG PO TBEC
40.0000 mg | DELAYED_RELEASE_TABLET | Freq: Every day | ORAL | Status: DC
  Administered 2024-07-06 – 2024-07-07 (×2): 40 mg via ORAL
  Filled 2024-07-06 (×3): qty 1

## 2024-07-06 MED ORDER — MIDAZOLAM HCL 5 MG/5ML IJ SOLN
INTRAMUSCULAR | Status: AC
  Filled 2024-07-06: qty 5

## 2024-07-06 MED ORDER — LISINOPRIL 10 MG PO TABS
10.0000 mg | ORAL_TABLET | Freq: Every day | ORAL | Status: DC
Start: 2024-07-06 — End: 2024-07-07
  Administered 2024-07-06 – 2024-07-07 (×2): 10 mg via ORAL
  Filled 2024-07-06 (×2): qty 1

## 2024-07-06 MED ORDER — SODIUM CHLORIDE 0.9 % IV SOLN: INTRAVENOUS | Status: AC

## 2024-07-06 MED ORDER — FENTANYL CITRATE (PF) 100 MCG/2ML IJ SOLN
INTRAMUSCULAR | Status: DC | PRN
  Administered 2024-07-06 (×2): 25 ug via INTRAVENOUS

## 2024-07-06 MED ORDER — METHYLPREDNISOLONE SODIUM SUCC 125 MG IJ SOLR
INTRAMUSCULAR | Status: AC
  Filled 2024-07-06: qty 2

## 2024-07-06 MED ORDER — MIDAZOLAM HCL 2 MG/ML PO SYRP: 8.0000 mg | ORAL_SOLUTION | Freq: Once | ORAL | Status: DC | PRN

## 2024-07-06 MED ORDER — IODIXANOL 320 MG/ML IV SOLN
INTRAVENOUS | Status: DC | PRN
  Administered 2024-07-06: 85 mL via INTRA_ARTERIAL

## 2024-07-06 MED ORDER — HEPARIN (PORCINE) IN NACL 2000-0.9 UNIT/L-% IV SOLN
INTRAVENOUS | Status: DC | PRN
  Administered 2024-07-06: 1000 mL

## 2024-07-06 MED ORDER — DIPHENHYDRAMINE HCL 50 MG/ML IJ SOLN: 50.0000 mg | Freq: Once | INTRAMUSCULAR | Status: DC

## 2024-07-06 MED ORDER — DIPHENHYDRAMINE HCL 50 MG/ML IJ SOLN
50.0000 mg | Freq: Once | INTRAMUSCULAR | Status: AC | PRN
  Administered 2024-07-06: 50 mg via INTRAVENOUS

## 2024-07-06 MED ORDER — SODIUM CHLORIDE 0.9% FLUSH: 3.0000 mL | INTRAVENOUS | Status: DC | PRN

## 2024-07-06 SURGICAL SUPPLY — 23 items
BALLOON ARMADA 8X20X135 (BALLOONS) IMPLANT
CATH ANGIO 5F 80CM MHK1-H (CATHETERS) IMPLANT
CATH ANGIO 5F PIGTAIL 65CM (CATHETERS) IMPLANT
CATH BEACON 5 .035 65 C2 TIP (CATHETERS) IMPLANT
CATH VS15FR (CATHETERS) IMPLANT
COVER PROBE ULTRASOUND 5X96 (MISCELLANEOUS) IMPLANT
DEVICE PRESTO INFLATION (MISCELLANEOUS) IMPLANT
DEVICE STARCLOSE SE CLOSURE (Vascular Products) IMPLANT
DEVICE TORQUE (MISCELLANEOUS) IMPLANT
GLIDEWIRE STIFF .35X180X3 HYDR (WIRE) IMPLANT
KIT MICROPUNCTURE VSI 5F STIFF (SHEATH) IMPLANT
NDL ENTRY 21GA 7CM ECHOTIP (NEEDLE) IMPLANT
NEEDLE ENTRY 21GA 7CM ECHOTIP (NEEDLE) ×1 IMPLANT
PACK ANGIOGRAPHY (CUSTOM PROCEDURE TRAY) ×1 IMPLANT
SET INTRO CAPELLA COAXIAL (SET/KITS/TRAYS/PACK) IMPLANT
SHEATH ANL2 6FRX45 HC (SHEATH) IMPLANT
SHEATH BRITE TIP 5FRX11 (SHEATH) IMPLANT
SHEATH FLEXOR ANSEL2 7FRX45 (SHEATH) IMPLANT
STENT LIFESTREAM 7X26X80 (Permanent Stent) IMPLANT
SYR MEDRAD MARK 7 150ML (SYRINGE) IMPLANT
TUBING CONTRAST HIGH PRESS 72 (TUBING) IMPLANT
WIRE J 3MM .035X145CM (WIRE) IMPLANT
WIRE SUPRACORE 190CM (WIRE) IMPLANT

## 2024-07-06 NOTE — Assessment & Plan Note (Signed)
 Continue home Lipitor

## 2024-07-06 NOTE — TOC CM/SW Note (Signed)
 Transition of Care [facility]) - Inpatient Brief Assessment   Patient Details  Name: Teresa Sparks MRN: [id number] Date of Birth: [birth year]  Transition of [facility]) CM/SW Contact:    Corean ONEIDA Haddock, RN Phone Number: 07/06/2024, 3:39 PM   Clinical Narrative:   Transition of Care [facility]) Screening Note   Patient Details  Name: Teresa Sparks Date of Birth: 11-29-1950   [facility] CM/SW Contact:    Corean ONEIDA Haddock, RN Phone Number: 07/06/2024, 3:39 PM    Transition of Care Department [facility]) has reviewed patient and no TOC needs have been identified at this time.  If new patient transition needs arise, please place a TOC consult.     Transition of Care Asessment: Insurance and Status: Insurance coverage has been reviewed Patient has primary care physician: Yes     Prior/Current Home Services: No current home services Social Drivers of Health Review: SDOH reviewed no interventions necessary Readmission risk has been reviewed: Yes Transition of care needs: no transition of care needs at this time

## 2024-07-06 NOTE — Care Management Obs Status (Signed)
 MEDICARE OBSERVATION STATUS NOTIFICATION   Patient Details  Name: Teresa Sparks MRN: [id number] Date of Birth: March 16, 1951   Medicare Observation Status Notification Given:  Yes    [name] 07/06/2024, 5:40 PM

## 2024-07-06 NOTE — Consult Note (Signed)
 Hospital Consult    Reason for Consult:  Abdominal Pain  Requesting Physician:  Dr Ozell Klein MD  MRN #:  [id number]  History of Present Illness: [name] celiac artery stenosis with stent placement, chronic abdominal pain, htn, claudication (followed by vascular surgery) who presents with 3 weeks of intermittent generalized abdominal pain and pain that radiates up through her chest and into her neck.  Patient reports compliance of all of her medications.  States that her last bowel movement was yesterday which was normal.  Denies any shortness of breath.  Reports regular follow-up with her primary care physician but states that she cannot follow-up with her gastroenterologist till April of next year.  Denies any nausea or vomiting.  She does tell me that she did have a fall from bed 3 nights ago after a bad dream on her left side and has had subsequent left shoulder pain.  Upon work up patient underwent CTA of the abdomen and pelvis revealing a 50% stenosis of her SMA at the celiac axis origin of the SMA. Vascular Surgery was consulted to evaluate.    Past Medical History:  Diagnosis Date   Adenomatous colon polyp    Adrenal adenoma    Allergic genetic state    Arthritis    Chronic abdominal pain    GERD (gastroesophageal reflux disease)    Hiatal hernia    Hypertension    Sleep apnea     Past Surgical History:  Procedure Laterality Date   ABDOMINAL HYSTERECTOMY     CHOLECYSTECTOMY     COLONOSCOPY     COLONOSCOPY WITH PROPOFOL  N/A 07/24/2018   Procedure: COLONOSCOPY WITH PROPOFOL ;  Surgeon: Viktoria Lamar DASEN, MD;  Location: [facility] ENDOSCOPY;  Service: Endoscopy;  Laterality: N/A;   COLONOSCOPY WITH PROPOFOL  N/A 11/23/2023   Procedure: COLONOSCOPY WITH PROPOFOL ;  Surgeon: Toledo, Ladell POUR, MD;  Location: [facility] ENDOSCOPY;  Service: Gastroenterology;  Laterality: N/A;   ENDOBRONCHIAL ULTRASOUND Bilateral 09/30/2023   Procedure: ENDOBRONCHIAL ULTRASOUND;  Surgeon:  Tamea Dedra CROME, MD;  Location: [facility] ORS;  Service: Pulmonary;  Laterality: Bilateral;   ESOPHAGOGASTRODUODENOSCOPY (EGD) WITH PROPOFOL  N/A 11/23/2023   Procedure: ESOPHAGOGASTRODUODENOSCOPY (EGD) WITH PROPOFOL ;  Surgeon: Toledo, Ladell POUR, MD;  Location: [facility] ENDOSCOPY;  Service: Gastroenterology;  Laterality: N/A;   FRACTURE SURGERY     HEMOSTASIS CLIP PLACEMENT  11/23/2023   Procedure: HEMOSTASIS CLIP PLACEMENT;  Surgeon: Aundria, Teodoro K, MD;  Location: [facility] ENDOSCOPY;  Service: Gastroenterology;;   HOT HEMOSTASIS  11/23/2023   Procedure: HOT HEMOSTASIS (ARGON PLASMA COAGULATION/BICAP);  Surgeon: Aundria, Ladell POUR, MD;  Location: [facility] ENDOSCOPY;  Service: Gastroenterology;;   JOINT REPLACEMENT Right    6 years ago   POLYPECTOMY  11/23/2023   Procedure: POLYPECTOMY;  Surgeon: Aundria, Ladell POUR, MD;  Location: [facility] ENDOSCOPY;  Service: Gastroenterology;;   VISCERAL ANGIOGRAPHY N/A 02/05/2022   Procedure: VISCERAL ANGIOGRAPHY;  Surgeon: Jama Cordella MATSU, MD;  Location: [facility] INVASIVE CV LAB;  Service: Cardiovascular;  Laterality: N/A;   VISCERAL ANGIOGRAPHY N/A 10/04/2023   Procedure: VISCERAL ANGIOGRAPHY;  Surgeon: Jama Cordella MATSU, MD;  Location: [facility] INVASIVE CV LAB;  Service: Cardiovascular;  Laterality: N/A;   VISCERAL ANGIOGRAPHY N/A 11/25/2023   Procedure: VISCERAL ANGIOGRAPHY;  Surgeon: Jama Cordella MATSU, MD;  Location: [facility] INVASIVE CV LAB;  Service: Cardiovascular;  Laterality: N/A;    Allergies  Allergen Reactions   Iodinated Contrast Media Anaphylaxis    Other reaction(s): Vomiting   Ciprofloxacin Diarrhea and Nausea Only  Nausea, vomiting  Other reaction(s): Vomiting   Codeine Sulfate [Codeine] Diarrhea    Nausea, vomiting   Meloxicam    Penicillins Nausea Only   Protonix  [Pantoprazole  Sodium] Hives   Shellfish Allergy Swelling   Sulfa Antibiotics Rash    Prior to Admission medications   Medication Sig Start Date End Date Taking? Authorizing Provider   acetaminophen  (TYLENOL ) 650 MG CR tablet Take 650 mg by mouth every 8 (eight) hours as needed for pain.   Yes [provider]  aspirin  81 MG chewable tablet Chew by mouth daily.   Yes [provider]  atorvastatin  (LIPITOR) 10 MG tablet Take 10 mg by mouth daily with supper.   Yes [provider]  cetirizine (ZYRTEC) 10 MG tablet Take 10 mg by mouth daily.   Yes [provider]  colchicine  0.6 MG tablet Take 0.6 mg by mouth as needed. 10/14/21  Yes [provider]  esomeprazole  (NEXIUM ) 40 MG capsule Take 40 mg by mouth 2 (two) times daily.   Yes [provider]  famotidine  (PEPCID ) 40 MG tablet Take 40 mg by mouth at bedtime. 06/21/24  Yes [provider]  ferrous sulfate  325 (65 FE) MG tablet Take 325 mg by mouth daily with breakfast.   Yes [provider]  fluticasone  (FLONASE ) 50 MCG/ACT nasal spray Place 1 spray into both nostrils daily.   Yes [provider]  hydrALAZINE  (APRESOLINE ) 50 MG tablet Take 50 mg by mouth 2 (two) times daily.   Yes [provider]  lisinopril -hydrochlorothiazide  (ZESTORETIC ) 10-12.5 MG tablet Take 1 tablet by mouth daily. 10/13/21  Yes [provider]  metoprolol  succinate (TOPROL -XL) 25 MG 24 hr tablet Take 25 mg by mouth daily. 10/04/22  Yes [provider]  Multiple Vitamins-Minerals (CENTRUM ADULTS PO) Take by mouth daily.   Yes [provider]  omeprazole  (PRILOSEC) 40 MG capsule Take 40 mg by mouth 2 (two) times daily.   Yes [provider]  oxyCODONE -acetaminophen  (PERCOCET) 5-325 MG tablet Take 1 tablet by mouth every 6 (six) hours as needed for up to 3 days for severe pain (pain score 7-10). 07/03/24 07/06/24 Yes Nicholaus Rolland BRAVO, MD  PARoxetine  (PAXIL ) 10 MG tablet Take 1 tablet by mouth daily at 6 (six) AM. 11/06/21  Yes [provider]  sucralfate (CARAFATE) 1 g tablet Take 1 g by mouth 4 (four) times daily. 06/21/24  Yes  [provider]  DULoxetine  (CYMBALTA ) 30 MG capsule Take 30 mg by mouth daily.    [provider]    Social History   Socioeconomic History   Marital status: Widowed    Spouse name: Not on file   Number of children: 3   Years of education: Not on file   Highest education level: Not on file  Occupational History   Not on file  Tobacco Use   Smoking status: Former    Average packs/day: 0.3 packs/day for 2.0 years (0.5 ttl pk-yrs)    Types: Cigarettes    Start date: 36   Smokeless tobacco: Current    Types: Snuff  Vaping Use   Vaping status: Never Used  Substance and Sexual Activity   Alcohol use: No   Drug use: Never   Sexual activity: Not Currently  Other Topics Concern   Not on file  Social History Narrative   Lives by herself.: daughter is close by : [place]    Social Drivers of Health   Financial Resource Strain: High Risk (06/21/2024)  Received from [facility]   Overall Financial Resource Strain (CARDIA)    Difficulty of Paying Living Expenses: Hard  Food Insecurity: No Food Insecurity (07/05/2024)   Hunger Vital Sign    Worried About Running Out of Food in the Last Year: Never true    Ran Out of Food in the Last Year: Never true  Recent Concern: Food Insecurity - Food Insecurity Present (06/21/2024)   Received from [facility]   Hunger Vital Sign    Within the past 12 months, you worried that your food would run out before you got the money to buy more.: Sometimes true    Within the past 12 months, the food you bought just didn't last and you didn't have money to get more.: Sometimes true  Transportation Needs: No Transportation Needs (07/05/2024)   PRAPARE - Administrator, Civil Service (Medical): No    Lack of Transportation (Non-Medical): No  Physical Activity: Not on file  Stress: Not on file  Social Connections: Moderately Integrated (07/05/2024)   Social Connection and Isolation Panel     Frequency of Communication with Friends and Family: Never    Frequency of Social Gatherings with Friends and Family: Three times a week    Attends Religious Services: More than 4 times per year    Active Member of Clubs or Organizations: Yes    Attends Banker Meetings: More than 4 times per year    Marital Status: Widowed  Intimate Partner Violence: Not At Risk (07/05/2024)   Humiliation, Afraid, Rape, and Kick questionnaire    Fear of Current or Ex-Partner: No    Emotionally Abused: No    Physically Abused: No    Sexually Abused: No     Family History  Problem Relation Age of Onset   Hypertension Mother    Stroke Mother    Epilepsy Mother    Colon polyps Sister    Breast cancer Other     ROS: Otherwise negative unless mentioned in HPI  Physical Examination  Vitals:   07/05/24 1936 07/06/24 0411  BP: (!) 150/66 (!) 152/74  Pulse: 89 87  Resp: 19 18  Temp: 98.6 F (37 C) 98.1 F (36.7 C)  SpO2: 99% 98%   Body mass index is 33.53 kg/m.  General:  WDWN in NAD Gait: Not observed HENT: WNL, normocephalic Pulmonary: normal non-labored breathing, without Rales, rhonchi,  wheezing Cardiac: regular, without  Murmurs, rubs or gallops; without carotid bruits Abdomen: Positive bowel sounds throughout, soft, Positive tenderness at the umbilicus just below sternal notch/ND, no masses Skin: without rashes Vascular Exam/Pulses: Palpable pulses throughout, all extremities are warm to touch.  Extremities: without ischemic changes, without Gangrene , without cellulitis; without open wounds;  Musculoskeletal: no muscle wasting or atrophy  Neurologic: A&O X 3;  No focal weakness or paresthesias are detected; speech is fluent/normal Psychiatric:  The pt has Normal affect. Lymph:  Unremarkable  CBC    Component Value Date/Time   WBC 8.8 07/06/2024 0525   RBC 4.77 07/06/2024 0525   HGB 11.7 (L) 07/06/2024 0525   HGB 10.8 (L) 05/08/2014 0151   HCT 36.7 07/06/2024  0525   HCT 35.5 05/08/2014 0151   PLT 324 07/06/2024 0525   PLT 308 05/08/2014 0151   MCV 76.9 (L) 07/06/2024 0525   MCV 76 (L) 05/08/2014 0151   MCH 24.5 (L) 07/06/2024 0525   MCHC 31.9 07/06/2024 0525   RDW 15.3 07/06/2024 0525   RDW  16.3 (H) 05/08/2014 0151   LYMPHSABS 2.0 07/05/2024 1201   LYMPHSABS 3.4 05/08/2014 0151   MONOABS 0.5 07/05/2024 1201   MONOABS 0.5 05/08/2014 0151   EOSABS 0.1 07/05/2024 1201   EOSABS 0.1 05/08/2014 0151   BASOSABS 0.0 07/05/2024 1201   BASOSABS 0.1 05/08/2014 0151    BMET    Component Value Date/Time   NA 137 07/06/2024 0525   NA 138 05/08/2014 0151   K 3.8 07/06/2024 0525   K 3.8 05/08/2014 0151   CL 104 07/06/2024 0525   CL 106 05/08/2014 0151   CO2 24 07/06/2024 0525   CO2 23 05/08/2014 0151   GLUCOSE 127 (H) 07/06/2024 0525   GLUCOSE 97 05/08/2014 0151   BUN 13 07/06/2024 0525   BUN 16 05/08/2014 0151   CREATININE 0.78 07/06/2024 0525   CREATININE 1.25 05/08/2014 0151   CALCIUM  9.3 07/06/2024 0525   CALCIUM  8.8 05/08/2014 0151   GFRNONAA >60 07/06/2024 0525   GFRNONAA 46 (L) 05/08/2014 0151   GFRAA 56 (L) 04/23/2018 0901   GFRAA 54 (L) 05/08/2014 0151    COAGS: Lab Results  Component Value Date   INR 1.1 07/06/2024   INR 1.0 07/05/2024   INR 1.0 10/03/2023     Non-Invasive Vascular Imaging:   EXAM: CTA ABDOMEN AND PELVIS WITH AND WITHOUT IV CONTRAST 07/06/2024 03:32:12 AM   TECHNIQUE: CTA images of the abdomen and pelvis with and without intravenous contrast. Three-dimensional MIP/volume rendered formations were performed. Automated exposure control, iterative reconstruction, and/or weight based adjustment of the mA/kV was utilized to reduce the radiation dose to as low as reasonably achievable. 100mL of iohexol  (OMNIPAQUE ) 350 MG/ML injection was used for contrast.   COMPARISON: None available.   CLINICAL HISTORY: Severe upper abdominal pain, history of iliac artery stenosis with stent. Pt states she is  having upper abd pain and lower back pain. Pt unable to get in to see GI doc today. Endorses nausea with no vomiting.   FINDINGS:   VASCULATURE:   AORTA: Extensive aortoiliac atherosclerotic calcification. No aneurysm or dissection.   CELIAC TRUNK: Stenting of the celiac axis origin has been performed with a residual 50% stenosis of the celiac axis. This vessel remains patent, however.   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 Distally the vessel is widely patent. No aneurysm.   RENAL ARTERIES: Renal arteries are widely patent, demonstrate normal vascular morphology, and are free of aneurysmal dissection. 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 No follow up imaging recommended.   ILIAC ARTERIES: Lower extremity arterial inflow is widely patent.   LIVER: The liver is unremarkable.   GALLBLADDER AND BILE DUCTS: Status post cholecystectomy. Stable extrahepatic biliary ductal dilation in keeping with post cholecystectomy change.   SPLEEN: The spleen is unremarkable.   PANCREAS: The pancreas is unremarkable.   ADRENAL GLANDS: Stable 22 mm left adrenal adenoma.   KIDNEYS, URETERS AND BLADDER: No stones in the kidneys or ureters. No hydronephrosis. No perinephric or periureteral stranding. Urinary bladder is unremarkable.   GI AND BOWEL: Stomach and duodenal sweep demonstrate no acute abnormality. There is no bowel obstruction. No abnormal bowel wall thickening or distension.   REPRODUCTIVE: Status post hysterectomy. No adnexal masses are seen.   PERITONEUM AND RETRPERITONEUM: No ascites or free air.   LUNG BASE: Visualized lung bases are clear.   LYMPH NODES: No lymphadenopathy.   BONES  AND SOFT TISSUES: Osseous structures are age appropriate. No acute bone abnormality. No lytic or blastic bone lesion.   HEART AND  MEDIASTINUM: Moderate right coronary artery calcification. cardiac size within normal limits.   IMPRESSION: 1. Stenting of the celiac axis origin with residual 50% stenosis, vessel remains patent. 2. 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 Clinical correlation for signs and symptoms of chronic mesenteric ischemia may be helpful.  Statin:  Yes.   Beta Blocker:  Yes.   Aspirin :  Yes.   ACEI:  Yes.   ARB:  No. CCB use:  No Other antiplatelets/anticoagulants:  No.    ASSESSMENT/PLAN: [name] who presents to [facility] emergency department with 3 weeks of intermittent abdominal pain that radiates to her chest and back. Patient has a history of prior SMA stenosis with stent placement. Upon work up patient noted to have a 50% stenosis of the SMA at the celiac axis. Vascular Surgery recommends a visceral angiogram to assess.   Vascular surgery plans on taking the patient to the vascular lab this morning for a visceral angiogram with possible intervention. I discussed in detail with patient at the bedside the procedure, benefits, risks and complications. Patient verbalized her understanding and wishes to proceed. I answered all the patients questions. Patient has been NPO since midnight last night. Patients last Bun/Creatinine was 13/0.78 and last H&H was 11.7/36.7   -I discussed the case in detail with Dr Cordella Shawl DM and he agrees with the plan.    Gwendlyn JONELLE Shank [facility] 07/06/2024 7:35 AM

## 2024-07-06 NOTE — Assessment & Plan Note (Signed)
 Blood pressure elevated. -Resume home Zestoretic , metoprolol  and hydralazine .

## 2024-07-06 NOTE — Progress Notes (Signed)
  Progress Note   Patient: Teresa Sparks FMW:[id number] DOB: July 10, 1951 DOA: 07/05/2024     0 DOS: the patient was seen and examined on 07/06/2024   Brief hospital course: Partly taken from H&P.   Teresa Sparks is a pleasant 73 y.o. female with medical history significant for celiac artery stenosis s/p stent, chronic abdominal pain, HTN, GERD, sleep apnea, claudication who presented to ED complaining of progressively worsening abdominal pain for 3 weeks.  Endorsed some nausea but no vomiting.  On presentation patient was in severe 10 out of 10 pain.  Vital and labs stable, UA not consistent with UTI Vascular was consulted and they advised CTA abdomen and pelvis, she was started on a 13-hour steroid protocol for for her contrast allergy and admitted for further workup.  10/10: Elevated blood pressure at 181/77, mild transaminitis, CTA abdomen and pelvis with stenosis of the prior celiac stent, also found to have a focal dissection flap at the origin of SMA-findings consistent with mesenteric ischemia, s/p a new stent placed in celiac by vascular surgery today.  No dissection or significant stenosis found which could explain her symptoms at this time.  Assessment and Plan: * Abdominal pain Patient with history of mesenteric ischemia and stent placed in celiac artery.  Abnormal CTA so vascular surgery was consulted and patient was taken to vascular lab, angiography was not consistent with dissection, and new stent was placed in celiac artery but there was no significant stenosis noted which can explain her symptoms. - Vascular would like to observe for another night -Continue with supportive care  Essential hypertension Blood pressure elevated. -Resume home Zestoretic , metoprolol  and hydralazine .  GERD (gastroesophageal reflux disease) - Continue PPI  Hyperlipidemia - Continue home Lipitor   Subjective: Patient continued to have intermittent abdominal pain, no nausea, vomiting  or diarrhea.  She was awaiting procedure with vascular.  Physical Exam: Vitals:   07/06/24 1514 07/06/24 1519 07/06/24 1530 07/06/24 1545  BP: (!) 185/65 (!) 164/94 (!) 174/72 (!) 170/77  Pulse: 79 78 81 84  Resp: 17 18 14 17   Temp:   (!) 96.5 F (35.8 C)   TempSrc:   Temporal   SpO2: 100% 100% 97% 96%  Weight:      Height:       General.  Obese lady, in no acute distress. Pulmonary.  Lungs clear bilaterally, normal respiratory effort. CV.  Regular rate and rhythm, no JVD, rub or murmur. Abdomen.  Soft, nontender, nondistended, BS positive. CNS.  Alert and oriented .  No focal neurologic deficit. Extremities.  No edema, no cyanosis, pulses intact and symmetrical. Psychiatry.  Judgment and insight appears normal.   Data Reviewed: Prior data reviewed  Family Communication: Discussed with daughter at bedside  Disposition: Status is: Observation The patient will require care spanning > 2 midnights and should be moved to inpatient because: Vascular would like to keep for another night  Planned Discharge Destination: Home  DVT prophylaxis.  Lovenox  Time spent: 50 minutes  This record has been created using Conservation officer, historic buildings. Errors have been sought and corrected,but may not always be located. Such creation errors do not reflect on the standard of care.   Author: Amaryllis Dare, MD 07/06/2024 3:51 PM  For on call review [email or web address].

## 2024-07-06 NOTE — Op Note (Signed)
 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  Abdominal aortogram in the lateral views  2.  Selective injection of the celiac order catheter placement  3.  Percutaneous transluminal angioplasty and stent placement of the celiac artery             4.  Selective injection of the superior mesenteric artery first-order catheter placement             5.  StarClose right common femoral artery    Anesthesia: Conscious sedation was administered under my direct supervision by the interventional radiology RN. IV Versed  plus fentanyl  were utilized. Continuous ECG, pulse oximetry and blood pressure was monitored throughout the entire procedure.  Conscious sedation was for a total of 70 minutes.  Sheath: 7 [place] Ansell retrograde right common femoral artery  Contrast: 85 cc  Fluoroscopy Time: 15.1 minutes  Indications:  Teresa Sparks presented with abdominal pain and bloody diarrhea. CT scan demonstrated diffuse colitis. The patient is ruled out for infectious etiologies and therefore the surgicalist service is concerned that this represents ischemic colitis and has asked us  to evaluate the patient for mesenteric ischemia. The rest and benefits of angiography with possible intervention have been reviewed, all questions were answered and the patient has agreed to proceed with angiography and possible intervention.  Procedure:  Teresa Sparks is a 73 y.o. female who was identified and appropriate procedural time out was performed.  The patient was then placed supine on the table and prepped and draped in  the usual sterile fashion.    Ultrasound was used to evaluate the right common femoral artery.  It was pulsatile and echolucent indicating it was patent .  A micropuncture needle was used to access the right common femoral artery under direct ultrasound guidance and an image was recorded for the permanent record.  A 0.035 J wire was advanced without resistance and a 5Fr sheath was placed.  J-wire was advanced followed by pigtail catheter  Pigtail catheter was positioned to the level of T10 and AP and lateral views of the aorta was obtained. This localized the SMA and celiac artery origins. Working in the lateral projection pigtail catheter was exchanged for a V S1 catheter.  Using a stiff angled Glidewire and of the S1 catheter I was able to select the celiac however it appeared this was behind the stent.  I disengaged and then reposition the PS1 and selected the SMA.  Stiff angled Glidewire was then advanced distally followed by the PS1 catheter.  6 [place] Ansell sheath was then advanced up and into the SMA proximally.  He the stiff angled Glidewire was then exchanged for a supra core wire and the V S1 catheter was removed.  With the sheath in the proximal SMA hand-injection of contrast was performed demonstrate the distal SMA.  The sheath was repositioned to the origin of the SMA and imaging of the SMA was then performed lateral as well as bilateral oblique views of the proximal SFA were then performed.  There is no evidence of dissection.  There is no evidence of atherosclerotic changes.  There is no evidence of stenosis.  This is apparently a false reading from the CT scan.  6000  units of heparin  was given and allowed to circulate for several minutes.   What is very clear is the existing stent in the celiac artery is significantly deformed and does appear to be narrowed much greater than 50%.  This is cooperated with injection through the sheath at the origin of the celiac artery.  Based on this I did  feel that treatment of the celiac was indicated.  I then attempted to use a C2 catheter but ultimately the merit V S1 catheter was able to engage the celiac.  Again this appeared to be coursing behind the stent and not through the origin of the stent.  Nevertheless based on the CT imaging and the extensive deformity within the stent I felt that if I could get a 7 French sheath beyond this stent given that it is balloon expandable I could then restent the artery and essentially crush the old stent out to the side.  With the stiff angled Glidewire now well out into the splenic artery for purchase I was able to track the PS1 catheter and then exchange the Glidewire for a supra core wire had been negotiated out into the splenic artery.  The 7 [place] Ansell sheath was then advanced beyond the stent and injection through the sheath confirmed intraluminal positioning and the location of the bifurcation of the splenic and common hepatic.  A 7 mm x 26 mm Lifestream stent was advanced over the wire positioned across the lesion follow-up hand injection contrast was used to verify stent placement and then the stent was deployed to 12 atm for 30 seconds. Follow-up imaging demonstrated the stent in excellent position.  I then wanted to flare the aortic portion and attempted to advance an 8 mm x 20 mm balloon however the wire prolapsed and I was unable to perform this secondary inflation.  Follow-up imaging demonstrated a stent that was well approximated and of appropriate diameter to the celiac artery. Distal runoff is preserved.  After review these images the catheters removed over wire oblique view of the right groin is obtained and a StarClose device deployed without difficulty there are no immediate complications.  Findings:   Aortogram: Normal-appearing aorta.  Previously noted stent in the celiac axis origin demonstrates severe deformity.  Distal to the stent there is normal flow within the distal celiac as well as the  splenic and hepatic arteries.  SMA appears normal.  Selective injection of the SMA as described above demonstrates a normal artery there is definitely no evidence of dissection.  Angioplasty and stent placement of the celiac artery does reopen the celiac artery which now should provide near normal flow.    Disposition: Patient was taken to the recovery room in stable condition having tolerated the procedure well.  Cordella Freddi Schrager 07/06/2024,3:24 PM

## 2024-07-06 NOTE — Plan of Care (Signed)
  Problem: Clinical Measurements: Goal: Diagnostic test results will improve Outcome: Progressing   Problem: Coping: Goal: Level of anxiety will decrease Outcome: Progressing   Problem: Pain Managment: Goal: General experience of comfort will improve and/or be controlled Outcome: Progressing

## 2024-07-06 NOTE — Assessment & Plan Note (Signed)
 Continue PPI.

## 2024-07-06 NOTE — Hospital Course (Addendum)
 Partly taken from H&P.   Teresa Sparks is a pleasant 73 y.o. female with medical history significant for celiac artery stenosis s/p stent, chronic abdominal pain, HTN, GERD, sleep apnea, claudication who presented to ED complaining of progressively worsening abdominal pain for 3 weeks.  Endorsed some nausea but no vomiting.  On presentation patient was in severe 10 out of 10 pain.  Vital and labs stable, UA not consistent with UTI Vascular was consulted and they advised CTA abdomen and pelvis, she was started on a 13-hour steroid protocol for for her contrast allergy and admitted for further workup.  10/10: Elevated blood pressure at 181/77, mild transaminitis, CTA abdomen and pelvis with stenosis of the prior celiac stent, also found to have a focal dissection flap at the origin of SMA-findings consistent with mesenteric ischemia, s/p a new stent placed in celiac by vascular surgery today.  No dissection or significant stenosis found which could explain her symptoms at this time.

## 2024-07-06 NOTE — Assessment & Plan Note (Signed)
 Patient with history of mesenteric ischemia and stent placed in celiac artery.  Abnormal CTA so vascular surgery was consulted and patient was taken to vascular lab, angiography was not consistent with dissection, and new stent was placed in celiac artery but there was no significant stenosis noted which can explain her symptoms. - Vascular would like to observe for another night -Continue with supportive care

## 2024-07-07 DIAGNOSIS — R1010 Upper abdominal pain, unspecified: Secondary | ICD-10-CM | POA: Diagnosis not present

## 2024-07-07 DIAGNOSIS — T82856A Stenosis of peripheral vascular stent, initial encounter: Secondary | ICD-10-CM | POA: Diagnosis not present

## 2024-07-07 MED ORDER — SUCRALFATE 1 G PO TABS: 1.0000 g | ORAL_TABLET | Freq: Four times a day (QID) | ORAL | Status: AC | PRN

## 2024-07-07 NOTE — Discharge Summary (Signed)
 Physician Discharge Summary   Patient: Teresa Sparks MRN: [id number] DOB: 28-Nov-1950  Admit date:     07/05/2024  Discharge date: 07/07/24  Discharge Physician: AIDA CHO   PCP: Stanton Lynwood FALCON, MD   Recommendations at discharge:   Follow-up with PCP in 1 to 2 weeks  Discharge Diagnoses: Principal Problem:   Pain of upper abdomen Active Problems:   Celiac artery stenosis   Essential hypertension   GERD (gastroesophageal reflux disease)   Hyperlipidemia  Resolved Problems:   * No resolved hospital problems. *  Hospital Course:   Teresa Sparks is a pleasant 73 y.o. female with medical history significant for celiac artery stenosis s/p stent, chronic abdominal pain, HTN, GERD, sleep apnea, claudication, who presented to the hospital with worsening abdominal pain of about 3 weeks duration.  Vascular surgeon was consulted and CT abdomen and pelvis was recommended.  Patient was started on a 13-hour steroid protocol because of history of IV contrast allergy.   Assessment and Plan:   Abdominal pain, celiac artery stenosis, history of mesenteric ischemia: S/p abdominal aortogram and percutaneous transluminal angioplasty and stent placement of the celiac artery on 07/06/2024. Continue low-dose aspirin  and statin.  Case discussed with Dr. Laurence, vascular surgeon on-call.  Patient is okay for discharge from his  standpoint.  He said diuresis no indication for dual antiplatelet therapy.   Comorbidities include hypertension, GERD, hyperlipidemia   Abdominal pain is resolved.  Her condition is improved and she feels well enough to be discharged home today.      Consultants: Vascular surgeon Procedures performed: Percutaneous transluminal angioplasty and stent placement left celiac artery Disposition: Home Diet recommendation:  Discharge Diet Orders (From admission, onward)     Start     Ordered   07/07/24 0000  Diet - low sodium heart healthy        07/07/24  1008           Cardiac diet DISCHARGE MEDICATION: Allergies as of 07/07/2024       Reactions   Iodinated Contrast Media Anaphylaxis   Other reaction(s): Vomiting   Ciprofloxacin Diarrhea, Nausea Only   Nausea, vomiting  Other reaction(s): Vomiting   Codeine Sulfate [codeine] Diarrhea   Nausea, vomiting   Meloxicam    Penicillins Nausea Only   Protonix  [pantoprazole  Sodium] Hives   Shellfish Allergy Swelling   Sulfa Antibiotics Rash        Medication List     STOP taking these medications    DULoxetine  30 MG capsule Commonly known as: CYMBALTA    omeprazole  40 MG capsule Commonly known as: PRILOSEC   oxyCODONE -acetaminophen  5-325 MG tablet Commonly known as: Percocet       TAKE these medications    acetaminophen  650 MG CR tablet Commonly known as: TYLENOL  Take 650 mg by mouth every 8 (eight) hours as needed for pain.   aspirin  81 MG chewable tablet Chew by mouth daily.   atorvastatin  10 MG tablet Commonly known as: LIPITOR Take 10 mg by mouth daily with supper.   CENTRUM ADULTS PO Take by mouth daily.   cetirizine 10 MG tablet Commonly known as: ZYRTEC Take 10 mg by mouth daily.   colchicine  0.6 MG tablet Take 0.6 mg by mouth as needed.   esomeprazole  40 MG capsule Commonly known as: NEXIUM  Take 40 mg by mouth 2 (two) times daily.   famotidine  40 MG tablet Commonly known as: PEPCID  Take 40 mg by mouth at bedtime.   ferrous sulfate  325 (65 FE)  MG tablet Take 325 mg by mouth daily with breakfast.   fluticasone  50 MCG/ACT nasal spray Commonly known as: FLONASE  Place 1 spray into both nostrils daily.   hydrALAZINE  50 MG tablet Commonly known as: APRESOLINE  Take 50 mg by mouth 2 (two) times daily.   lisinopril -hydrochlorothiazide  10-12.5 MG tablet Commonly known as: ZESTORETIC  Take 1 tablet by mouth daily.   metoprolol  succinate 25 MG 24 hr tablet Commonly known as: TOPROL -XL Take 25 mg by mouth daily.   PARoxetine  10 MG  tablet Commonly known as: PAXIL  Take 1 tablet by mouth daily at 6 (six) AM.   sucralfate 1 g tablet Commonly known as: CARAFATE Take 1 tablet (1 g total) by mouth 4 (four) times daily as needed. What changed:  when to take this reasons to take this        Discharge Exam: Filed Weights   07/05/24 1033  Weight: 103 kg   GEN: NAD SKIN: Warm and dry EYES: No pallor or icterus ENT: MMM CV: RRR PULM: CTA B ABD: soft, ND, NT, +BS CNS: AAO x 3, non focal EXT: No edema or tenderness   Condition at discharge: good  The results of significant diagnostics from this hospitalization (including imaging, microbiology, ancillary and laboratory) are listed below for reference.   Imaging Studies: PERIPHERAL VASCULAR CATHETERIZATION Result Date: 07/06/2024 See surgical note for result.  CT Angio Abd/Pel W and/or Wo Contrast Result Date: 07/06/2024 EXAM: CTA ABDOMEN AND PELVIS WITH AND WITHOUT IV CONTRAST 07/06/2024 03:32:12 AM TECHNIQUE: CTA images of the abdomen and pelvis with and without intravenous contrast. Three-dimensional MIP/volume rendered formations were performed. Automated exposure control, iterative reconstruction, and/or weight based adjustment of the mA/kV was utilized to reduce the radiation dose to as low as reasonably achievable. 100mL of iohexol  (OMNIPAQUE ) 350 MG/ML injection was used for contrast. COMPARISON: None available. CLINICAL HISTORY: Severe upper abdominal pain, history of iliac artery stenosis with stent. Pt states she is having upper abd pain and lower back pain. Pt unable to get in to see GI doc today. Endorses nausea with no vomiting. FINDINGS: VASCULATURE: AORTA: Extensive aortoiliac atherosclerotic calcification. No aneurysm or dissection. CELIAC TRUNK: Stenting of the celiac axis origin has been performed with a residual 50% stenosis of the celiac axis. This vessel remains patent, however. SUPERIOR MESENTERIC ARTERY: Focal dissection plan is seen at the  origin of the superior mesenteric artery. Distally the vessel is widely patent. No aneurysm. RENAL ARTERIES: Renal arteries are widely patent, demonstrate normal vascular morphology, and are free of aneurysmal dissection. 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 No follow up imaging recommended. ILIAC ARTERIES: Lower extremity arterial inflow is widely patent. LIVER: The liver is unremarkable. GALLBLADDER AND BILE DUCTS: Status post cholecystectomy. Stable extrahepatic biliary ductal dilation in keeping with post cholecystectomy change. SPLEEN: The spleen is unremarkable. PANCREAS: The pancreas is unremarkable. ADRENAL GLANDS: Stable 22 mm left adrenal adenoma. KIDNEYS, URETERS AND BLADDER: No stones in the kidneys or ureters. No hydronephrosis. No perinephric or periureteral stranding. Urinary bladder is unremarkable. GI AND BOWEL: Stomach and duodenal sweep demonstrate no acute abnormality. There is no bowel obstruction. No abnormal bowel wall thickening or distension. REPRODUCTIVE: Status post hysterectomy. No adnexal masses are seen. PERITONEUM AND RETRPERITONEUM: No ascites or free air. LUNG BASE: Visualized lung bases are clear. LYMPH NODES: No lymphadenopathy. BONES AND SOFT TISSUES: Osseous structures are age appropriate. No acute bone abnormality. No lytic or blastic bone lesion. HEART  AND MEDIASTINUM: Moderate right coronary artery calcification. cardiac size within normal limits. IMPRESSION: 1. Stenting of the celiac axis origin with residual 50% stenosis, vessel remains patent. 2. 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 Clinical correlation for signs and symptoms of chronic mesenteric ischemia may be helpful. Electronically signed by: Dorethia Molt MD 07/06/2024 03:51 AM EDT RP Workstation: [id number]   DG Shoulder Left Result Date: 07/03/2024 EXAM: 1 VIEW XRAY OF THE  LEFT SHOULDER 07/03/2024 02:40:00 PM COMPARISON: None available. CLINICAL HISTORY: fall, pain. FINDINGS: BONES AND JOINTS: Glenohumeral joint is normally aligned. No acute fracture or dislocation. Degenerative change of the acromioclavicular joint. SOFT TISSUES: No abnormal calcifications. Visualized lung is unremarkable. IMPRESSION: 1. No acute fracture or dislocation. 2. No acute osseous abnormality of the shoulder. Electronically signed by: Norleen Boxer MD 07/03/2024 03:19 PM EDT RP Workstation: [id number]   CT CHEST ABDOMEN PELVIS WO CONTRAST Result Date: 07/03/2024 CLINICAL DATA:  Abdominal and chest pain. EXAM: CT CHEST, ABDOMEN AND PELVIS WITHOUT CONTRAST TECHNIQUE: Multidetector CT imaging of the chest, abdomen and pelvis was performed following the standard protocol without IV contrast. RADIATION DOSE REDUCTION: This exam was performed according to the departmental dose-optimization program which includes automated exposure control, adjustment of the mA and/or kV according to patient size and/or use of iterative reconstruction technique. COMPARISON:  CT abdomen/pelvis dated 11/25/2023. CT chest dated 09/29/2023. FINDINGS: CT CHEST FINDINGS Cardiovascular: Normal heart size. No pericardial effusion. Multivessel coronary artery calcifications. Thoracic aorta is normal in caliber with atherosclerotic calcification. Mediastinum/Nodes: No enlarged mediastinal or axillary lymph nodes. Thyroid gland, trachea, and esophagus demonstrate no significant findings. Lungs/Pleura: Redemonstrated 10 x 9 mm left upper lobe peribronchial nodule (series 4, image 37), essentially unchanged compared to the prior exam. Unchanged 13 mm right upper lobe nodule (series 4, image 66). No new suspicious pulmonary nodule. No focal consolidation, pleural effusion, or pneumothorax. Musculoskeletal: No chest wall mass or suspicious bone lesions identified. CT ABDOMEN PELVIS FINDINGS Hepatobiliary: No suspicious focal hepatic lesion  identified within the limits of an unenhanced exam. Status post cholecystectomy. No biliary dilatation. Pancreas: Unremarkable. No pancreatic ductal dilatation or surrounding inflammatory changes. Spleen: Normal in size without focal abnormality. Adrenals/Urinary Tract: Stable 1.6 cm benign left adrenal adenoma, for which no follow-up imaging is recommended. The right adrenal gland is unremarkable. No renal or ureteral calculi. No hydronephrosis. Bladder is unremarkable. Stomach/Bowel: Stomach, small bowel, and colon are grossly unremarkable. No obstruction or inflammatory changes. Vascular/Lymphatic: Aortic atherosclerosis. Nonaneurysmal abdominal aorta. Evaluation of vascular structures is limited due to lack of intravenous contrast. No enlarged abdominal or pelvic lymph nodes. Reproductive: Status post hysterectomy. No adnexal masses. Other: No abdominopelvic ascites. No intraperitoneal free air. No abdominal wall hernia. Musculoskeletal: No acute osseous abnormality. No suspicious osseous lesion. Multilevel degenerative changes of the thoracolumbar spine. IMPRESSION: 1. No acute localizing findings in the chest, abdomen, or pelvis. 2. Unchanged 10 mm left upper lobe and 13 mm right upper lobe lung nodules, which remain suspicious for indolent bronchogenic carcinoma. Clinical correlation is recommended. 3. Coronary and Aortic Atherosclerosis (ICD10-I70.0). Electronically Signed   By: Harrietta Sherry M.D.   On: 07/03/2024 14:05   DG Chest 2 View Result Date: 07/03/2024 CLINICAL DATA:  Chest pain. EXAM: CHEST - 2 VIEW COMPARISON:  10/03/2023 FINDINGS: The cardiomediastinal contours are normal. Atherosclerosis of the aortic arch. Pulmonary vasculature is normal. No consolidation, pleural effusion, or pneumothorax. No acute osseous abnormalities are seen. IMPRESSION: No active cardiopulmonary disease. Electronically Signed  By: Andrea Gasman M.D.   On: 07/03/2024 12:32    Microbiology: Results for orders  placed or performed during the hospital encounter of 11/25/23  Resp panel by RT-PCR (RSV, Flu A&B, Covid) Anterior Nasal Swab     Status: None   Collection Time: 11/25/23 10:58 AM   Specimen: Anterior Nasal Swab  Result Value Ref Range Status   SARS Coronavirus 2 by RT PCR NEGATIVE NEGATIVE Final    Comment: (NOTE) SARS-CoV-2 target nucleic acids are NOT DETECTED.  The SARS-CoV-2 RNA is generally detectable in upper respiratory specimens during the acute phase of infection. The lowest concentration of SARS-CoV-2 viral copies this assay can detect is 138 copies/mL. A negative result does not preclude SARS-Cov-2 infection and should not be used as the sole basis for treatment or other patient management decisions. A negative result may occur with  improper specimen collection/handling, submission of specimen other than nasopharyngeal swab, presence of viral mutation(s) within the areas targeted by this assay, and inadequate number of viral copies(<138 copies/mL). A negative result must be combined with clinical observations, patient history, and epidemiological information. The expected result is Negative.  Fact Sheet for Patients:  BloggerCourse.com  Fact Sheet for Healthcare Providers:  SeriousBroker.it  This test is no t yet approved or cleared by the [place]  FDA and  has been authorized for detection and/or diagnosis of SARS-CoV-2 by FDA under an Emergency Use Authorization (EUA). This EUA will remain  in effect (meaning this test can be used) for the duration of the COVID-19 declaration under Section 564(b)(1) of the Act, 21 U.S.C.section 360bbb-3(b)(1), unless the authorization is terminated  or revoked sooner.       Influenza A by PCR NEGATIVE NEGATIVE Final   Influenza B by PCR NEGATIVE NEGATIVE Final    Comment: (NOTE) The Xpert Xpress SARS-CoV-2/FLU/RSV plus assay is intended as an aid in the diagnosis of  influenza from Nasopharyngeal swab specimens and should not be used as a sole basis for treatment. Nasal washings and aspirates are unacceptable for Xpert Xpress SARS-CoV-2/FLU/RSV testing.  Fact Sheet for Patients: BloggerCourse.com  Fact Sheet for Healthcare Providers: SeriousBroker.it  This test is not yet approved or cleared by the [place]  FDA and has been authorized for detection and/or diagnosis of SARS-CoV-2 by FDA under an Emergency Use Authorization (EUA). This EUA will remain in effect (meaning this test can be used) for the duration of the COVID-19 declaration under Section 564(b)(1) of the Act, 21 U.S.C. section 360bbb-3(b)(1), unless the authorization is terminated or revoked.     Resp Syncytial Virus by PCR NEGATIVE NEGATIVE Final    Comment: (NOTE) Fact Sheet for Patients: BloggerCourse.com  Fact Sheet for Healthcare Providers: SeriousBroker.it  This test is not yet approved or cleared by the [place]  FDA and has been authorized for detection and/or diagnosis of SARS-CoV-2 by FDA under an Emergency Use Authorization (EUA). This EUA will remain in effect (meaning this test can be used) for the duration of the COVID-19 declaration under Section 564(b)(1) of the Act, 21 U.S.C. section 360bbb-3(b)(1), unless the authorization is terminated or revoked.  Performed at [facility], [address] [place], [place] [address]   Culture, blood (Routine X 2) w Reflex to ID Panel     Status: None   Collection Time: 11/25/23  7:54 PM   Specimen: BLOOD LEFT ARM  Result Value Ref Range Status   Specimen Description BLOOD LEFT ARM  Final   Special Requests   Final  BOTTLES DRAWN AEROBIC ONLY Blood Culture results may not be optimal due to an inadequate volume of blood received in culture bottles   Culture   Final    NO GROWTH 5 DAYS Performed at  [facility], [address] [place], [place] [address]    Report Status 11/30/2023 FINAL  Final  Culture, blood (Routine X 2) w Reflex to ID Panel     Status: None   Collection Time: 11/25/23  8:53 PM   Specimen: BLOOD RIGHT HAND  Result Value Ref Range Status   Specimen Description BLOOD RIGHT HAND  Final   Special Requests   Final    BOTTLES DRAWN AEROBIC AND ANAEROBIC Blood Culture adequate volume   Culture   Final    NO GROWTH 5 DAYS Performed at [facility], [address] [place], [place] [address]    Report Status 11/30/2023 FINAL  Final    Labs: CBC: Recent Labs  Lab 07/03/24 1123 07/05/24 1201 07/05/24 1750 07/06/24 0525  WBC 6.3 8.1 7.0 8.8  NEUTROABS  --  5.3  --   --   HGB 11.0* 12.1 12.0 11.7*  HCT 35.6* 38.6 38.7 36.7  MCV 78.4* 77.8* 78.2* 76.9*  PLT 280 313 338 324   Basic Metabolic Panel: Recent Labs  Lab 07/03/24 1123 07/05/24 1201 07/05/24 1750 07/06/24 0525  NA 138 137  --  137  K 4.1 3.7  --  3.8  CL 105 102  --  104  CO2 21* 27  --  24  GLUCOSE 93 97  --  127*  BUN 13 11  --  13  CREATININE 1.01* 1.05* 0.92 0.78  CALCIUM  9.0 9.6  --  9.3   Liver Function Tests: Recent Labs  Lab 07/03/24 1123 07/05/24 1201 07/06/24 0525  AST 23 31 63*  ALT 16 63* 108*  ALKPHOS 44 47 67  BILITOT 0.5 0.6 0.3  PROT 7.1 7.5 7.3  ALBUMIN 3.7 3.8 3.9   CBG: No results for input(s): GLUCAP in the last 168 hours.  Discharge time spent: greater than 30 minutes.  Signed: AIDA CHO, MD [facility] 07/07/2024

## 2024-07-07 NOTE — TOC Transition Note (Signed)
 Transition of Care [facility]) - Discharge Note   Patient Details  Name: Teresa Sparks MRN: [id number] Date of Birth: August 12, 1951  Transition of [facility] CM/SW Contact:  Victory Jackquline RAMAN, RN Phone Number: 07/07/2024, 10:36 AM   Clinical Narrative:    Pt discharging home. No further concerns. RNCM Signing off.     Final next level of care: Home/Self Care Barriers to Discharge: No Barriers Identified   Patient Goals and CMS Choice            Discharge Placement                Patient to be transferred to facility by: DAUGHTER Name of family member notified: [name] Patient and family notified of of transfer: 07/07/24  Discharge Plan and Services Additional resources added to the After Visit Summary for                                       Social Drivers of Health (SDOH) Interventions SDOH Screenings   Food Insecurity: No Food Insecurity (07/05/2024)  Recent Concern: Food Insecurity - Food Insecurity Present (06/21/2024)   Received from [facility]  Housing: Low Risk  (07/05/2024)  Transportation Needs: No Transportation Needs (07/05/2024)  Utilities: Patient Declined (07/05/2024)  Financial Resource Strain: High Risk (06/21/2024)   Received from [facility]  Social Connections: Moderately Integrated (07/05/2024)  Tobacco Use: High Risk (07/05/2024)     Readmission Risk Interventions     No data to display

## 2024-07-07 NOTE — Progress Notes (Signed)
 Pt refusing CPAP use. RT will cont to follow as needed.

## 2024-07-07 NOTE — Plan of Care (Signed)

## 2024-07-09 ENCOUNTER — Encounter: Payer: Self-pay | Admitting: Vascular Surgery

## 2024-07-12 ENCOUNTER — Ambulatory Visit

## 2024-07-12 VITALS — BP 120/68 | HR 67 | Ht 69.0 in | Wt 226.0 lb

## 2024-07-12 DIAGNOSIS — I2510 Atherosclerotic heart disease of native coronary artery without angina pectoris: Secondary | ICD-10-CM

## 2024-07-12 DIAGNOSIS — R079 Chest pain, unspecified: Secondary | ICD-10-CM

## 2024-07-12 DIAGNOSIS — E782 Mixed hyperlipidemia: Secondary | ICD-10-CM | POA: Diagnosis not present

## 2024-07-12 DIAGNOSIS — Z79899 Other long term (current) drug therapy: Secondary | ICD-10-CM

## 2024-07-12 DIAGNOSIS — I739 Peripheral vascular disease, unspecified: Secondary | ICD-10-CM

## 2024-07-12 DIAGNOSIS — I479 Paroxysmal tachycardia, unspecified: Secondary | ICD-10-CM

## 2024-07-12 MED ORDER — CLOPIDOGREL BISULFATE 75 MG PO TABS: 75.0000 mg | ORAL_TABLET | Freq: Every day | ORAL | 3 refills | Status: AC

## 2024-07-12 NOTE — Progress Notes (Signed)
 Cardiology Office Note   Date:  07/12/2024  ID:  Teresa Sparks, Teresa Sparks 10-11-1950, MRN [id number] PCP: Teresa Lynwood FALCON, MD  [facility] Providers Cardiologist:  Teresa Poser, MD     History of Present Illness Teresa Sparks is a 73 y.o. female PMH HTN, PAD status post celiac artery stent 07/16/2024 and 02/13/2022, HLD who presents for further evaluation and management of chest discomfort.  Patient was recently discharged from [facility] on 07/07/24.  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  She ultimately underwent visceral angiography and this was treated with celiac artery angioplasty and stent.  A couple of days earlier, she had presented to the ED prompting a cardiology referral.  Serial troponin were negative.  Last LDL 52 01/2023.  On interview, she notes that the chest discomfort is distinct from the abdominal pain which brought her to the ED leading to percutaneous intervention.  Her abdominal pain is now resolved.  She still has intermittent chest discomfort that sometimes occurs with rest, sometimes with exertion.  She also notes significant palpitations that occur on a daily basis and cause her to stop what she is doing.  Relevant CVD History -Celiac artery stent placement 07/06/2024 and 02/05/2022 -CT chest 06/2024 with severe multivessel CAC and aortic atherosclerosis -ABI 04/2024 noncompressible right and left ABI 1.1   ROS: Pt denies any jaw pain, arm pain, palpitations, syncope, presyncope, orthopnea, PND, or LE edema.  Studies Reviewed I have independently reviewed the patient's ECG, previous medical records, previous cardiovascular testing, recent blood work.  Physical Exam VS:  BP 120/68 (BP Location: Left Arm, Patient Position: Sitting, Cuff Size: Normal)   Pulse 67   Ht 5' 9 (1.753 m)   Wt 226 lb (102.5 kg)   SpO2 98%   BMI  33.37 kg/m        Wt Readings from Last 3 Encounters:  07/12/24 226 lb (102.5 kg)  07/05/24 227 lb 1.2 oz (103 kg)  07/03/24 229 lb (103.9 kg)    GEN: No acute distress. NECK: No JVD; No carotid bruits. CARDIAC: RRR, no murmurs, rubs, gallops. RESPIRATORY:  Clear to auscultation. EXTREMITIES:  Warm and well-perfused. No edema.  ASSESSMENT AND PLAN Chest discomfort Patient presents with significant chest discomfort that has undifferentiated.  She has never had any cardiac testing on file.  She has significant multivessel coronary calcium  as evidenced by recent CT scan.  I think a coronary CT would be somewhat difficult to interpret given this.  Plan: - Stress PET to further stratify her chest pain - Echocardiogram to evaluate for structural etiology  CAC Aortic atherosclerosis PAD HLD Patient has significant PAD now requiring 2 interventions on her celiac artery.  Recent chest CT also shows significant aortic atherosclerosis and severe multivessel CAC.  Her LDL goal should be less than 55 given her atherosclerosis burden.  Last LDL 52 01/2023.  Plan: - Continue ASA 81 mg daily - Start Plavix  75 mg daily given significant PAD - Continue Lipitor 10 mg daily.  Will recheck lipids.  Uptitrate Lipitor to meet goal LDL less than 55  Paroxysmal tachycardia Palpitations Patient notes frequent palpitations that occur on a near daily basis.  She is quite symptomatic from this. No syncope.  Plan: - Zio monitor to evaluate for arrhythmia - As above, echocardiogram to evaluate for structural causes     Informed Consent  The risks [chest pain, shortness of breath, cardiac arrhythmias, dizziness, blood pressure fluctuations, myocardial infarction, stroke/transient ischemic attack, nausea, vomiting, allergic reaction, radiation exposure, metallic taste sensation and life-threatening complications (estimated to be 1 in 10,000)], benefits (risk stratification, diagnosing coronary artery  disease, treatment guidance) and alternatives of a cardiac PET stress test were discussed in detail with Teresa Sparks and she agrees to proceed.     Dispo: RTC 6 months or sooner as needed  Signed, Teresa Poser, MD

## 2024-07-12 NOTE — Patient Instructions (Addendum)
 Medication Instructions:  Your physician recommends that you continue on your current medications as directed.   AND START :  Plavix  75 mg by mouth once a day   *If you need a refill on your cardiac medications before your next appointment, please call your pharmacy*  Lab Work:  Your provider would like for you to return and have the following labs drawn: TSH, LIPID Panel.   Please go to [facility] [address] ([facility]) [address], [place] [address] You do not need an appointment.  They are open from 8 am- 4:30 pm.  Lunch from 1:00 pm- 2:00 pm You DO NOT need to be fasting.   If you have labs (blood work) drawn today and your tests are completely normal, you will receive your results only by: MyChart Message (if you have MyChart) OR A paper copy in the mail If you have any lab test that is abnormal or we need to change your treatment, we will call you to review the results.  Testing/Procedures:   Please report to Radiology at [facility] Main Entrance, medical mall, 30 mins prior to your test.  [address]  [place], [place]  How to Prepare for Your Cardiac PET/CT Stress Test:  Nothing to eat or drink, except water, 3 hours prior to arrival time.  NO caffeine/decaffeinated products, or chocolate 12 hours prior to arrival. (Please note decaffeinated beverages (teas/coffees) still contain caffeine).  If you have caffeine within 12 hours prior, the test will need to be rescheduled.  Medication instructions:    HOLD Lisinopril -Hydrochlorothiazide  until after the PET SCAN  You may take your remaining medications with water.  NO perfume, cologne or lotion on chest or abdomen area. FEMALES - Please avoid wearing dresses to this appointment.  Total time is 1 to 2 hours; you may want to bring reading material for the waiting time.  In preparation for your appointment, medication and supplies will be purchased.  Appointment  availability is limited, so if you need to cancel or reschedule, please call the Radiology Department Scheduler at [phone] 24 hours in advance to avoid a cancellation fee of $100.00  What to Expect When you Arrive:  Once you arrive and check in for your appointment, you will be taken to a preparation room within the Radiology Department.  A technologist or Nurse will obtain your medical history, verify that you are correctly prepped for the exam, and explain the procedure.  Afterwards, an IV will be started in your arm and electrodes will be placed on your skin for EKG monitoring during the stress portion of the exam. Then you will be escorted to the PET/CT scanner.  There, staff will get you positioned on the scanner and obtain a blood pressure and EKG.  During the exam, you will continue to be connected to the EKG and blood pressure machines.  A small, safe amount of a radioactive tracer will be injected in your IV to obtain a series of pictures of your heart along with an injection of a stress agent.    After your Exam:  It is recommended that you eat a meal and drink a caffeinated beverage to counter act any effects of the stress agent.  Drink plenty of fluids for the remainder of the day and urinate frequently for the first couple of hours after the exam.  Your doctor will inform you of your test results within 7-10 business days.  For more information and frequently asked questions,  please visit our website: [email or web address]  For questions about your test or how to prepare for your test, please call: Cardiac Imaging Nurse Navigators Office: [phone]   Your physician has requested that you have an echocardiogram. Echocardiography is a painless test that uses sound waves to create images of your heart. It provides your doctor with information about the size and shape of your heart and how well your heart's chambers and valves are working.   You may receive an  ultrasound enhancing agent through an IV if needed to better visualize your heart during the echo. This procedure takes approximately one hour.  There are no restrictions for this procedure.  This will take place at [address] [facility]) [address], [place] [address]  Please note: We ask at that you not bring children with you during ultrasound (echo/ vascular) testing. Due to room size and safety concerns, children are not allowed in the ultrasound rooms during exams. Our front office staff cannot provide observation of children in our lobby area while testing is being conducted. An adult accompanying a patient to their appointment will only be allowed in the ultrasound room at the discretion of the ultrasound technician under special circumstances. We apologize for any inconvenience.   ZIO XT- Long Term Monitor Instructions  Your physician has requested you wear a ZIO patch monitor for 14 days.  This is a single patch monitor. [employer] supplies one patch monitor per enrollment. Additional stickers are not available. Please do not apply patch if you will be having a Nuclear Stress Test, Echocardiogram, Cardiac CT, MRI, or Chest Xray during the period you would be wearing the monitor. The patch cannot be worn during these tests. You cannot remove and re-apply the ZIO XT patch monitor.  Your ZIO patch monitor will be mailed 3 day USPS to your address on file. It may take 3-5 days to receive your monitor after you have been enrolled. Once you have received your monitor, please review the enclosed instructions. Your monitor has already been registered assigning a specific monitor serial number to you.  Billing and Patient Assistance Program Information  We have supplied [employer] with any of your insurance information on file for billing purposes.  [employer] offers a sliding scale Patient Assistance Program for patients that do not have insurance, or whose insurance does not completely cover  the cost of the ZIO monitor.  You must apply for the Patient Assistance Program to qualify for this discounted rate.  To apply, please call [employer] at [phone], select option 4, select option 2, ask to apply for Patient Assistance Program. Meredeth will ask your household income, and how many people are in your household. They will quote your out-of-pocket cost based on that information. [employer] will also be able to set up a 33-month, interest-free payment plan if needed.  Applying the monitor   Shave hair from upper left chest.  Hold abrader disc by orange tab. Rub abrader in 40 strokes over the upper left chest as indicated in your monitor instructions.  Clean area with 4 enclosed alcohol pads. Let dry.  Apply patch as indicated in monitor instructions. Patch will be placed under collarbone on left side of chest with arrow pointing upward.  Rub patch adhesive wings for 2 minutes. Remove white label marked 1. Remove the white label marked 2. Rub patch adhesive wings for 2 additional minutes.  While looking in a mirror, press and release button in center of patch. A small green light will flash  3-4 times. This will be your only indicator that the monitor has been turned on.   After Applying Monitor: Do not shower for the first 24 hours. You may shower after the first 24 hours.  Press the button if you feel a symptom. You will hear a small click. Record Date, Time and Symptom in the Patient Logbook.   After Completing 14 Days: When you are ready to remove the patch, follow instructions on the last 2 pages of Patient Logbook.  Stick patch monitor into the tabs at the bottom of the return box.  Place Patient Logbook in the blue and white box. Use locking tab on box and tape box closed securely. The blue and white box has prepaid postage on it. Please place it in the mailbox as soon as possible. Your physician should have your test results approximately 7-14 days after the monitor has been  mailed back to [facility].   Troubleshooting: Call [facility] at [phone] if you have questions regarding your ZIO XT patch monitor.  Call them immediately if you see an orange light blinking on your monitor.  If your monitor falls off in less than 4 days, contact our Monitor department at [phone].  If your monitor becomes loose or falls off after 4 days call [employer] at [phone] for suggestions on securing your monitor.   Follow-Up: At [facility], you and your health needs are our priority.  As part of our continuing mission to provide you with exceptional heart care, our providers are all part of one team.  This team includes your primary Cardiologist (physician) and Advanced Practice Providers or APPs (Physician Assistants and Nurse Practitioners) who all work together to provide you with the care you need, when you need it.  Your next appointment:   6 month(s)  Provider:   You may see Caron Poser, MD or one of the following Advanced Practice Providers on your designated Care Team:   Lonni Meager, NP Lesley Maffucci, PA-C Bernardino Bring, PA-C Cadence Rosholt, PA-C Tylene Lunch, NP Barnie Hila, NP  We recommend signing up for the patient portal called MyChart.  Sign up information is provided on this After Visit Summary.  MyChart is used to connect with patients for Virtual Visits (Telemedicine).  Patients are able to view lab/test results, encounter notes, upcoming appointments, etc.  Non-urgent messages can be sent to your provider as well.   To learn more about what you can do with MyChart, go to [email or web address].

## 2024-07-17 ENCOUNTER — Telehealth: Payer: Self-pay

## 2024-07-17 NOTE — Telephone Encounter (Signed)
 Pt called in stating she received heart monitor but unsure how to put on. She states she is coming for labs tomorrow morning and asked if someone would be available after to help her put it on. Please advise.

## 2024-07-17 NOTE — Telephone Encounter (Signed)
 Spoke with the patient, who requested to come in to have her monitor placed. Patient was advised to arrive by 8:30 AM, and a nurse will assist with monitor placement. Patient verbalized understanding.

## 2024-07-18 ENCOUNTER — Other Ambulatory Visit: Payer: Self-pay

## 2024-07-18 DIAGNOSIS — Z79899 Other long term (current) drug therapy: Secondary | ICD-10-CM

## 2024-07-19 ENCOUNTER — Ambulatory Visit: Payer: Self-pay

## 2024-07-19 LAB — LIPID PANEL
Chol/HDL Ratio: 2 ratio (ref 0.0–4.4)
Cholesterol, Total: 138 mg/dL (ref 100–199)
HDL: 70 mg/dL (ref >39)
LDL Chol Calc (NIH): 50 mg/dL (ref 0–99)
Triglycerides: 98 mg/dL (ref 0–149)
VLDL Cholesterol Cal: 18 mg/dL (ref 5–40)

## 2024-07-19 LAB — TSH: TSH: 2.16 u[IU]/mL (ref 0.450–4.500)

## 2024-07-23 ENCOUNTER — Telehealth (INDEPENDENT_AMBULATORY_CARE_PROVIDER_SITE_OTHER): Payer: Self-pay | Admitting: Vascular Surgery

## 2024-07-23 NOTE — Telephone Encounter (Signed)
 Patient had visceral angiography on 07/06/24

## 2024-07-23 NOTE — Telephone Encounter (Signed)
 She should see GS with a mesenteric

## 2024-07-23 NOTE — Telephone Encounter (Signed)
 Patient called stating she is having pain at the top of her stomach.  States she thought it would go away after surgery, but it hasn't.  When she tries to eat it makes her nauseated and it hurts.  Patient states she breaks out in a sweat. Please advise what Should be scheduled for pt.

## 2024-07-23 NOTE — Telephone Encounter (Signed)
 Patient called stating she is having pain at the top of her stomach. States she thought it would go away after surgery, but it hasn't. When she tries to eat it makes her nauseated and it hurts. Patient states she breaks out in a sweat.

## 2024-07-25 ENCOUNTER — Other Ambulatory Visit (INDEPENDENT_AMBULATORY_CARE_PROVIDER_SITE_OTHER)

## 2024-07-25 DIAGNOSIS — I774 Celiac artery compression syndrome: Secondary | ICD-10-CM

## 2024-07-26 ENCOUNTER — Ambulatory Visit
Admission: RE | Admit: 2024-07-26 | Discharge: 2024-07-26 | Disposition: A | Discharge status: Home / Self Care | Source: Ambulatory Visit

## 2024-07-26 ENCOUNTER — Telehealth: Payer: Self-pay

## 2024-07-26 ENCOUNTER — Other Ambulatory Visit (INDEPENDENT_AMBULATORY_CARE_PROVIDER_SITE_OTHER): Payer: Self-pay | Admitting: Nurse Practitioner

## 2024-07-26 ENCOUNTER — Telehealth (INDEPENDENT_AMBULATORY_CARE_PROVIDER_SITE_OTHER): Payer: Self-pay

## 2024-07-26 DIAGNOSIS — I2510 Atherosclerotic heart disease of native coronary artery without angina pectoris: Secondary | ICD-10-CM | POA: Insufficient documentation

## 2024-07-26 DIAGNOSIS — Z85118 Personal history of other malignant neoplasm of bronchus and lung: Secondary | ICD-10-CM | POA: Diagnosis not present

## 2024-07-26 DIAGNOSIS — R079 Chest pain, unspecified: Secondary | ICD-10-CM | POA: Diagnosis present

## 2024-07-26 DIAGNOSIS — R918 Other nonspecific abnormal finding of lung field: Secondary | ICD-10-CM | POA: Diagnosis not present

## 2024-07-26 LAB — NM PET CT CARDIAC PERFUSION MULTI W/ABSOLUTE BLOODFLOW
LV dias vol: 81 mL (ref 46–106)
LV sys vol: 21 mL (ref 3.8–5.2)
MBFR: 2.26
Nuc Rest EF: 47 %
Nuc Stress EF: 74 %
Peak HR: 75 {beats}/min
Rest HR: 62 {beats}/min
Rest MBF: 0.91 ml/g/min
Rest Nuclear Isotope Dose: 25 mCi
SRS: 0
SSS: 0
ST Depression (mm): 0 mm
Stress MBF: 2.06 ml/g/min
Stress Nuclear Isotope Dose: 25.1 mCi
TID: 0.91

## 2024-07-26 MED ORDER — CAFFEINE CITRATE BASE COMPONENT 10 MG/ML IV SOLN
INTRAVENOUS | Status: AC
  Filled 2024-07-26: qty 3

## 2024-07-26 MED ORDER — DEXTROSE 5 % IV SOLN
INTRAVENOUS | Status: AC
  Filled 2024-07-26: qty 50

## 2024-07-26 MED ORDER — RUBIDIUM RB82 GENERATOR (RUBYFILL)
25.0000 | PACK | Freq: Once | INTRAVENOUS | Status: AC
  Administered 2024-07-26: 25 via INTRAVENOUS

## 2024-07-26 MED ORDER — REGADENOSON 0.4 MG/5ML IV SOLN
INTRAVENOUS | Status: AC
  Filled 2024-07-26: qty 5

## 2024-07-26 MED ORDER — REGADENOSON 0.4 MG/5ML IV SOLN
0.4000 mg | Freq: Once | INTRAVENOUS | Status: AC
  Administered 2024-07-26: 0.4 mg via INTRAVENOUS
  Filled 2024-07-26: qty 5

## 2024-07-26 MED ORDER — RUBIDIUM RB82 GENERATOR (RUBYFILL)
25.0500 | PACK | Freq: Once | INTRAVENOUS | Status: AC
  Administered 2024-07-26: 25.05 via INTRAVENOUS

## 2024-07-26 NOTE — Telephone Encounter (Signed)
 Patient called into nurse line with complaint at top of stomach where stent placed. She reports pain is a 9/10  for the past 3 weeks on and off she reports area is not warm or red at this point she confirms her pharmacy is [facility] and is requesting something be sent in for her.

## 2024-07-26 NOTE — Telephone Encounter (Signed)
 Addendum on report for PET CT. Please advise

## 2024-07-27 ENCOUNTER — Encounter
Admission: RE | Admit: 2024-07-27 | Discharge: 2024-07-27 | Disposition: A | Discharge status: Home / Self Care | Source: Ambulatory Visit | Attending: Gastroenterology | Admitting: Gastroenterology

## 2024-07-27 ENCOUNTER — Other Ambulatory Visit (INDEPENDENT_AMBULATORY_CARE_PROVIDER_SITE_OTHER): Payer: Self-pay | Admitting: Nurse Practitioner

## 2024-07-27 DIAGNOSIS — R1013 Epigastric pain: Secondary | ICD-10-CM | POA: Diagnosis present

## 2024-07-27 DIAGNOSIS — K219 Gastro-esophageal reflux disease without esophagitis: Secondary | ICD-10-CM | POA: Insufficient documentation

## 2024-07-27 MED ORDER — OXYCODONE-ACETAMINOPHEN 5-325 MG PO TABS: 1.0000 | ORAL_TABLET | ORAL | 0 refills | Status: DC | PRN

## 2024-07-27 MED ORDER — TECHNETIUM TC 99M SULFUR COLLOID
2.1300 | Freq: Once | INTRAVENOUS | Status: AC | PRN
Start: 2024-07-27 — End: 2024-07-27
  Administered 2024-07-27: 2.13 via ORAL

## 2024-07-27 NOTE — Telephone Encounter (Signed)
 Called and spoke [facility] Radiology. They stated that there needs to be an addendum to the patient's PET results. They have attempted to make the addendum but the correct results have not gone through. They will be putting a STAT request into IT.   Please note that the current PET scan results for this patient are not correct, per [facility] Radiology.

## 2024-07-30 ENCOUNTER — Telehealth (INDEPENDENT_AMBULATORY_CARE_PROVIDER_SITE_OTHER): Payer: Self-pay | Admitting: Vascular Surgery

## 2024-07-30 ENCOUNTER — Encounter (INDEPENDENT_AMBULATORY_CARE_PROVIDER_SITE_OTHER): Payer: Self-pay | Admitting: Vascular Surgery

## 2024-07-30 ENCOUNTER — Ambulatory Visit (INDEPENDENT_AMBULATORY_CARE_PROVIDER_SITE_OTHER): Admitting: Vascular Surgery

## 2024-07-30 VITALS — BP 127/73 | HR 60 | Resp 18 | Wt 224.2 lb

## 2024-07-30 DIAGNOSIS — I10 Essential (primary) hypertension: Secondary | ICD-10-CM | POA: Diagnosis not present

## 2024-07-30 DIAGNOSIS — E782 Mixed hyperlipidemia: Secondary | ICD-10-CM | POA: Diagnosis not present

## 2024-07-30 DIAGNOSIS — I70213 Atherosclerosis of native arteries of extremities with intermittent claudication, bilateral legs: Secondary | ICD-10-CM

## 2024-07-30 DIAGNOSIS — I774 Celiac artery compression syndrome: Secondary | ICD-10-CM

## 2024-07-30 NOTE — Telephone Encounter (Signed)
This was sent on Friday

## 2024-07-30 NOTE — Telephone Encounter (Signed)
 Medication has been filled at pharmacy

## 2024-07-30 NOTE — Telephone Encounter (Signed)
 Patient states she will call later to schedule her 6 month fu appointments  Fu 6 months + Mesenteric see GS/FB

## 2024-07-30 NOTE — Progress Notes (Unsigned)
 MRN : [id number]  Teresa MARQUINA is a 73 y.o. ([birth year]) female who presents with chief complaint of check circulation.  History of Present Illness:   The patient returns to the office for follow-up regarding chronic mesenteric ischemia.  The patient is status post angiography with intervention of the celiac artery.  Procedure date 07/06/2024:   Percutaneous transluminal angioplasty and stent placement of the celiac artery   The patient reports that since the intervention the abdominal pain and postprandial symptoms are much better.  The patient denies further weight loss and the previous chronic nausea is improved.  The patient does not substantiate food fear.  The patient denies bloody bowel movements or diarrhea.    No history of peptic ulcer disease.   No prior peripheral angiograms or vascular interventions.  The patient denies amaurosis fugax or recent TIA symptoms. There are no recent neurological changes noted. The patient denies claudication symptoms or rest pain symptoms. The patient denies history of DVT, PE or superficial thrombophlebitis. The patient denies recent episodes of angina.  Duplex ultrasound of the mesenteric arteries dated July 25, 2024 demonstrates a widely patent celiac stent with a widely patent SMA.  No outpatient medications have been marked as taking for the 07/30/24 encounter (Appointment) with Jama, Cordella MATSU, MD.    Past Medical History:  Diagnosis Date   Adenomatous colon polyp    Adrenal adenoma    Allergic genetic state    Arthritis    Chronic abdominal pain    GERD (gastroesophageal reflux disease)    Hiatal hernia    Hypertension    Sleep apnea     Past Surgical History:  Procedure Laterality Date   ABDOMINAL HYSTERECTOMY     CHOLECYSTECTOMY     COLONOSCOPY     COLONOSCOPY WITH PROPOFOL  N/A 07/24/2018   Procedure: COLONOSCOPY WITH PROPOFOL ;   Surgeon: Viktoria Lamar DASEN, MD;  Location: [facility] ENDOSCOPY;  Service: Endoscopy;  Laterality: N/A;   COLONOSCOPY WITH PROPOFOL  N/A 11/23/2023   Procedure: COLONOSCOPY WITH PROPOFOL ;  Surgeon: Toledo, Ladell POUR, MD;  Location: [facility] ENDOSCOPY;  Service: Gastroenterology;  Laterality: N/A;   ENDOBRONCHIAL ULTRASOUND Bilateral 09/30/2023   Procedure: ENDOBRONCHIAL ULTRASOUND;  Surgeon: Tamea Dedra CROME, MD;  Location: [facility] ORS;  Service: Pulmonary;  Laterality: Bilateral;   ESOPHAGOGASTRODUODENOSCOPY (EGD) WITH PROPOFOL  N/A 11/23/2023   Procedure: ESOPHAGOGASTRODUODENOSCOPY (EGD) WITH PROPOFOL ;  Surgeon: Toledo, Ladell POUR, MD;  Location: [facility] ENDOSCOPY;  Service: Gastroenterology;  Laterality: N/A;   FRACTURE SURGERY     HEMOSTASIS CLIP PLACEMENT  11/23/2023   Procedure: HEMOSTASIS CLIP PLACEMENT;  Surgeon: Aundria, Teodoro K, MD;  Location: [facility] ENDOSCOPY;  Service: Gastroenterology;;   HOT HEMOSTASIS  11/23/2023   Procedure: HOT HEMOSTASIS (ARGON PLASMA COAGULATION/BICAP);  Surgeon: Aundria, Ladell POUR, MD;  Location: [facility] ENDOSCOPY;  Service: Gastroenterology;;   JOINT REPLACEMENT Right    6 years ago   POLYPECTOMY  11/23/2023   Procedure: POLYPECTOMY;  Surgeon: Aundria, Ladell POUR, MD;  Location: [facility] ENDOSCOPY;  Service: Gastroenterology;;   VISCERAL ANGIOGRAPHY N/A 02/05/2022   Procedure: VISCERAL ANGIOGRAPHY;  Surgeon: Jama Cordella MATSU, MD;  Location:  [facility] INVASIVE CV LAB;  Service: Cardiovascular;  Laterality: N/A;   VISCERAL ANGIOGRAPHY N/A 10/04/2023   Procedure: VISCERAL ANGIOGRAPHY;  Surgeon: Jama Cordella MATSU, MD;  Location: [facility] INVASIVE CV LAB;  Service: Cardiovascular;  Laterality: N/A;   VISCERAL ANGIOGRAPHY N/A 11/25/2023   Procedure: VISCERAL ANGIOGRAPHY;  Surgeon: Jama Cordella MATSU, MD;  Location: [facility] INVASIVE CV LAB;  Service: Cardiovascular;  Laterality: N/A;   VISCERAL ANGIOGRAPHY N/A 07/06/2024   Procedure: VISCERAL ANGIOGRAPHY;  Surgeon: Jama Cordella MATSU, MD;  Location: [facility]  INVASIVE CV LAB;  Service: Cardiovascular;  Laterality: N/A;    Social History Social History   Tobacco Use   Smoking status: Former    Average packs/day: 0.3 packs/day for 2.0 years (0.5 ttl pk-yrs)    Types: Cigarettes    Start date: 1971   Smokeless tobacco: Current    Types: Snuff  Vaping Use   Vaping status: Never Used  Substance Use Topics   Alcohol use: No   Drug use: Never    Family History Family History  Problem Relation Age of Onset   Hypertension Mother    Stroke Mother    Epilepsy Mother    Colon polyps Sister    Breast cancer Other     Allergies  Allergen Reactions   Iodinated Contrast Media Anaphylaxis    Other reaction(s): Vomiting   Ciprofloxacin Diarrhea and Nausea Only    Nausea, vomiting  Other reaction(s): Vomiting   Codeine Sulfate [Codeine] Diarrhea    Nausea, vomiting   Meloxicam    Penicillins Nausea Only   Protonix  [Pantoprazole  Sodium] Hives   Shellfish Allergy Swelling   Sulfa Antibiotics Rash     REVIEW OF SYSTEMS (Negative unless checked)  Constitutional: [] Weight loss  [] Fever  [] Chills Cardiac: [] Chest pain   [] Chest pressure   [] Palpitations   [] Shortness of breath when laying flat   [] Shortness of breath with exertion. Vascular:  [x] Pain in legs with walking   [] Pain in legs at rest  [] History of DVT   [] Phlebitis   [] Swelling in legs   [] Varicose veins   [] Non-healing ulcers Pulmonary:   [] Uses home oxygen   [] Productive cough   [] Hemoptysis   [] Wheeze  [] COPD   [] Asthma Neurologic:  [] Dizziness   [] Seizures   [] History of stroke   [] History of TIA  [] Aphasia   [] Vissual changes   [] Weakness or numbness in arm   [] Weakness or numbness in leg Musculoskeletal:   [] Joint swelling   [] Joint pain   [] Low back pain Hematologic:  [] Easy bruising  [] Easy bleeding   [] Hypercoagulable state   [] Anemic Gastrointestinal:  [] Diarrhea   [] Vomiting  [] Gastroesophageal reflux/heartburn   [] Difficulty swallowing. Genitourinary:  [] Chronic  kidney disease   [] Difficult urination  [] Frequent urination   [] Blood in urine Skin:  [] Rashes   [] Ulcers  Psychological:  [] History of anxiety   []  History of major depression.  Physical Examination  There were no vitals filed for this visit. There is no height or weight on file to calculate BMI. Gen: WD/WN, NAD Head: [place]/AT, No temporalis wasting.  Ear/Nose/Throat: Hearing grossly intact, nares w/o erythema or drainage Eyes: PER, EOMI, sclera nonicteric.  Neck: Supple, no masses.  No bruit or JVD.  Pulmonary:  Good air movement, no audible wheezing, no use of accessory muscles.  Cardiac: RRR, normal S1, S2, no Murmurs. Vascular:  mild trophic changes, no open wounds Vessel Right Left  Radial Palpable Palpable  PT Not Palpable Not Palpable  DP Not Palpable Not Palpable  Gastrointestinal: soft, non-distended. No guarding/no peritoneal signs.  Musculoskeletal: M/S 5/5 throughout.  No visible deformity.  Neurologic: CN 2-12 intact. Pain and light touch intact in extremities.  Symmetrical.  Speech is fluent. Motor exam as listed above. Psychiatric: Judgment intact, Mood & affect appropriate for pt's clinical situation. Dermatologic: No rashes or ulcers noted.  No changes consistent with cellulitis.   CBC Lab Results  Component Value Date   WBC 8.8 07/06/2024   HGB 11.7 (L) 07/06/2024   HCT 36.7 07/06/2024   MCV 76.9 (L) 07/06/2024   PLT 324 07/06/2024    BMET    Component Value Date/Time   NA 137 07/06/2024 0525   NA 138 05/08/2014 0151   K 3.8 07/06/2024 0525   K 3.8 05/08/2014 0151   CL 104 07/06/2024 0525   CL 106 05/08/2014 0151   CO2 24 07/06/2024 0525   CO2 23 05/08/2014 0151   GLUCOSE 127 (H) 07/06/2024 0525   GLUCOSE 97 05/08/2014 0151   BUN 13 07/06/2024 0525   BUN 16 05/08/2014 0151   CREATININE 0.78 07/06/2024 0525   CREATININE 1.25 05/08/2014 0151   CALCIUM  9.3 07/06/2024 0525   CALCIUM  8.8 05/08/2014 0151   GFRNONAA >60 07/06/2024 0525   GFRNONAA 46  (L) 05/08/2014 0151   GFRAA 56 (L) 04/23/2018 0901   GFRAA 54 (L) 05/08/2014 0151   CrCl cannot be calculated (Patient's most recent lab result is older than the maximum 21 days allowed.).  COAG Lab Results  Component Value Date   INR 1.1 07/06/2024   INR 1.0 07/05/2024   INR 1.0 10/03/2023    Radiology NM PET CT CARDIAC PERFUSION MULTI W/ABSOLUTE BLOODFLOW Result Date: 07/26/2024   LV perfusion is normal.   Rest left ventricular function is abnormal. Rest global function is mildly reduced. Rest EF: 47%. Stress left ventricular function is normal. Stress EF: 74%. End diastolic cavity size is normal. End systolic cavity size is normal.   Myocardial blood flow was computed to be 0.76ml/g/min at rest and 2.06ml/g/min at stress. Global myocardial blood flow reserve was 2.26 and was normal.   Coronary calcium  was present on the attenuation correction CT images. Severe coronary calcifications were present. Coronary calcifications were present in the left anterior descending artery, left circumflex artery and right coronary artery distribution(s).   The study is normal. The study is low risk.   Electronically signed by: Lonni Hanson, MD. CLINICAL DATA:  This over-read does not include interpretation of cardiac or coronary anatomy or pathology. No interpretation the PET data set. The cardiac PET-CT interpretation by the cardiologist is attached. History of lung cancer COMPARISON:  CT 05/12/1999 FINDINGS: Limited view of the lung parenchyma demonstrates a band nodular thickening in the posterior LEFT upper lobe. The nodular portion measures 18 mm x 11 mm compared to 11 x 10 mm on comparison CT. Airways are normal. Limited view of the mediastinum demonstrates no adenopathy. Esophagus normal. Limited view of the upper abdomen unremarkable. Limited view of the skeleton and chest wall is unremarkable. IMPRESSION: Nodular thickening in the posterior LEFT upper lobe is increased in size from comparison CT.  Differential includes progressive radiation change versus tumor recurrence. Recommend referring back to oncology for management. These results will be called to the ordering clinician or representative by the Radiologist Assistant, and communication documented in the PACS or [employer]. Electronically Signed   By: Jackquline Boxer M.D.   On: 07/26/2024 14:34  VAS US  MESENTERIC Result Date: 07/26/2024 ABDOMINAL VISCERAL Patient Name:  LAFRANCES CHRISTELLA BROWNER  Date of Exam:   07/25/2024 Medical Rec #: [id number]            Accession #:    [id number] Date of Birth: 1951-08-23            Patient Gender: F Patient Age:   63 years Exam Location:  [facility] Procedure:      VAS US  MESENTERIC Referring Phys: [id number] CORDELLA MATSU [place] -------------------------------------------------------------------------------- Indications: Abdominal Pain/Discomfort High Risk Factors: Hypertension. Vascular Interventions: Celiac artery stent on 02/05/22. Comparison Study: 04/30/2024 Performing Technologist: Leafy Gibes RVS  Examination Guidelines: A complete evaluation includes B-mode imaging, spectral Doppler, color Doppler, and power Doppler as needed of all accessible portions of each vessel. Bilateral testing is considered an integral part of a complete examination. Limited examinations for reoccurring indications may be performed as noted.  Duplex Findings: +----------------------+--------+--------+------+--------+ Mesenteric            PSV cm/sEDV cm/sPlaqueComments +----------------------+--------+--------+------+--------+ Aorta Prox               41      16                  +----------------------+--------+--------+------+--------+ Aorta Mid                75      13                  +----------------------+--------+--------+------+--------+ Aorta Distal             97      14                  +----------------------+--------+--------+------+--------+ Celiac Artery Origin    231       42                  +----------------------+--------+--------+------+--------+ Celiac Artery Proximal  258      44                  +----------------------+--------+--------+------+--------+ SMA Origin              118      18                  +----------------------+--------+--------+------+--------+ SMA Proximal            129      23                  +----------------------+--------+--------+------+--------+ SMA Mid                 155      24                  +----------------------+--------+--------+------+--------+ SMA Distal              137      21                  +----------------------+--------+--------+------+--------+ CHA                     145      23                  +----------------------+--------+--------+------+--------+ Splenic                  89      10                  +----------------------+--------+--------+------+--------+  Summary: Largest Aortic Diameter: 2.0 cm  Mesenteric: Normal Superior Mesenteric artery and Splenic artery findings. 70 to 99% stenosis in the celiac artery.  *See table(s) above for measurements and observations.  Diagnosing physician: Cordella Shawl MD  Electronically signed by Cordella Shawl MD on 07/26/2024 at 8:06:52 AM.    Final    PERIPHERAL VASCULAR CATHETERIZATION Result Date: 07/06/2024 See surgical note for result.  CT Angio Abd/Pel W and/or Wo Contrast Result Date: 07/06/2024 EXAM: CTA ABDOMEN AND PELVIS WITH AND WITHOUT IV CONTRAST 07/06/2024 03:32:12 AM TECHNIQUE: CTA images of the abdomen and pelvis with and without intravenous contrast. Three-dimensional MIP/volume rendered formations were performed. Automated exposure control, iterative reconstruction, and/or weight based adjustment of the mA/kV was utilized to reduce the radiation dose to as low as reasonably achievable. 100mL of iohexol  (OMNIPAQUE ) 350 MG/ML injection was used for contrast. COMPARISON: None available. CLINICAL HISTORY: Severe  upper abdominal pain, history of iliac artery stenosis with stent. Pt states she is having upper abd pain and lower back pain. Pt unable to get in to see GI doc today. Endorses nausea with no vomiting. FINDINGS: VASCULATURE: AORTA: Extensive aortoiliac atherosclerotic calcification. No aneurysm or dissection. CELIAC TRUNK: Stenting of the celiac axis origin has been performed with a residual 50% stenosis of the celiac axis. This vessel remains patent, however. 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 Distally the vessel is widely patent. No aneurysm. RENAL ARTERIES: Renal arteries are widely patent, demonstrate normal vascular morphology, and are free of aneurysmal dissection. 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 No follow up imaging recommended. ILIAC ARTERIES: Lower extremity arterial inflow is widely patent. LIVER: The liver is unremarkable. GALLBLADDER AND BILE DUCTS: Status post cholecystectomy. Stable extrahepatic biliary ductal dilation in keeping with post cholecystectomy change. SPLEEN: The spleen is unremarkable. PANCREAS: The pancreas is unremarkable. ADRENAL GLANDS: Stable 22 mm left adrenal adenoma. KIDNEYS, URETERS AND BLADDER: No stones in the kidneys or ureters. No hydronephrosis. No perinephric or periureteral stranding. Urinary bladder is unremarkable. GI AND BOWEL: Stomach and duodenal sweep demonstrate no acute abnormality. There is no bowel obstruction. No abnormal bowel wall thickening or distension. REPRODUCTIVE: Status post hysterectomy. No adnexal masses are seen. PERITONEUM AND RETRPERITONEUM: No ascites or free air. LUNG BASE: Visualized lung bases are clear. LYMPH NODES: No lymphadenopathy. BONES AND SOFT TISSUES: Osseous structures are age appropriate. No acute bone abnormality. No lytic or blastic bone lesion. HEART AND MEDIASTINUM: Moderate right  coronary artery calcification. cardiac size within normal limits. IMPRESSION: 1. Stenting of the celiac axis origin with residual 50% stenosis, vessel remains patent. 2. 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 Clinical correlation for signs and symptoms of chronic mesenteric ischemia may be helpful. Electronically signed by: Dorethia Molt MD 07/06/2024 03:51 AM EDT RP Workstation: [id number]   DG Shoulder Left Result Date: 07/03/2024 EXAM: 1 VIEW XRAY OF THE LEFT SHOULDER 07/03/2024 02:40:00 PM COMPARISON: None available. CLINICAL HISTORY: fall, pain. FINDINGS: BONES AND JOINTS: Glenohumeral joint is normally aligned. No acute fracture or dislocation. Degenerative change of the acromioclavicular joint. SOFT TISSUES: No abnormal calcifications. Visualized lung is unremarkable. IMPRESSION: 1. No acute fracture or dislocation. 2. No acute osseous abnormality of the shoulder. Electronically signed by: Norleen Boxer MD 07/03/2024 03:19 PM EDT RP Workstation: [id number]   CT CHEST ABDOMEN PELVIS WO CONTRAST Result Date: 07/03/2024 CLINICAL DATA:  Abdominal and chest pain. EXAM: CT CHEST, ABDOMEN AND PELVIS WITHOUT CONTRAST TECHNIQUE: Multidetector CT imaging of the chest, abdomen and pelvis was performed following the standard protocol without IV contrast. RADIATION DOSE REDUCTION: This exam was performed according to the departmental dose-optimization program which includes automated exposure control, adjustment of the mA and/or kV according to patient size and/or use of iterative reconstruction technique. COMPARISON:  CT abdomen/pelvis dated 11/25/2023. CT chest dated 09/29/2023. FINDINGS: CT CHEST FINDINGS Cardiovascular: Normal heart size. No pericardial effusion. Multivessel coronary artery calcifications. Thoracic aorta is normal in caliber with atherosclerotic calcification. Mediastinum/Nodes: No enlarged mediastinal or axillary lymph nodes. Thyroid  gland, trachea, and esophagus demonstrate no significant findings. Lungs/Pleura: Redemonstrated 10 x 9 mm left upper lobe peribronchial nodule (series 4, image 37), essentially unchanged compared to the prior exam. Unchanged 13 mm right upper lobe nodule (series 4, image 66). No new suspicious pulmonary nodule. No focal consolidation, pleural effusion, or pneumothorax. Musculoskeletal: No chest wall mass or suspicious bone lesions identified. CT ABDOMEN PELVIS FINDINGS Hepatobiliary: No suspicious focal hepatic lesion identified within the limits of an unenhanced exam. Status post cholecystectomy. No biliary dilatation. Pancreas: Unremarkable. No pancreatic ductal dilatation or surrounding inflammatory changes. Spleen: Normal in size without focal abnormality. Adrenals/Urinary Tract: Stable 1.6 cm benign left adrenal adenoma, for which no follow-up imaging is recommended. The right adrenal gland is unremarkable. No renal or ureteral calculi. No hydronephrosis. Bladder is unremarkable. Stomach/Bowel: Stomach, small bowel, and colon are grossly unremarkable. No obstruction or inflammatory changes. Vascular/Lymphatic: Aortic atherosclerosis. Nonaneurysmal abdominal aorta. Evaluation of vascular structures is limited due to lack of intravenous contrast. No enlarged abdominal or pelvic lymph nodes. Reproductive: Status post hysterectomy. No adnexal masses. Other: No abdominopelvic ascites. No intraperitoneal free air. No abdominal wall hernia. Musculoskeletal: No acute osseous abnormality. No suspicious osseous lesion. Multilevel degenerative changes of the thoracolumbar spine. IMPRESSION: 1. No acute localizing findings in the chest, abdomen, or pelvis. 2. Unchanged 10 mm left upper lobe and 13 mm right upper lobe lung nodules, which remain suspicious for indolent bronchogenic carcinoma. Clinical correlation is recommended. 3. Coronary and Aortic Atherosclerosis (ICD10-I70.0). Electronically Signed   By: Harrietta Sherry M.D.   On: 07/03/2024 14:05   DG Chest 2 View Result Date: 07/03/2024 CLINICAL DATA:  Chest pain. EXAM: CHEST - 2 VIEW COMPARISON:  10/03/2023 FINDINGS: The cardiomediastinal contours are normal. Atherosclerosis of the aortic arch. Pulmonary vasculature is normal. No consolidation, pleural effusion, or pneumothorax. No acute osseous abnormalities are seen. IMPRESSION: No active cardiopulmonary disease. Electronically Signed   By: Andrea Gasman M.D.   On: 07/03/2024 12:32     Assessment/Plan There are no diagnoses linked to this encounter.   Cordella Shawl, MD  07/30/2024 12:19 PM

## 2024-08-01 ENCOUNTER — Encounter (INDEPENDENT_AMBULATORY_CARE_PROVIDER_SITE_OTHER): Payer: Self-pay | Admitting: Vascular Surgery

## 2024-08-08 DIAGNOSIS — I2510 Atherosclerotic heart disease of native coronary artery without angina pectoris: Secondary | ICD-10-CM | POA: Diagnosis not present

## 2024-08-08 DIAGNOSIS — R079 Chest pain, unspecified: Secondary | ICD-10-CM | POA: Diagnosis not present

## 2024-08-14 ENCOUNTER — Telehealth: Payer: Self-pay

## 2024-08-14 NOTE — Telephone Encounter (Signed)
 Pt would like the nurse to call her back she still has questions about monitor results

## 2024-08-15 NOTE — Telephone Encounter (Signed)
 Returned pt's call for request for more information regarding heart monitor; identified patient using two identifiers; pt states that I called too early with the results the first time and she couldn't remember if she dreamed getting information and she couldn't remember what was said;  I apologized and was happy to review her monitor results from Dr. Argentina;  Monitor shows premature beats coming from the bottom chambers of the heart. If she is still symptomatic from this, we can have her see EP since her heart rates are a bit too slow for us  to increase her metoprolol  any further. 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  All questions were answered and pt verbalized understanding and thanked me for my call.

## 2024-08-27 ENCOUNTER — Ambulatory Visit

## 2024-08-27 DIAGNOSIS — I2510 Atherosclerotic heart disease of native coronary artery without angina pectoris: Secondary | ICD-10-CM | POA: Diagnosis not present

## 2024-08-27 DIAGNOSIS — R079 Chest pain, unspecified: Secondary | ICD-10-CM | POA: Diagnosis not present

## 2024-08-27 LAB — ECHOCARDIOGRAM COMPLETE
AV Mean grad: 4 mmHg
AV Peak grad: 7.8 mmHg
Ao pk vel: 1.4 m/s
Area-P 1/2: 3.27 cm2

## 2024-08-28 NOTE — Telephone Encounter (Signed)
 Called and spoke with pt regarding results of Echocardiogram.  Dr. Argentina reviewed your recent Echocardiogram and noted the following:  Echocardiogram is essentially normal, no obvious cardiac cause of symptoms. We can continue with current therapies for now.   We also discussed if she was continuing to have symptoms from PVCs and pt states that her symptoms have improved.  We went over the results of [facility] Heart Monitor and PET Scan.  Pt verbalized understanding and states that she will continue with her current medications and if she develops worsening symptoms she would discuss the referral to EP.  Pt thankful for the care she received and feels blessed to have had normal cardiac testing.

## 2024-10-02 ENCOUNTER — Encounter: Payer: Self-pay | Admitting: Family Medicine

## 2024-10-06 ENCOUNTER — Observation Stay: Admission: EM | Admit: 2024-10-06 | Discharge: 2024-10-07 | Disposition: A

## 2024-10-06 ENCOUNTER — Other Ambulatory Visit: Payer: Self-pay

## 2024-10-06 ENCOUNTER — Emergency Department

## 2024-10-06 DIAGNOSIS — D509 Iron deficiency anemia, unspecified: Secondary | ICD-10-CM | POA: Diagnosis not present

## 2024-10-06 DIAGNOSIS — Z79899 Other long term (current) drug therapy: Secondary | ICD-10-CM | POA: Insufficient documentation

## 2024-10-06 DIAGNOSIS — Z9889 Other specified postprocedural states: Secondary | ICD-10-CM | POA: Diagnosis not present

## 2024-10-06 DIAGNOSIS — I110 Hypertensive heart disease with heart failure: Secondary | ICD-10-CM | POA: Diagnosis not present

## 2024-10-06 DIAGNOSIS — Z7902 Long term (current) use of antithrombotics/antiplatelets: Secondary | ICD-10-CM | POA: Insufficient documentation

## 2024-10-06 DIAGNOSIS — E785 Hyperlipidemia, unspecified: Secondary | ICD-10-CM | POA: Diagnosis present

## 2024-10-06 DIAGNOSIS — R109 Unspecified abdominal pain: Secondary | ICD-10-CM | POA: Diagnosis present

## 2024-10-06 DIAGNOSIS — E66811 Obesity, class 1: Secondary | ICD-10-CM | POA: Insufficient documentation

## 2024-10-06 DIAGNOSIS — G4733 Obstructive sleep apnea (adult) (pediatric): Secondary | ICD-10-CM | POA: Diagnosis not present

## 2024-10-06 DIAGNOSIS — Z6833 Body mass index (BMI) 33.0-33.9, adult: Secondary | ICD-10-CM | POA: Insufficient documentation

## 2024-10-06 DIAGNOSIS — I10 Essential (primary) hypertension: Secondary | ICD-10-CM | POA: Diagnosis present

## 2024-10-06 DIAGNOSIS — F32A Depression, unspecified: Secondary | ICD-10-CM | POA: Diagnosis present

## 2024-10-06 DIAGNOSIS — I5032 Chronic diastolic (congestive) heart failure: Secondary | ICD-10-CM | POA: Diagnosis present

## 2024-10-06 DIAGNOSIS — Z7982 Long term (current) use of aspirin: Secondary | ICD-10-CM

## 2024-10-06 DIAGNOSIS — E669 Obesity, unspecified: Secondary | ICD-10-CM | POA: Diagnosis not present

## 2024-10-06 DIAGNOSIS — Z87891 Personal history of nicotine dependence: Secondary | ICD-10-CM | POA: Diagnosis not present

## 2024-10-06 DIAGNOSIS — I708 Atherosclerosis of other arteries: Secondary | ICD-10-CM | POA: Diagnosis not present

## 2024-10-06 DIAGNOSIS — Z95828 Presence of other vascular implants and grafts: Secondary | ICD-10-CM | POA: Diagnosis not present

## 2024-10-06 DIAGNOSIS — I774 Celiac artery compression syndrome: Secondary | ICD-10-CM | POA: Diagnosis not present

## 2024-10-06 DIAGNOSIS — R1013 Epigastric pain: Secondary | ICD-10-CM | POA: Diagnosis not present

## 2024-10-06 LAB — HEPATIC FUNCTION PANEL
ALT: 36 U/L (ref 0–44)
AST: 81 U/L — ABNORMAL HIGH (ref 15–41)
Albumin: 4.2 g/dL (ref 3.5–5.0)
Alkaline Phosphatase: 70 U/L (ref 38–126)
Bilirubin, Direct: 0.2 mg/dL (ref 0.0–0.2)
Indirect Bilirubin: 0.3 mg/dL (ref 0.3–0.9)
Total Bilirubin: 0.4 mg/dL (ref 0.0–1.2)
Total Protein: 6.8 g/dL (ref 6.5–8.1)

## 2024-10-06 LAB — CBC
HCT: 33 % — ABNORMAL LOW (ref 36.0–46.0)
Hemoglobin: 10.3 g/dL — ABNORMAL LOW (ref 12.0–15.0)
MCH: 24.7 pg — ABNORMAL LOW (ref 26.0–34.0)
MCHC: 31.2 g/dL (ref 30.0–36.0)
MCV: 79.1 fL — ABNORMAL LOW (ref 80.0–100.0)
Platelets: 325 K/uL (ref 150–400)
RBC: 4.17 MIL/uL (ref 3.87–5.11)
RDW: 15 % (ref 11.5–15.5)
WBC: 6.9 K/uL (ref 4.0–10.5)
nRBC: 0 % (ref 0.0–0.2)

## 2024-10-06 LAB — BASIC METABOLIC PANEL WITH GFR
Anion gap: 12 (ref 5–15)
BUN: 10 mg/dL (ref 8–23)
CO2: 23 mmol/L (ref 22–32)
Calcium: 9.3 mg/dL (ref 8.9–10.3)
Chloride: 105 mmol/L (ref 98–111)
Creatinine, Ser: 0.85 mg/dL (ref 0.44–1.00)
GFR, Estimated: >60 mL/min
Glucose, Bld: 94 mg/dL (ref 70–99)
Potassium: 3.7 mmol/L (ref 3.5–5.1)
Sodium: 139 mmol/L (ref 135–145)

## 2024-10-06 LAB — PROTIME-INR
INR: 0.9 (ref 0.8–1.2)
Prothrombin Time: 12.7 s (ref 11.4–15.2)

## 2024-10-06 LAB — PRO BRAIN NATRIURETIC PEPTIDE: Pro Brain Natriuretic Peptide: 162 pg/mL

## 2024-10-06 LAB — APTT: aPTT: <22 s — ABNORMAL LOW (ref 24–36)

## 2024-10-06 LAB — LIPASE, BLOOD: Lipase: 52 U/L — ABNORMAL HIGH (ref 11–51)

## 2024-10-06 LAB — LACTIC ACID, PLASMA: Lactic Acid, Venous: 1.1 mmol/L (ref 0.5–1.9)

## 2024-10-06 LAB — TROPONIN T, HIGH SENSITIVITY: Troponin T High Sensitivity: <15 ng/L (ref 0–19)

## 2024-10-06 MED ORDER — HEPARIN (PORCINE) 25000 UT/250ML-% IV SOLN
1200.0000 [IU]/h | INTRAVENOUS | Status: DC
  Administered 2024-10-06: 1200 [IU]/h via INTRAVENOUS
  Filled 2024-10-06: qty 250

## 2024-10-06 MED ORDER — ATORVASTATIN CALCIUM 10 MG PO TABS: 10.0000 mg | ORAL_TABLET | Freq: Every day | ORAL | Status: DC

## 2024-10-06 MED ORDER — PAROXETINE HCL 10 MG PO TABS
10.0000 mg | ORAL_TABLET | Freq: Every day | ORAL | Status: DC
  Administered 2024-10-07: 10 mg via ORAL
  Filled 2024-10-06: qty 1

## 2024-10-06 MED ORDER — LISINOPRIL 10 MG PO TABS
10.0000 mg | ORAL_TABLET | Freq: Every day | ORAL | Status: DC
  Administered 2024-10-07: 10 mg via ORAL
  Filled 2024-10-06: qty 1

## 2024-10-06 MED ORDER — OXYCODONE-ACETAMINOPHEN 5-325 MG PO TABS: 1.0000 | ORAL_TABLET | ORAL | Status: DC | PRN

## 2024-10-06 MED ORDER — HYDRALAZINE HCL 50 MG PO TABS
50.0000 mg | ORAL_TABLET | Freq: Two times a day (BID) | ORAL | Status: DC
  Administered 2024-10-06 – 2024-10-07 (×2): 50 mg via ORAL
  Filled 2024-10-06 (×2): qty 1

## 2024-10-06 MED ORDER — METOPROLOL SUCCINATE ER 25 MG PO TB24
25.0000 mg | ORAL_TABLET | Freq: Every day | ORAL | Status: DC
  Administered 2024-10-07: 25 mg via ORAL
  Filled 2024-10-06: qty 1

## 2024-10-06 MED ORDER — ACETAMINOPHEN 325 MG PO TABS: 650.0000 mg | ORAL_TABLET | Freq: Four times a day (QID) | ORAL | Status: DC | PRN

## 2024-10-06 MED ORDER — ONDANSETRON HCL 4 MG/2ML IJ SOLN
4.0000 mg | Freq: Once | INTRAMUSCULAR | Status: AC
  Administered 2024-10-06: 4 mg via INTRAVENOUS
  Filled 2024-10-06: qty 2

## 2024-10-06 MED ORDER — HYDROCHLOROTHIAZIDE 12.5 MG PO TABS
12.5000 mg | ORAL_TABLET | Freq: Every day | ORAL | Status: DC
  Administered 2024-10-07: 12.5 mg via ORAL
  Filled 2024-10-06: qty 1

## 2024-10-06 MED ORDER — FENTANYL CITRATE (PF) 50 MCG/ML IJ SOSY
25.0000 ug | PREFILLED_SYRINGE | INTRAMUSCULAR | Status: DC | PRN
  Administered 2024-10-06: 25 ug via INTRAVENOUS
  Filled 2024-10-06: qty 1

## 2024-10-06 MED ORDER — CLOPIDOGREL BISULFATE 75 MG PO TABS
75.0000 mg | ORAL_TABLET | Freq: Every day | ORAL | Status: DC
  Administered 2024-10-07: 75 mg via ORAL
  Filled 2024-10-06: qty 1

## 2024-10-06 MED ORDER — FAMOTIDINE 20 MG PO TABS
40.0000 mg | ORAL_TABLET | Freq: Every day | ORAL | Status: DC
  Administered 2024-10-06: 40 mg via ORAL
  Filled 2024-10-06: qty 2

## 2024-10-06 MED ORDER — HYDRALAZINE HCL 20 MG/ML IJ SOLN: 5.0000 mg | INTRAMUSCULAR | Status: DC | PRN

## 2024-10-06 MED ORDER — ASPIRIN 81 MG PO CHEW
81.0000 mg | CHEWABLE_TABLET | Freq: Every day | ORAL | Status: DC
  Administered 2024-10-07: 81 mg via ORAL
  Filled 2024-10-06: qty 1

## 2024-10-06 MED ORDER — SODIUM CHLORIDE 0.9 % IV SOLN: INTRAVENOUS | Status: DC

## 2024-10-06 MED ORDER — LISINOPRIL-HYDROCHLOROTHIAZIDE 10-12.5 MG PO TABS: 1.0000 | ORAL_TABLET | Freq: Every day | ORAL | Status: DC

## 2024-10-06 MED ORDER — FERROUS SULFATE 325 (65 FE) MG PO TABS
325.0000 mg | ORAL_TABLET | Freq: Every day | ORAL | Status: DC
  Administered 2024-10-07: 325 mg via ORAL
  Filled 2024-10-06: qty 1

## 2024-10-06 MED ORDER — SODIUM CHLORIDE 0.9 % IV SOLN: Freq: Once | INTRAVENOUS | Status: DC

## 2024-10-06 MED ORDER — ONDANSETRON HCL 4 MG/2ML IJ SOLN: 4.0000 mg | Freq: Three times a day (TID) | INTRAMUSCULAR | Status: DC | PRN

## 2024-10-06 MED ORDER — MORPHINE SULFATE (PF) 4 MG/ML IV SOLN
4.0000 mg | Freq: Once | INTRAVENOUS | Status: AC
  Administered 2024-10-06: 4 mg via INTRAVENOUS
  Filled 2024-10-06: qty 1

## 2024-10-06 MED ORDER — FENTANYL CITRATE (PF) 50 MCG/ML IJ SOSY: 25.0000 ug | PREFILLED_SYRINGE | INTRAMUSCULAR | Status: DC | PRN

## 2024-10-06 MED ORDER — HYDROCORTISONE SOD SUC (PF) 100 MG IJ SOLR
200.0000 mg | Freq: Once | INTRAMUSCULAR | Status: DC
  Filled 2024-10-06: qty 4

## 2024-10-06 NOTE — ED Triage Notes (Signed)
 Pt comes in via pov with complaint of epigastric pain for the past month or so, but has gotten worse. Pt states that she had a stent placed back in October, and has continued to have intermittent pain. Pt complains of pain 9/10 at this time. Pt is alert and oriented x4.

## 2024-10-06 NOTE — ED Notes (Addendum)
 MD [place], requested to hold on given medication until she consults Vascular

## 2024-10-06 NOTE — Consult Note (Signed)
 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   Medical History: Past Medical History:  Diagnosis Date   Adenomatous colon polyp    Adrenal adenoma    Allergic genetic state    Arthritis    Chronic abdominal pain    GERD (gastroesophageal reflux disease)    Hiatal hernia    Hypertension    Sleep apnea     Medications:  (Not in a hospital admission)  Scheduled:   hydrocortisone  sodium succinate   200 mg Intravenous Once   Infusions:   sodium chloride      heparin      PRN:  Anti-infectives (From admission, onward)    None       Assessment: Pt is a 58 YOF who presents to ED with epigastric pain that has continued to worsen over the past month. Pt had a stent placed in October and has had intermittent pain since. Prior to admission pt was not taking any anticoagulants but was on antiplatelet therapy taking clopidogrel  75 mg daily.  Current H& H are trending down with hemoglobin 10.3 and HCT 33, platelets are WNL. Pharmacy has been consulted for the management of heparin  in the setting of celiac axis stenosis for this pt. No DOAC PTA.   Goal of Therapy:  Heparin  level 0.3-0.7 units/ml Monitor platelets by anticoagulation protocol: Yes   Plan:  No initial bolus.  Start heparin  infusion at 1200 units/hr Check anti-Xa level in 8 hours and daily while on heparin  Continue to monitor H&H and  platelets  Veleria FORBES Ahle- PharmD Candidate  10/06/2024,6:09 PM      [1]  Allergies Allergen Reactions   Iodinated Contrast Media Anaphylaxis    Other reaction(s): Vomiting   Ciprofloxacin Diarrhea and Nausea Only    Nausea, vomiting  Other reaction(s): Vomiting   Codeine Sulfate [Codeine] Diarrhea    Nausea, vomiting   Meloxicam    Penicillins Nausea Only   Protonix  [Pantoprazole  Sodium] Hives   Shellfish Allergy Swelling   Sulfa Antibiotics Rash

## 2024-10-06 NOTE — ED Notes (Signed)
 Pt's daughter Shanda called to check on pt informed her that pt is currently undergoing US  and will pass the message to pt to call her back

## 2024-10-06 NOTE — ED Notes (Signed)
 US  at bedside.

## 2024-10-06 NOTE — Progress Notes (Signed)
 Requesting modification for order for pain medication/management. Fentanyl  order modified to reflect severe pain 7-10 by Dr. Hilma.    10/06/24 1944  Provider Notification  Provider Name/Title Mansy + Hilma  Date Provider Notified 10/06/24  Time Provider Notified 1958  Method of Notification Page  Notification Reason Other (Comment)  Provider response See new orders  Date of Provider Response 10/06/24  Time of Provider Response 2001

## 2024-10-06 NOTE — ED Provider Notes (Signed)
 "  [facility] Provider Note    Event Date/Time   First MD Initiated Contact with Patient 10/06/24 1404     (approximate)   History   Abdominal Pain   HPI  Teresa Sparks is a 74 y.o. 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  She states that the pain is a 9 out of 10.  She reports that she had a stent placed in October 2025 but she does not know exactly what the stent was for.  She denies any nausea, vomiting, or diarrhea.     Physical Exam   Triage Vital Signs: ED Triage Vitals  Encounter Vitals Group     BP 10/06/24 1156 (!) 186/84     Girls Systolic BP Percentile --      Girls Diastolic BP Percentile --      Boys Systolic BP Percentile --      Boys Diastolic BP Percentile --      Pulse Rate 10/06/24 1156 63     Resp 10/06/24 1156 17     Temp 10/06/24 1156 98.1 F (36.7 C)     Temp Source 10/06/24 1429 Oral     SpO2 10/06/24 1156 100 %     Weight 10/06/24 1154 225 lb (102.1 kg)     Height 10/06/24 1154 5' 9 (1.753 m)     Head Circumference --      Peak Flow --      Pain Score 10/06/24 1153 9     Pain Loc --      Pain Education --      Exclude from Growth Chart --     Most recent vital signs: Vitals:   10/06/24 1156 10/06/24 1429  BP: (!) 186/84 (!) 192/56  Pulse: 63 (!) 58  Resp: 17 20  Temp: 98.1 F (36.7 C) 97.9 F (36.6 C)  SpO2: 100% 99%     General: Awake, no distress.  CV:  Good peripheral perfusion.  Resp:  Normal effort.  Abd:  No distention.  Significant epigastric tenderness to palpation Other:     ED Results / Procedures / Treatments   Labs (all labs ordered are listed, but only abnormal results are displayed) Labs Reviewed  CBC - Abnormal; Notable for the following  components:      Result Value   Hemoglobin 10.3 (*)    HCT 33.0 (*)    MCV 79.1 (*)    MCH 24.7 (*)    All other components within normal limits  BASIC METABOLIC PANEL WITH GFR  PROTIME-INR  TROPONIN T, HIGH SENSITIVITY  TROPONIN T, HIGH SENSITIVITY     EKG  ED ECG REPORT I, Reche CHRISTELLA Leventhal, the attending physician, personally viewed and interpreted this ECG.  Date: 10/06/2024 Rate: 70 bpm Rhythm: normal sinus rhythm QRS Axis: normal Intervals: normal ST/T Wave abnormalities: normal Narrative Interpretation: no evidence of acute ischemia    RADIOLOGY Chest x-ray was independently reviewed and interpreted by myself as no acute pathology.  Abdomen/pelvis CT: IMPRESSION:  1. No findings to explain the patient's pain.  2. Bilateral adrenal adenomas.  3. Aortic atherosclerosis (ICD10-I70.0). Coronary artery  calcification.   Mesenteric artery ultrasound: 1. 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 If indicated, this could be better  assessed with CT arteriography in the expiratory phase of respiration  2. No abdominal aortic aneurysm.          PROCEDURES:  Critical Care performed: No  Procedures   MEDICATIONS ORDERED IN ED: Medications  hydrocortisone  sodium succinate  (SOLU-CORTEF ) 100 MG injection 200 mg (has no administration in time range)     IMPRESSION / MDM / ASSESSMENT AND PLAN / ED COURSE  I reviewed the triage vital signs and the nursing notes.                              Differential diagnosis includes, but is not limited to, biliary disease (biliary colic, acute cholecystitis, cholangitis, choledocholithiasis, etc), intrathoracic causes for epigastric abdominal pain including ACS, gastritis, duodenitis, pancreatitis, small bowel or large bowel obstruction, abdominal aortic aneurysm, hernia, and ulcer(s).    Patient's presentation is most consistent with acute presentation with potential threat to life or bodily function.  Patient is a 74 year old female with past medical history of celiac artery stenosis presenting to the emergency department complaining of worsening epigastric pain.  The patient is anaphylactic to contrast dye and so CTA cannot be performed at this time.  Discussed the patient's case with Dr. Tisa, vascular surgery, who has reviewed the patient's case and recommends a noncontrast CT of the abdomen/pelvis as well has mesenteric arteries ultrasound.  CT of the abdomen pelvis does not show any acute pathology.  Mesenteric artery ultrasound shows a greater than 70% stenosis of the celiac artery.  Results were discussed with Dr. Tisa.  He came to evaluate patient here in the emergency department.  At this time he recommends admission to the hospitalist and starting the patient on heparin .  Clear fluids only.  They will evaluate tomorrow to determine intervention plan.      FINAL CLINICAL IMPRESSION(S) / ED DIAGNOSES   Final diagnoses:  Celiac artery stenosis     Rx / DC Orders   ED Discharge Orders     None        Note:  This document was prepared using Dragon voice recognition software and may include unintentional dictation errors.   Rexford Reche HERO, MD 10/06/24 [phone]  "

## 2024-10-06 NOTE — ED Notes (Signed)
 Pt to CT

## 2024-10-06 NOTE — Consult Note (Signed)
 " [facility] Vascular Consult Note  MRN : [id number]  Teresa Sparks is a 74 y.o. (Apr 11, 1951) female who presents with chief complaint of  Chief Complaint  Patient presents with   Abdominal Pain  .  History of Present Illness: Patient with history of symptomatic Celiac artery stenosis. S/P angioplasty stenting of Celiac Artery on 07/06/2024. Mesenteric duplex 07/25/2024 revealed patent Celiac stent with 70% stenosis. Upon initial follow up 07/30/2024- patient reported she was asymptomatic. The patient has been compliant with ASA/Plavix . Presents today with progressively worsening epigastric pain since yesterday. The patient now states the pain never went away completely postoperatively. Denies persistent nausea- only occasionally with PO intake of heavy meals. She states she only eats small meals now. Denies weight loss, emesis or diarrhea. Notes occasional right flank soreness.Denies chest pain, palpitations or other symptomology.   Current Facility-Administered Medications  Medication Dose Route Frequency Provider Last Rate Last Admin   0.9 %  sodium chloride  infusion   Intravenous Once Mulvihill, Caitlin M, MD       hydrocortisone  sodium succinate  (SOLU-CORTEF ) 100 MG injection 200 mg  200 mg Intravenous Once Mulvihill, Caitlin M, MD       Current Outpatient Medications  Medication Sig Dispense Refill   acetaminophen  (TYLENOL ) 650 MG CR tablet Take 650 mg by mouth every 8 (eight) hours as needed for pain.     aspirin  81 MG chewable tablet Chew by mouth daily.     atorvastatin  (LIPITOR) 10 MG tablet Take 10 mg by mouth daily with supper.     cetirizine (ZYRTEC) 10 MG tablet Take 10 mg by mouth daily.     clopidogrel  (PLAVIX ) 75 MG tablet Take 1 tablet (75 mg total) by mouth daily. 90 tablet 3   colchicine  0.6 MG tablet Take 0.6 mg by mouth as needed.     esomeprazole  (NEXIUM ) 40 MG capsule Take 40 mg by mouth 2 (two) times daily. (Patient not taking: Reported  on 07/30/2024)     famotidine  (PEPCID ) 40 MG tablet Take 40 mg by mouth at bedtime.     ferrous sulfate  325 (65 FE) MG tablet Take 325 mg by mouth daily with breakfast.     fluticasone  (FLONASE ) 50 MCG/ACT nasal spray Place 1 spray into both nostrils daily.     hydrALAZINE  (APRESOLINE ) 50 MG tablet Take 50 mg by mouth 2 (two) times daily.     lisinopril -hydrochlorothiazide  (ZESTORETIC ) 10-12.5 MG tablet Take 1 tablet by mouth daily.     metoprolol  succinate (TOPROL -XL) 25 MG 24 hr tablet Take 25 mg by mouth daily.     Multiple Vitamins-Minerals (CENTRUM ADULTS PO) Take by mouth daily.     oxyCODONE -acetaminophen  (PERCOCET/ROXICET) 5-325 MG tablet Take 1 tablet by mouth every 4 (four) hours as needed for severe pain (pain score 7-10). 10 tablet 0   PARoxetine  (PAXIL ) 10 MG tablet Take 1 tablet by mouth daily at 6 (six) AM.     sucralfate  (CARAFATE ) 1 g tablet Take 1 tablet (1 g total) by mouth 4 (four) times daily as needed.     Facility-Administered Medications Ordered in Other Encounters  Medication Dose Route Frequency Provider Last Rate Last Admin   Heparin  (Porcine) in NaCl 2000-0.9 UNIT/L-% SOLN    PRN Schnier, Gregory G, MD   1,000 mL at 11/25/23 1718   lidocaine -EPINEPHrine  (PF) (XYLOCAINE -EPINEPHrine ) 1 %-1:200000 (PF) injection    PRN Schnier, Cordella MATSU, MD   10 mL at 11/25/23 1718    Past Medical History:  Diagnosis Date   Adenomatous colon polyp    Adrenal adenoma    Allergic genetic state    Arthritis    Chronic abdominal pain    GERD (gastroesophageal reflux disease)    Hiatal hernia    Hypertension    Sleep apnea     Past Surgical History:  Procedure Laterality Date   ABDOMINAL HYSTERECTOMY     CHOLECYSTECTOMY     COLONOSCOPY     COLONOSCOPY WITH PROPOFOL  N/A 07/24/2018   Procedure: COLONOSCOPY WITH PROPOFOL ;  Surgeon: Viktoria Lamar DASEN, MD;  Location: [facility] ENDOSCOPY;  Service: Endoscopy;  Laterality: N/A;   COLONOSCOPY WITH PROPOFOL  N/A 11/23/2023   Procedure:  COLONOSCOPY WITH PROPOFOL ;  Surgeon: Toledo, Ladell POUR, MD;  Location: [facility] ENDOSCOPY;  Service: Gastroenterology;  Laterality: N/A;   ENDOBRONCHIAL ULTRASOUND Bilateral 09/30/2023   Procedure: ENDOBRONCHIAL ULTRASOUND;  Surgeon: Tamea Dedra CROME, MD;  Location: [facility] ORS;  Service: Pulmonary;  Laterality: Bilateral;   ESOPHAGOGASTRODUODENOSCOPY (EGD) WITH PROPOFOL  N/A 11/23/2023   Procedure: ESOPHAGOGASTRODUODENOSCOPY (EGD) WITH PROPOFOL ;  Surgeon: Toledo, Ladell POUR, MD;  Location: [facility] ENDOSCOPY;  Service: Gastroenterology;  Laterality: N/A;   FRACTURE SURGERY     HEMOSTASIS CLIP PLACEMENT  11/23/2023   Procedure: HEMOSTASIS CLIP PLACEMENT;  Surgeon: Aundria, Teodoro K, MD;  Location: [facility] ENDOSCOPY;  Service: Gastroenterology;;   HOT HEMOSTASIS  11/23/2023   Procedure: HOT HEMOSTASIS (ARGON PLASMA COAGULATION/BICAP);  Surgeon: Aundria, Ladell POUR, MD;  Location: [facility] ENDOSCOPY;  Service: Gastroenterology;;   JOINT REPLACEMENT Right    6 years ago   POLYPECTOMY  11/23/2023   Procedure: POLYPECTOMY;  Surgeon: Aundria, Ladell POUR, MD;  Location: [facility] ENDOSCOPY;  Service: Gastroenterology;;   VISCERAL ANGIOGRAPHY N/A 02/05/2022   Procedure: VISCERAL ANGIOGRAPHY;  Surgeon: Jama Cordella MATSU, MD;  Location: [facility] INVASIVE CV LAB;  Service: Cardiovascular;  Laterality: N/A;   VISCERAL ANGIOGRAPHY N/A 10/04/2023   Procedure: VISCERAL ANGIOGRAPHY;  Surgeon: Jama Cordella MATSU, MD;  Location: [facility] INVASIVE CV LAB;  Service: Cardiovascular;  Laterality: N/A;   VISCERAL ANGIOGRAPHY N/A 11/25/2023   Procedure: VISCERAL ANGIOGRAPHY;  Surgeon: Jama Cordella MATSU, MD;  Location: [facility] INVASIVE CV LAB;  Service: Cardiovascular;  Laterality: N/A;   VISCERAL ANGIOGRAPHY N/A 07/06/2024   Procedure: VISCERAL ANGIOGRAPHY;  Surgeon: Jama Cordella MATSU, MD;  Location: [facility] INVASIVE CV LAB;  Service: Cardiovascular;  Laterality: N/A;    Social History Social History[1]  Family History Family History  Problem Relation Age  of Onset   Hypertension Mother    Stroke Mother    Epilepsy Mother    Colon polyps Sister    Breast cancer Other     Allergies[2]   REVIEW OF SYSTEMS (Negative unless checked)  Constitutional: [] Weight loss  [] Fever  [] Chills Cardiac: [] Chest pain   [] Chest pressure   [] Palpitations   [] Shortness of breath when laying flat   [] Shortness of breath at rest   [] Shortness of breath with exertion. Vascular:  [x] Pain in legs with walking   [] Pain in legs at rest   [] Pain in legs when laying flat   [] Claudication   [] Pain in feet when walking  [] Pain in feet at rest  [] Pain in feet when laying flat   [] History of DVT   [] Phlebitis   [] Swelling in legs   [] Varicose veins   [] Non-healing ulcers Pulmonary:   [] Uses home oxygen   [] Productive cough   [] Hemoptysis   [] Wheeze  [] COPD   [] Asthma Neurologic:  [] Dizziness  [] Blackouts   [] Seizures   [] History of stroke   []   History of TIA  [] Aphasia   [] Temporary blindness   [] Dysphagia   [] Weakness or numbness in arms   [] Weakness or numbness in legs Musculoskeletal:  [] Arthritis   [] Joint swelling   [] Joint pain   [] Low back pain Hematologic:  [] Easy bruising  [] Easy bleeding   [] Hypercoagulable state   [] Anemic  [] Hepatitis Gastrointestinal:  [] Blood in stool   [] Vomiting blood  [] Gastroesophageal reflux/heartburn   [] Difficulty swallowing. Genitourinary:  [] Chronic kidney disease   [] Difficult urination  [] Frequent urination  [] Burning with urination   [] Blood in urine Skin:  [] Rashes   [] Ulcers   [] Wounds Psychological:  [] History of anxiety   []  History of major depression.  Physical Examination  Vitals:   10/06/24 1154 10/06/24 1156 10/06/24 1429 10/06/24 1723  BP:  (!) 186/84 (!) 192/56 (!) 169/155  Pulse:  63 (!) 58 84  Resp:  17 20 (!) 21  Temp:  98.1 F (36.7 C) 97.9 F (36.6 C) 98.4 F (36.9 C)  TempSrc:   Oral Oral  SpO2:  100% 99% 100%  Weight: 102.1 kg     Height: 5' 9 (1.753 m)      Body mass index is 33.23 kg/m. Gen:   WD/WN, NAD Head: [place]/AT, No temporalis wasting. Prominent temp pulse not noted. Eyes: Sclera non-icteric, conjunctiva clear Neck: Trachea midline.  No JVD.  Pulmonary:  Good air movement, respirations not labored, equal bilaterally.  Cardiac: RRR, normal S1, S2. Vascular:  Vessel Right Left  Radial Palpable Palpable                                   Gastrointestinal: soft, mild epigastric and RUQ tenderness to palpation, non-distended. No rebound, guarding/reflex.  Musculoskeletal: M/S 5/5 throughout.  Extremities without ischemic changes.  No deformity or atrophy. No edema. Neurologic: Sensation grossly intact in extremities.  Symmetrical.  Speech is fluent. Motor exam as listed above. Psychiatric: Judgment intact, Mood & affect appropriate for pt's clinical situation. Dermatologic: No rashes or ulcers noted.  No cellulitis or open wounds. Lymph : No Cervical, Axillary, or Inguinal lymphadenopathy.     CBC Lab Results  Component Value Date   WBC 6.9 10/06/2024   HGB 10.3 (L) 10/06/2024   HCT 33.0 (L) 10/06/2024   MCV 79.1 (L) 10/06/2024   PLT 325 10/06/2024    BMET    Component Value Date/Time   NA 139 10/06/2024 1155   NA 138 05/08/2014 0151   K 3.7 10/06/2024 1155   K 3.8 05/08/2014 0151   CL 105 10/06/2024 1155   CL 106 05/08/2014 0151   CO2 23 10/06/2024 1155   CO2 23 05/08/2014 0151   GLUCOSE 94 10/06/2024 1155   GLUCOSE 97 05/08/2014 0151   BUN 10 10/06/2024 1155   BUN 16 05/08/2014 0151   CREATININE 0.85 10/06/2024 1155   CREATININE 1.25 05/08/2014 0151   CALCIUM  9.3 10/06/2024 1155   CALCIUM  8.8 05/08/2014 0151   GFRNONAA >60 10/06/2024 1155   GFRNONAA 46 (L) 05/08/2014 0151   GFRAA 56 (L) 04/23/2018 0901   GFRAA 54 (L) 05/08/2014 0151   Estimated Creatinine Clearance: 77.2 mL/min (by C-G formula based on SCr of 0.85 mg/dL).  COAG Lab Results  Component Value Date   INR 0.9 10/06/2024   INR 1.1 07/06/2024   INR 1.0 07/05/2024     Radiology US  MESENTERIC ARTERIES Result Date: 10/06/2024 EXAM: US  mesenteric doppler 10/06/2024 05:09:25 PM TECHNIQUE: Doppler  ultrasound of the mesenteric vessels was performed utilizing grayscale, color Doppler and spectral Doppler techniques. 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 There is no evidence of abdominal aortic aneurysm with a maximal diameter of 2.2 cm. Peak systolic velocities within the celiac axis proximally and distally with deep inspiration are 252 cm/sec and 192 cm/sec, respectively. Peak systolic velocities within the celiac axis proximally and distally on full expiration are 291 cm/sec and 255 cm/sec, respectively. There is a hemodynamically significant stenosis of the celiac axis greater than 70%. The absolute change in peak systolic velocity with inspiration and expiration does not meet criteria for definitive diagnosis of median arcuate ligament compression. The proximal superior mesenteric artery demonstrates a normal spectral Doppler waveform with a peak systolic velocity of 136 cm/sec. The inferior mesenteric artery was not visualized. OTHER MESENTERIC VESSELS: Splenic artery: 222 cm/sec Hepatic artery: 128 cm/sec IMPRESSION: 1. 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 If indicated, this could be better assessed with CT arteriography in the expiratory phase of respiration 2. No abdominal aortic aneurysm. Electronically signed by: Dorethia Molt MD MD 10/06/2024 05:34 PM EST RP Workstation: [id number]   CT ABDOMEN PELVIS WO CONTRAST Result Date: 10/06/2024 CLINICAL DATA:  Epigastric pain. EXAM: CT ABDOMEN AND PELVIS WITHOUT CONTRAST  TECHNIQUE: Multidetector CT imaging of the abdomen and pelvis was performed following the standard protocol without IV contrast. RADIATION DOSE REDUCTION: This exam was performed according to the departmental dose-optimization program which includes automated exposure control, adjustment of the mA and/or kV according to patient size and/or use of iterative reconstruction technique. COMPARISON:  07/06/2024. FINDINGS: Lower chest: Mild bibasilar subsegmental volume loss. Heart is at the upper limits of normal in size. Atherosclerotic calcification of the aorta and right coronary artery. No pericardial or pleural effusion. Distal esophagus is grossly unremarkable. Hepatobiliary: Liver is grossly unremarkable. Cholecystectomy. No biliary ductal dilatation. Pancreas: Negative. Spleen: Negative. Adrenals/Urinary Tract: Slight nodular thickening of the right adrenal gland and 2.1 cm left adrenal nodule (23 Hounsfield units), unchanged from 08/25/2021. No specific follow-up necessary. Right kidney is unremarkable. Small low-attenuation lesion in the medial left kidney. No specific follow-up necessary. Ureters are decompressed. Bladder is grossly unremarkable. Stomach/Bowel: Stomach, small bowel, appendix and colon are unremarkable. Vascular/Lymphatic: Atherosclerotic calcification of the aorta. No pathologically enlarged lymph nodes. Reproductive: Hysterectomy.  No adnexal mass. Other: Small bilateral inguinal hernias contain fat. Trace pelvic free fluid. Musculoskeletal: Degenerative changes in the spine. IMPRESSION: 1. No findings to explain the patient's pain. 2. Bilateral adrenal adenomas. 3. Aortic atherosclerosis (ICD10-I70.0). Coronary artery calcification. Electronically Signed   By: Newell Eke M.D.   On: 10/06/2024 15:52   DG Chest 2 View Result Date: 10/06/2024 CLINICAL DATA:  Epigastric pain. EXAM: CHEST - 2 VIEW COMPARISON:  July 03, 2024 FINDINGS: The heart size and mediastinal contours are within  normal limits. There is moderate to marked severity calcification of the aortic arch. Both lungs are clear. The visualized skeletal structures are unremarkable. IMPRESSION: No active cardiopulmonary disease. Electronically Signed   By: Suzen Dials M.D.   On: 10/06/2024 12:47      Assessment/Plan Celiac Artery stenosis Celiac stent remains patent- overall velocities not significantly changed from prior imaging 07/25/2024. However, patient has subjective worsening pain.   Recommend: Admit to Hospitalist  Serial Abdominal exams IVF Heparin  gtt Follow labs Clear liquid diet Ok  Will reassess in the morning- if improvement OK for discharge home for outpatient follow up-  If no improvement will require Prep for contrast and  possible inpatient mesenteric angio/intervention next week per Dr. Jama  The patient states she would prefer to go home and not be admitted. Her daughters at bedside encouraged patient to stay overnight for monitoring.   Tisa Curry LABOR, MD  10/06/2024 6:04 PM    This note was created with Dragon medical transcription system.  Any error is purely unintentional      [1]  Social History Tobacco Use   Smoking status: Former    Average packs/day: 0.3 packs/day for 2.0 years (0.5 ttl pk-yrs)    Types: Cigarettes    Start date: 28   Smokeless tobacco: Current    Types: Snuff  Vaping Use   Vaping status: Never Used  Substance Use Topics   Alcohol use: No   Drug use: Never  [2]  Allergies Allergen Reactions   Iodinated Contrast Media Anaphylaxis    Other reaction(s): Vomiting   Ciprofloxacin Diarrhea and Nausea Only    Nausea, vomiting  Other reaction(s): Vomiting   Codeine Sulfate [Codeine] Diarrhea    Nausea, vomiting   Meloxicam    Penicillins Nausea Only   Protonix  [Pantoprazole  Sodium] Hives   Shellfish Allergy Swelling   Sulfa Antibiotics Rash   "

## 2024-10-06 NOTE — H&P (Signed)
 " History and Physical    KYSA CALAIS FMW:[id number] DOB: 1951/03/10 DOA: 10/06/2024  Referring MD/NP/PA:   PCP: Valora Lynwood FALCON, MD   Patient coming from:  The patient is coming from home.     Chief Complaint: Abdominal pain  HPI: Teresa Sparks is a 74 y.o. female with medical history significant of celiac artery stenosis, s/p of angioplasty stenting of Celiac Artery on 07/06/2024, HTN, HLD, dCHF, gout, GERD, OSA on CPAP, iron deficiency anemia, hiatal hernia, adrenal adenoma, depression, who presents with abdominal pain.  Patient states that she has intermittent mild abdominal pain chronically, but it has worsened since yesterday. It is located in her upper abdomen, constant, aching, 9 out of 10 in severity, nonradiating, not aggravated or alleviated by any known factors.  She has nausea, no vomiting, diarrhea or abdominal pain.  No fever or chills.  No chest pain, cough, SOB.  No symptoms of UTI.  No rectal bleeding or dark stool.  No recent fall or head injury.  Data reviewed independently and ED Course: pt was found to have trop  < 15, lactic acid 1.1, WBC 6.9, GFR> 60, INR 0.9, lipase 52, liver function normal.  Temperature normal, blood pressure 102/56, 169/155, heart rate 50-80s, RR 21, oxygen saturation 99% on room air.  Patient is placed in telemetry bed for observation. Dr. Tisa of [facility] is consulted.  US -mesenteric artery: 1. 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 If indicated, this could be better assessed with CT arteriography in the expiratory phase of respiration 2. No abdominal aortic aneurysm.   CT-abdomen/pelvis: 1. No findings to explain the patient's pain. 2. Bilateral adrenal adenomas. 3. Aortic atherosclerosis (ICD10-I70.0). Coronary artery calcification.    EKG: I have personally reviewed.  Sinus rhythm, QTc 447,  nonspecific T wave change.   Review of Systems:   General: no fevers, chills, no body weight gain, has fatigue HEENT: no blurry vision, hearing changes or sore throat Respiratory: no dyspnea, coughing, wheezing CV: no chest pain, no palpitations GI: Has nausea, upper abdominal pain, no vomiting or diarrhea. GU: no dysuria, burning on urination, increased urinary frequency, hematuria  Ext: no leg edema Neuro: no unilateral weakness, numbness, or tingling, no vision change or hearing loss Skin: no rash, no skin tear. MSK: No muscle spasm, no deformity, no limitation of range of movement in spin Heme: No easy bruising.  Travel history: No recent long distant travel.   Allergy: Allergies[1]  Past Medical History:  Diagnosis Date   Adenomatous colon polyp    Adrenal adenoma    Allergic genetic state    Arthritis    Chronic abdominal pain    GERD (gastroesophageal reflux disease)    Hiatal hernia    Hypertension    Sleep apnea     Past Surgical History:  Procedure Laterality Date   ABDOMINAL HYSTERECTOMY     CHOLECYSTECTOMY     COLONOSCOPY     COLONOSCOPY WITH PROPOFOL  N/A 07/24/2018   Procedure: COLONOSCOPY WITH PROPOFOL ;  Surgeon: Viktoria Lamar DASEN, MD;  Location: [facility] ENDOSCOPY;  Service: Endoscopy;  Laterality: N/A;   COLONOSCOPY WITH PROPOFOL  N/A 11/23/2023   Procedure: COLONOSCOPY WITH PROPOFOL ;  Surgeon: Toledo, Ladell POUR, MD;  Location: [facility] ENDOSCOPY;  Service: Gastroenterology;  Laterality: N/A;   ENDOBRONCHIAL ULTRASOUND Bilateral 09/30/2023   Procedure: ENDOBRONCHIAL ULTRASOUND;  Surgeon: Tamea Dedra CROME, MD;  Location: [facility] ORS;  Service: Pulmonary;  Laterality: Bilateral;  ESOPHAGOGASTRODUODENOSCOPY (EGD) WITH PROPOFOL  N/A 11/23/2023   Procedure: ESOPHAGOGASTRODUODENOSCOPY (EGD) WITH PROPOFOL ;  Surgeon: Toledo, Ladell POUR, MD;  Location: [facility] ENDOSCOPY;  Service: Gastroenterology;  Laterality: N/A;   FRACTURE SURGERY     HEMOSTASIS CLIP PLACEMENT  11/23/2023    Procedure: HEMOSTASIS CLIP PLACEMENT;  Surgeon: Aundria, Teodoro K, MD;  Location: [facility] ENDOSCOPY;  Service: Gastroenterology;;   HOT HEMOSTASIS  11/23/2023   Procedure: HOT HEMOSTASIS (ARGON PLASMA COAGULATION/BICAP);  Surgeon: Aundria, Ladell POUR, MD;  Location: [facility] ENDOSCOPY;  Service: Gastroenterology;;   JOINT REPLACEMENT Right    6 years ago   POLYPECTOMY  11/23/2023   Procedure: POLYPECTOMY;  Surgeon: Aundria, Ladell POUR, MD;  Location: [facility] ENDOSCOPY;  Service: Gastroenterology;;   VISCERAL ANGIOGRAPHY N/A 02/05/2022   Procedure: VISCERAL ANGIOGRAPHY;  Surgeon: Jama Cordella MATSU, MD;  Location: [facility] INVASIVE CV LAB;  Service: Cardiovascular;  Laterality: N/A;   VISCERAL ANGIOGRAPHY N/A 10/04/2023   Procedure: VISCERAL ANGIOGRAPHY;  Surgeon: Jama Cordella MATSU, MD;  Location: [facility] INVASIVE CV LAB;  Service: Cardiovascular;  Laterality: N/A;   VISCERAL ANGIOGRAPHY N/A 11/25/2023   Procedure: VISCERAL ANGIOGRAPHY;  Surgeon: Jama Cordella MATSU, MD;  Location: [facility] INVASIVE CV LAB;  Service: Cardiovascular;  Laterality: N/A;   VISCERAL ANGIOGRAPHY N/A 07/06/2024   Procedure: VISCERAL ANGIOGRAPHY;  Surgeon: Jama Cordella MATSU, MD;  Location: [facility] INVASIVE CV LAB;  Service: Cardiovascular;  Laterality: N/A;    Social History:  reports that she has quit smoking. Her smoking use included cigarettes. She started smoking about 55 years ago. She has a 0.5 pack-year smoking history. Her smokeless tobacco use includes snuff. She reports that she does not drink alcohol and does not use drugs.  Family History:  Family History  Problem Relation Age of Onset   Hypertension Mother    Stroke Mother    Epilepsy Mother    Colon polyps Sister    Breast cancer Other      Prior to Admission medications  Medication Sig Start Date End Date Taking? Authorizing Provider  acetaminophen  (TYLENOL ) 650 MG CR tablet Take 650 mg by mouth every 8 (eight) hours as needed for pain.    [provider]  aspirin  81  MG chewable tablet Chew by mouth daily.    [provider]  atorvastatin  (LIPITOR) 10 MG tablet Take 10 mg by mouth daily with supper.    [provider]  cetirizine (ZYRTEC) 10 MG tablet Take 10 mg by mouth daily.    [provider]  clopidogrel  (PLAVIX ) 75 MG tablet Take 1 tablet (75 mg total) by mouth daily. 07/12/24   Argentina Clap, MD  colchicine  0.6 MG tablet Take 0.6 mg by mouth as needed. 10/14/21   [provider]  esomeprazole  (NEXIUM ) 40 MG capsule Take 40 mg by mouth 2 (two) times daily. Patient not taking: Reported on 07/30/2024    [provider]  famotidine  (PEPCID ) 40 MG tablet Take 40 mg by mouth at bedtime. 06/21/24   [provider]  ferrous sulfate  325 (65 FE) MG tablet Take 325 mg by mouth daily with breakfast.    [provider]  fluticasone  (FLONASE ) 50 MCG/ACT nasal spray Place 1 spray into both nostrils daily.    [provider]  hydrALAZINE  (APRESOLINE ) 50 MG tablet Take 50 mg by mouth 2 (two) times daily.    [provider]  lisinopril -hydrochlorothiazide  (ZESTORETIC ) 10-12.5 MG tablet Take 1 tablet by mouth daily. 10/13/21   [provider]  metoprolol  succinate (TOPROL -XL) 25 MG 24 hr  tablet Take 25 mg by mouth daily. 10/04/22   [provider]  Multiple Vitamins-Minerals (CENTRUM ADULTS PO) Take by mouth daily.    [provider]  oxyCODONE -acetaminophen  (PERCOCET/ROXICET) 5-325 MG tablet Take 1 tablet by mouth every 4 (four) hours as needed for severe pain (pain score 7-10). 07/27/24   Brown, Fallon E, NP  PARoxetine  (PAXIL ) 10 MG tablet Take 1 tablet by mouth daily at 6 (six) AM. 11/06/21   [provider]  sucralfate  (CARAFATE ) 1 g tablet Take 1 tablet (1 g total) by mouth 4 (four) times daily as needed. 07/07/24   Jens Durand, MD    Physical Exam: Vitals:   10/06/24 1723 10/06/24 1900 10/06/24 1944 10/06/24 2042  BP: (!) 169/155 (!) 173/64 (!)  163/135   Pulse: 84 63 78   Resp: (!) 21  16   Temp: 98.4 F (36.9 C)  98.2 F (36.8 C)   TempSrc: Oral  Oral   SpO2: 100% 99% 100%   Weight:    103.5 kg  Height:    5' 9 (1.753 m)   General: Not in acute distress HEENT:       Eyes: PERRL, EOMI, no jaundice       ENT: No discharge from the ears and nose, no pharynx injection, no tonsillar enlargement.        Neck: No JVD, no bruit, no mass felt. Heme: No neck lymph node enlargement. Cardiac: S1/S2, RRR, No murmurs, No gallops or rubs. Respiratory: No rales, wheezing, rhonchi or rubs. GI: Soft, nondistended, has tenderness to epigastric area, no rebound pain, no organomegaly, BS present. GU: No hematuria Ext: No pitting leg edema bilaterally. 1+DP/PT pulse bilaterally. Musculoskeletal: No joint deformities, No joint redness or warmth, no limitation of ROM in spin. Skin: No rashes.  Neuro: Alert, oriented X3, cranial nerves II-XII grossly intact, moves all extremities normally.  Psych: Patient is not psychotic, no suicidal or hemocidal ideation.  Labs on Admission: I have personally reviewed following labs and imaging studies  CBC: Recent Labs  Lab 10/06/24 1155  WBC 6.9  HGB 10.3*  HCT 33.0*  MCV 79.1*  PLT 325   Basic Metabolic Panel: Recent Labs  Lab 10/06/24 1155  NA 139  K 3.7  CL 105  CO2 23  GLUCOSE 94  BUN 10  CREATININE 0.85  CALCIUM  9.3   GFR: Estimated Creatinine Clearance: 77.7 mL/min (by C-G formula based on SCr of 0.85 mg/dL). Liver Function Tests: Recent Labs  Lab 10/06/24 1843  AST 81*  ALT 36  ALKPHOS 70  BILITOT 0.4  PROT 6.8  ALBUMIN 4.2   Recent Labs  Lab 10/06/24 1848  LIPASE 52*   No results for input(s): AMMONIA in the last 168 hours. Coagulation Profile: Recent Labs  Lab 10/06/24 1155  INR 0.9   Cardiac Enzymes: No results for input(s): CKTOTAL, CKMB, CKMBINDEX, TROPONINI in the last 168 hours. BNP (last 3 results) Recent Labs    10/06/24 1843  PROBNP  162.0   HbA1C: No results for input(s): HGBA1C in the last 72 hours. CBG: No results for input(s): GLUCAP in the last 168 hours. Lipid Profile: No results for input(s): CHOL, HDL, LDLCALC, TRIG, CHOLHDL, LDLDIRECT in the last 72 hours. Thyroid Function Tests: No results for input(s): TSH, T4TOTAL, FREET4, T3FREE, THYROIDAB in the last 72 hours. Anemia Panel: No results for input(s): VITAMINB12, FOLATE, FERRITIN, TIBC, IRON, RETICCTPCT in the last 72 hours. Urine analysis:    Component Value Date/Time   COLORURINE YELLOW (  A) 07/05/2024 1035   APPEARANCEUR CLEAR (A) 07/05/2024 1035   APPEARANCEUR Clear 05/08/2014 0316   LABSPEC 1.012 07/05/2024 1035   LABSPEC 1.010 05/08/2014 0316   PHURINE 7.0 07/05/2024 1035   GLUCOSEU NEGATIVE 07/05/2024 1035   GLUCOSEU Negative 05/08/2014 0316   HGBUR NEGATIVE 07/05/2024 1035   BILIRUBINUR NEGATIVE 07/05/2024 1035   BILIRUBINUR Negative 05/08/2014 0316   KETONESUR NEGATIVE 07/05/2024 1035   PROTEINUR NEGATIVE 07/05/2024 1035   NITRITE NEGATIVE 07/05/2024 1035   LEUKOCYTESUR NEGATIVE 07/05/2024 1035   LEUKOCYTESUR Negative 05/08/2014 0316   Sepsis Labs: @LABRCNTIP (procalcitonin:4,lacticidven:4) )No results found for this or any previous visit (from the past 240 hours).   Radiological Exams on Admission:   Assessment/Plan Principal Problem:   Celiac artery stenosis Active Problems:   Abdominal pain   HTN (hypertension)   HLD (hyperlipidemia)   Chronic diastolic CHF (congestive heart failure) (HCC)   Iron deficiency anemia   Depression   OSA on CPAP   Obesity (BMI 30-39.9)   Assessment and Plan:  Abdominal pain due to celiac artery stenosis: Lactic acid of 1.1.  per EDP, due to anaphylaxis reaction to contrast, CTA not performed.  Mesenteric artery ultrasound showed hemodynamically significant (>70%) celiac axis stenosis. Consulted Dr. Tisa of [facility].   -Place in telemetry bed for  observation. - IV heparin  started in ED - As needed Percocet for pain - Continue aspirin  and Plavix   HTN (hypertension) -IV hydralazine  as needed - Metoprolol , Zestoretic , oral hydralazine   HLD (hyperlipidemia) -Lipitor  Chronic diastolic CHF (congestive heart failure) (HCC): 2D echo on 08/27/2024 showed EF of 60-85% with grade 1 diastolic dysfunction.  No leg edema or JVD.  CHF is compensated. -Check BNP  Iron deficiency anemia: Hemoglobin 10.3 (11.7 on 07/06/2024), no active bleeding. -Continue iron supplement  Depression -Paxil   OSA - on CPAP  Obesity (BMI 30-39.9): Patient has Obesity Class I, with body weight 103.5 Kg and BMI 33.7 kg/m2.  - Encourage losing weight - Exercise and healthy diet      DVT ppx: on IV Heparin      Code Status: Full code   Family Communication:     not done, no family member is at bed side.        Disposition Plan:  Anticipate discharge back to previous environment  Consults called:  Dr. Tisa of [facility]  Admission status and Level of care: Telemetry:    for obs as inpt        Dispo: The patient is from: Home              Anticipated d/c is to: Home              Anticipated d/c date is: 1 day              Patient currently is not medically stable to d/c.    Severity of Illness:  The appropriate patient status for this patient is OBSERVATION. Observation status is judged to be reasonable and necessary in order to provide the required intensity of service to ensure the patient's safety. The patient's presenting symptoms, physical exam findings, and initial radiographic and laboratory data in the context of their medical condition is felt to place them at decreased risk for further clinical deterioration. Furthermore, it is anticipated that the patient will be medically stable for discharge from the hospital within 2 midnights of admission.        Date of Service 10/07/2024    Caleb Exon [facility]   If 7PM-7AM,  please  contact night-coverage [email or web address] 10/07/2024, 2:25 AM     [1]  Allergies Allergen Reactions   Iodinated Contrast Media Anaphylaxis    Other reaction(s): Vomiting   Ciprofloxacin Diarrhea and Nausea Only    Nausea, vomiting  Other reaction(s): Vomiting   Codeine Sulfate [Codeine] Diarrhea    Nausea, vomiting   Meloxicam    Penicillins Nausea Only   Protonix  [Pantoprazole  Sodium] Hives   Shellfish Allergy Swelling   Sulfa Antibiotics Rash   "

## 2024-10-07 ENCOUNTER — Other Ambulatory Visit: Payer: Self-pay

## 2024-10-07 DIAGNOSIS — I774 Celiac artery compression syndrome: Secondary | ICD-10-CM | POA: Diagnosis not present

## 2024-10-07 DIAGNOSIS — Z95828 Presence of other vascular implants and grafts: Secondary | ICD-10-CM | POA: Diagnosis not present

## 2024-10-07 DIAGNOSIS — Z9889 Other specified postprocedural states: Secondary | ICD-10-CM | POA: Diagnosis not present

## 2024-10-07 DIAGNOSIS — Z7982 Long term (current) use of aspirin: Secondary | ICD-10-CM | POA: Diagnosis not present

## 2024-10-07 DIAGNOSIS — I708 Atherosclerosis of other arteries: Secondary | ICD-10-CM | POA: Diagnosis not present

## 2024-10-07 LAB — CBC
HCT: 30.7 % — ABNORMAL LOW (ref 36.0–46.0)
Hemoglobin: 9.7 g/dL — ABNORMAL LOW (ref 12.0–15.0)
MCH: 24.7 pg — ABNORMAL LOW (ref 26.0–34.0)
MCHC: 31.6 g/dL (ref 30.0–36.0)
MCV: 78.1 fL — ABNORMAL LOW (ref 80.0–100.0)
Platelets: 262 K/uL (ref 150–400)
RBC: 3.93 MIL/uL (ref 3.87–5.11)
RDW: 14.8 % (ref 11.5–15.5)
WBC: 5.4 K/uL (ref 4.0–10.5)
nRBC: 0 % (ref 0.0–0.2)

## 2024-10-07 LAB — BASIC METABOLIC PANEL WITH GFR
Anion gap: 8 (ref 5–15)
BUN: 8 mg/dL (ref 8–23)
CO2: 25 mmol/L (ref 22–32)
Calcium: 8.7 mg/dL — ABNORMAL LOW (ref 8.9–10.3)
Chloride: 109 mmol/L (ref 98–111)
Creatinine, Ser: 0.73 mg/dL (ref 0.44–1.00)
GFR, Estimated: >60 mL/min
Glucose, Bld: 98 mg/dL (ref 70–99)
Potassium: 3.3 mmol/L — ABNORMAL LOW (ref 3.5–5.1)
Sodium: 142 mmol/L (ref 135–145)

## 2024-10-07 LAB — HEPARIN LEVEL (UNFRACTIONATED): Heparin Unfractionated: 0.44 [IU]/mL (ref 0.30–0.70)

## 2024-10-07 MED ORDER — OXYCODONE-ACETAMINOPHEN 5-325 MG PO TABS
1.0000 | ORAL_TABLET | ORAL | 0 refills | Status: AC | PRN
  Filled 2024-10-07: qty 20, 4d supply, fill #0

## 2024-10-07 MED ORDER — POTASSIUM CHLORIDE CRYS ER 20 MEQ PO TBCR
40.0000 meq | EXTENDED_RELEASE_TABLET | Freq: Once | ORAL | Status: AC
  Administered 2024-10-07: 40 meq via ORAL
  Filled 2024-10-07: qty 2

## 2024-10-07 NOTE — Plan of Care (Signed)

## 2024-10-07 NOTE — Consult Note (Signed)
 PHARMACY - ANTICOAGULATION CONSULT NOTE  Pharmacy Consult for Heparin  Indication: Hemodynamically significant (>70%) celiac axis stenosis   Allergies[1]  Patient Measurements: Height: 5' 9 (175.3 cm) Weight: 103.5 kg (228 lb 2.8 oz) IBW/kg (Calculated) : 66.2 HEPARIN  DW (KG): 89  Vital Signs: Temp: 98.2 F (36.8 C) (01/10 1944) Temp Source: Oral (01/10 1944) BP: 163/135 (01/10 1944) Pulse Rate: 78 (01/10 1944)  Labs: Recent Labs    10/06/24 1155 10/06/24 1843 10/07/24 0307  HGB 10.3*  --  9.7*  HCT 33.0*  --  30.7*  PLT 325  --  262  APTT  --  <22*  --   LABPROT 12.7  --   --   INR 0.9  --   --   HEPARINUNFRC  --   --  0.44  CREATININE 0.85  --   --     Estimated Creatinine Clearance: 77.7 mL/min (by C-G formula based on SCr of 0.85 mg/dL).   Medical History: Past Medical History:  Diagnosis Date   Adenomatous colon polyp    Adrenal adenoma    Allergic genetic state    Arthritis    Chronic abdominal pain    GERD (gastroesophageal reflux disease)    Hiatal hernia    Hypertension    Sleep apnea     Medications:  Medications Prior to Admission  Medication Sig Dispense Refill Last Dose/Taking   acetaminophen  (TYLENOL ) 650 MG CR tablet Take 650 mg by mouth every 8 (eight) hours as needed for pain.      aspirin  81 MG chewable tablet Chew by mouth daily.      atorvastatin  (LIPITOR) 10 MG tablet Take 10 mg by mouth daily with supper.      cetirizine (ZYRTEC) 10 MG tablet Take 10 mg by mouth daily.      clopidogrel  (PLAVIX ) 75 MG tablet Take 1 tablet (75 mg total) by mouth daily. 90 tablet 3    colchicine  0.6 MG tablet Take 0.6 mg by mouth as needed.      esomeprazole  (NEXIUM ) 40 MG capsule Take 40 mg by mouth 2 (two) times daily. (Patient not taking: Reported on 07/30/2024)      famotidine  (PEPCID ) 40 MG tablet Take 40 mg by mouth at bedtime.      ferrous sulfate  325 (65 FE) MG tablet Take 325 mg by mouth daily with breakfast.      fluticasone  (FLONASE ) 50  MCG/ACT nasal spray Place 1 spray into both nostrils daily.      hydrALAZINE  (APRESOLINE ) 50 MG tablet Take 50 mg by mouth 2 (two) times daily.      lisinopril -hydrochlorothiazide  (ZESTORETIC ) 10-12.5 MG tablet Take 1 tablet by mouth daily.      metoprolol  succinate (TOPROL -XL) 25 MG 24 hr tablet Take 25 mg by mouth daily.      Multiple Vitamins-Minerals (CENTRUM ADULTS PO) Take by mouth daily.      oxyCODONE -acetaminophen  (PERCOCET/ROXICET) 5-325 MG tablet Take 1 tablet by mouth every 4 (four) hours as needed for severe pain (pain score 7-10). 10 tablet 0    PARoxetine  (PAXIL ) 10 MG tablet Take 1 tablet by mouth daily at 6 (six) AM.      sucralfate  (CARAFATE ) 1 g tablet Take 1 tablet (1 g total) by mouth 4 (four) times daily as needed.      Scheduled:   aspirin   81 mg Oral Daily   atorvastatin   10 mg Oral Q supper   clopidogrel   75 mg Oral Daily   famotidine   40 mg  Oral QHS   ferrous sulfate   325 mg Oral Q breakfast   hydrALAZINE   50 mg Oral BID   lisinopril   10 mg Oral Daily   And   hydrochlorothiazide   12.5 mg Oral Daily   metoprolol  succinate  25 mg Oral Daily   PARoxetine   10 mg Oral Q0600   Infusions:   sodium chloride  100 mL/hr at 10/06/24 1904   heparin  1,200 Units/hr (10/06/24 1903)   PRN:  Anti-infectives (From admission, onward)    None       Assessment: Pt is a 48 YOF who presents to ED with epigastric pain that has continued to worsen over the past month. Pt had a stent placed in October and has had intermittent pain since. Prior to admission pt was not taking any anticoagulants but was on antiplatelet therapy taking clopidogrel  75 mg daily.  Current H& H are trending down with hemoglobin 10.3 and HCT 33, platelets are WNL. Pharmacy has been consulted for the management of heparin  in the setting of celiac axis stenosis for this pt. No DOAC PTA.   Goal of Therapy:  Heparin  level 0.3-0.7 units/ml Monitor platelets by anticoagulation protocol: Yes  01/11 0307 HL  0.44, therapeutic x 1   Plan:  Continue heparin  infusion at 1200 units/hr Recheck HL in 8 hours to confirm Continue to monitor H&H and platelets  Rankin CANDIE Dills, PharmD, [facility] 10/07/2024 3:43 AM        [1]  Allergies Allergen Reactions   Iodinated Contrast Media Anaphylaxis    Other reaction(s): Vomiting   Ciprofloxacin Diarrhea and Nausea Only    Nausea, vomiting  Other reaction(s): Vomiting   Codeine Sulfate [Codeine] Diarrhea    Nausea, vomiting   Meloxicam    Penicillins Nausea Only   Protonix  [Pantoprazole  Sodium] Hives   Shellfish Allergy Swelling   Sulfa Antibiotics Rash

## 2024-10-07 NOTE — Progress Notes (Signed)
 "    Subjective/Chief Complaint: Feeling much better this morning states her pain has mostly resolved. Only mild soreness. Denies nausea. Concerned about right flank soreness.  Objective: Vital signs in last 24 hours: Temp:  [97.7 F (36.5 C)-98.5 F (36.9 C)] 98.5 F (36.9 C) (01/11 0737) Pulse Rate:  [58-84] 72 (01/11 0737) Resp:  [16-21] 16 (01/11 0737) BP: (154-192)/(56-155) 161/74 (01/11 0737) SpO2:  [98 %-100 %] 99 % (01/11 0737) Weight:  [102.1 kg-103.5 kg] 103.5 kg (01/11 0408)    Intake/Output from previous day: 01/10 0701 - 01/11 0700 In: 1095.7 [I.V.:1095.7] Out: -  Intake/Output this shift: No intake/output data recorded.  General appearance: alert and no distress Cardio: regular rate and rhythm GI: normal findings: bowel sounds normal, no masses palpable, no organomegaly, soft, non-tender, and no rebound guarding, right flank non tender with palpation  Lab Results:  Recent Labs    10/06/24 1155 10/07/24 0307  WBC 6.9 5.4  HGB 10.3* 9.7*  HCT 33.0* 30.7*  PLT 325 262   BMET Recent Labs    10/06/24 1155 10/07/24 0307  NA 139 142  K 3.7 3.3*  CL 105 109  CO2 23 25  GLUCOSE 94 98  BUN 10 8  CREATININE 0.85 0.73  CALCIUM  9.3 8.7*   PT/INR Recent Labs    10/06/24 1155  LABPROT 12.7  INR 0.9   ABG No results for input(s): PHART, HCO3 in the last 72 hours.  Invalid input(s): PCO2, PO2  Studies/Results: US  MESENTERIC ARTERIES Result Date: 10/06/2024 EXAM: US  mesenteric doppler 10/06/2024 05:09:25 PM TECHNIQUE: Doppler ultrasound of the mesenteric vessels was performed utilizing grayscale, color Doppler and spectral Doppler techniques. 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 There is no evidence of abdominal aortic aneurysm  with a maximal diameter of 2.2 cm. Peak systolic velocities within the celiac axis proximally and distally with deep inspiration are 252 cm/sec and 192 cm/sec, respectively. Peak systolic velocities within the celiac axis proximally and distally on full expiration are 291 cm/sec and 255 cm/sec, respectively. There is a hemodynamically significant stenosis of the celiac axis greater than 70%. The absolute change in peak systolic velocity with inspiration and expiration does not meet criteria for definitive diagnosis of median arcuate ligament compression. The proximal superior mesenteric artery demonstrates a normal spectral Doppler waveform with a peak systolic velocity of 136 cm/sec. The inferior mesenteric artery was not visualized. OTHER MESENTERIC VESSELS: Splenic artery: 222 cm/sec Hepatic artery: 128 cm/sec IMPRESSION: 1. 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 If indicated, this could be better assessed with CT arteriography in the expiratory phase of respiration 2. No abdominal aortic aneurysm. Electronically signed by: Dorethia Molt MD MD 10/06/2024 05:34 PM EST RP Workstation: [id number]   CT ABDOMEN PELVIS WO CONTRAST Result Date: 10/06/2024 CLINICAL DATA:  Epigastric pain. EXAM: CT ABDOMEN AND PELVIS WITHOUT CONTRAST TECHNIQUE: Multidetector CT imaging of the abdomen and pelvis was performed following the standard protocol without IV contrast. RADIATION DOSE REDUCTION: This exam was performed according to the departmental dose-optimization program which includes automated exposure control, adjustment of the mA and/or kV according to patient size and/or use of iterative reconstruction technique. COMPARISON:  07/06/2024. FINDINGS: Lower chest: Mild bibasilar subsegmental volume loss. Heart is at the upper limits of normal  in size. Atherosclerotic calcification of the aorta  and right coronary artery. No pericardial or pleural effusion. Distal esophagus is grossly unremarkable. Hepatobiliary: Liver is grossly unremarkable. Cholecystectomy. No biliary ductal dilatation. Pancreas: Negative. Spleen: Negative. Adrenals/Urinary Tract: Slight nodular thickening of the right adrenal gland and 2.1 cm left adrenal nodule (23 Hounsfield units), unchanged from 08/25/2021. No specific follow-up necessary. Right kidney is unremarkable. Small low-attenuation lesion in the medial left kidney. No specific follow-up necessary. Ureters are decompressed. Bladder is grossly unremarkable. Stomach/Bowel: Stomach, small bowel, appendix and colon are unremarkable. Vascular/Lymphatic: Atherosclerotic calcification of the aorta. No pathologically enlarged lymph nodes. Reproductive: Hysterectomy.  No adnexal mass. Other: Small bilateral inguinal hernias contain fat. Trace pelvic free fluid. Musculoskeletal: Degenerative changes in the spine. IMPRESSION: 1. No findings to explain the patient's pain. 2. Bilateral adrenal adenomas. 3. Aortic atherosclerosis (ICD10-I70.0). Coronary artery calcification. Electronically Signed   By: Newell Eke M.D.   On: 10/06/2024 15:52   DG Chest 2 View Result Date: 10/06/2024 CLINICAL DATA:  Epigastric pain. EXAM: CHEST - 2 VIEW COMPARISON:  July 03, 2024 FINDINGS: The heart size and mediastinal contours are within normal limits. There is moderate to marked severity calcification of the aortic arch. Both lungs are clear. The visualized skeletal structures are unremarkable. IMPRESSION: No active cardiopulmonary disease. Electronically Signed   By: Suzen Dials M.D.   On: 10/06/2024 12:47    Anti-infectives: Anti-infectives (From admission, onward)    None       Assessment/Plan:  Celiac artery stenosis  Celiac stent patent- minimal change in velocities on current duplex 50-70%  OK to advance diet If remains pain free OK to discharge home-  continue  ASA/Plavix  Follow up in 1-2 weeks with Vascular Surgery    LOS: 0 days    Tisa Dakin A 10/07/2024  "

## 2024-10-07 NOTE — Discharge Summary (Signed)
 Physician Discharge Summary  Teresa Sparks FMW:[id number] DOB: May 04, 1951 DOA: 10/06/2024  PCP: Valora Lynwood FALCON, MD  Admit date: 10/06/2024 Discharge date: 10/07/2024  Admitted From: Home Disposition: Home  Recommendations for Outpatient Follow-up:  Follow up with PCP in 1-2 weeks Follow-up vascular 1 to 2 weeks  Home Health: No Equipment/Devices: None  Discharge Condition: Stable CODE STATUS: Full Diet recommendation: Regular  Brief/Interim Summary:  74 y.o. female with medical history significant of celiac artery stenosis, s/p of angioplasty stenting of Celiac Artery on 07/06/2024, HTN, HLD, dCHF, gout, GERD, OSA on CPAP, iron deficiency anemia, hiatal hernia, adrenal adenoma, depression, who presents with abdominal pain.   Patient states that she has intermittent mild abdominal pain chronically, but it has worsened since yesterday. It is located in her upper abdomen, constant, aching, 9 out of 10 in severity, nonradiating, not aggravated or alleviated by any known factors.  She has nausea, no vomiting, diarrhea or abdominal pain.  No fever or chills.  No chest pain, cough, SOB.  No symptoms of UTI.  No rectal bleeding or dark stool.  No recent fall or head injury.  Vascular consulted.  Patient was maintained on heparin  drip.  Celiac arterial vasculature evaluated by ultrasound.  Celiac stent remains widely patent.  Overall velocities not significantly changed from prior imaging 10/29.  Monitor overnight and was reevaluated by vascular surgery on the day of discharge.  Patient's pain is resolved and she stable for discharge home without intervention.  Heparin  GTT stopped.  Per patient's daughter at bedside patient has recurrence of such abdominal pain.  Daughter states that patient uses a chewable tobacco (dip).  Was asking if this is a potential contributing factor to the development of pain which normal certainly is.  Explained that nicotine is a potent vasoconstrictor and with  already known abdominal atherosclerosis this almost certainly is worsening the situation.  Immediate discontinuation of all nicotine containing products strongly advised.    Discharge Diagnoses:  Principal Problem:   Celiac artery stenosis Active Problems:   Abdominal pain   HTN (hypertension)   HLD (hyperlipidemia)   Chronic diastolic CHF (congestive heart failure) (HCC)   Iron deficiency anemia   Depression   OSA on CPAP   Obesity (BMI 30-39.9)  Intractable abdominal pain Patient with known celiac artery stenosis and use of bowel chewable tobacco products.  Nicotine being a potent vasoconstrictor is likely strongly contributing to patient's recurrence of her abdominal pain.  Immediate discontinuation of all nicotine containing products is advised.  Patient and daughter expressed understanding.  Patient tolerating p.o. intake without nausea vomiting or abdominal pain and is stable for discharge home.  Discharge Instructions  Discharge Instructions     Increase activity slowly   Complete by: As directed       Allergies as of 10/07/2024       Reactions   Iodinated Contrast Media Anaphylaxis   Other reaction(s): Vomiting   Ciprofloxacin Diarrhea, Nausea Only   Nausea, vomiting  Other reaction(s): Vomiting   Codeine Sulfate [codeine] Diarrhea   Nausea, vomiting   Meloxicam    Penicillins Nausea Only   Protonix  [pantoprazole  Sodium] Hives   Shellfish Allergy Swelling   Sulfa Antibiotics Rash        Medication List     STOP taking these medications    esomeprazole  40 MG capsule Commonly known as: NEXIUM        TAKE these medications    acetaminophen  650 MG CR tablet Commonly known as: TYLENOL  Take 650 mg  by mouth every 8 (eight) hours as needed for pain.   aspirin  81 MG chewable tablet Chew by mouth daily.   atorvastatin  10 MG tablet Commonly known as: LIPITOR Take 10 mg by mouth daily with supper.   CENTRUM ADULTS PO Take by mouth daily.    cetirizine 10 MG tablet Commonly known as: ZYRTEC Take 10 mg by mouth daily.   clopidogrel  75 MG tablet Commonly known as: PLAVIX  Take 1 tablet (75 mg total) by mouth daily.   colchicine  0.6 MG tablet Take 0.6 mg by mouth as needed.   famotidine  40 MG tablet Commonly known as: PEPCID  Take 40 mg by mouth at bedtime.   ferrous sulfate  325 (65 FE) MG tablet Take 325 mg by mouth daily with breakfast.   fluticasone  50 MCG/ACT nasal spray Commonly known as: FLONASE  Place 1 spray into both nostrils daily.   hydrALAZINE  50 MG tablet Commonly known as: APRESOLINE  Take 50 mg by mouth 2 (two) times daily.   lisinopril -hydrochlorothiazide  10-12.5 MG tablet Commonly known as: ZESTORETIC  Take 1 tablet by mouth daily.   metoprolol  succinate 25 MG 24 hr tablet Commonly known as: TOPROL -XL Take 25 mg by mouth daily.   oxyCODONE -acetaminophen  5-325 MG tablet Commonly known as: PERCOCET/ROXICET Take 1 tablet by mouth every 4 (four) hours as needed for severe pain (pain score 7-10).   PARoxetine  10 MG tablet Commonly known as: PAXIL  Take 1 tablet by mouth daily at 6 (six) AM.   sucralfate  1 g tablet Commonly known as: CARAFATE  Take 1 tablet (1 g total) by mouth 4 (four) times daily as needed.        Follow-up Information     Schnier, Cordella MATSU, MD. Schedule an appointment as soon as possible for a visit in 1 week(s).   Specialties: Vascular Surgery, Cardiology, Radiology, Vascular Surgery Why: Follow up in 1-2 weeks Contact information: [address] [place] [address] [phone]         Valora Lynwood FALCON, MD. Schedule an appointment as soon as possible for a visit in 1 week(s).   Specialty: Family Medicine Contact information: [address] [place] [address] [phone]                Allergies[1]  Consultations: Vascular surgery   Procedures/Studies: US  MESENTERIC ARTERIES Result Date:  10/06/2024 EXAM: US  mesenteric doppler 10/06/2024 05:09:25 PM TECHNIQUE: Doppler ultrasound of the mesenteric vessels was performed utilizing grayscale, color Doppler and spectral Doppler techniques. 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 There is no evidence of abdominal aortic aneurysm with a maximal diameter of 2.2 cm. Peak systolic velocities within the celiac axis proximally and distally with deep inspiration are 252 cm/sec and 192 cm/sec, respectively. Peak systolic velocities within the celiac axis proximally and distally on full expiration are 291 cm/sec and 255 cm/sec, respectively. There is a hemodynamically significant stenosis of the celiac axis greater than 70%. The absolute change in peak systolic velocity with inspiration and expiration does not meet criteria for definitive diagnosis of median arcuate ligament compression. The proximal superior mesenteric artery demonstrates a normal spectral Doppler waveform with a peak systolic velocity of 136 cm/sec. The inferior mesenteric artery was not visualized. OTHER MESENTERIC VESSELS: Splenic artery: 222 cm/sec Hepatic artery: 128 cm/sec IMPRESSION: 1. Hemodynamically significant (>70%) celiac axis stenosis;  inspiratory/expiratory velocity changes do not meet criteria for definitive median arcuate ligament compression. If indicated, this could be better assessed with CT arteriography in the expiratory phase of respiration 2. No abdominal aortic aneurysm. Electronically signed by: Dorethia Molt MD MD 10/06/2024 05:34 PM EST RP Workstation: [id number]   CT ABDOMEN PELVIS WO CONTRAST Result Date: 10/06/2024 CLINICAL DATA:  Epigastric pain. EXAM: CT ABDOMEN AND PELVIS WITHOUT CONTRAST TECHNIQUE: Multidetector CT imaging of the abdomen and  pelvis was performed following the standard protocol without IV contrast. RADIATION DOSE REDUCTION: This exam was performed according to the departmental dose-optimization program which includes automated exposure control, adjustment of the mA and/or kV according to patient size and/or use of iterative reconstruction technique. COMPARISON:  07/06/2024. FINDINGS: Lower chest: Mild bibasilar subsegmental volume loss. Heart is at the upper limits of normal in size. Atherosclerotic calcification of the aorta and right coronary artery. No pericardial or pleural effusion. Distal esophagus is grossly unremarkable. Hepatobiliary: Liver is grossly unremarkable. Cholecystectomy. No biliary ductal dilatation. Pancreas: Negative. Spleen: Negative. Adrenals/Urinary Tract: Slight nodular thickening of the right adrenal gland and 2.1 cm left adrenal nodule (23 Hounsfield units), unchanged from 08/25/2021. No specific follow-up necessary. Right kidney is unremarkable. Small low-attenuation lesion in the medial left kidney. No specific follow-up necessary. Ureters are decompressed. Bladder is grossly unremarkable. Stomach/Bowel: Stomach, small bowel, appendix and colon are unremarkable. Vascular/Lymphatic: Atherosclerotic calcification of the aorta. No pathologically enlarged lymph nodes. Reproductive: Hysterectomy.  No adnexal mass. Other: Small bilateral inguinal hernias contain fat. Trace pelvic free fluid. Musculoskeletal: Degenerative changes in the spine. IMPRESSION: 1. No findings to explain the patient's pain. 2. Bilateral adrenal adenomas. 3. Aortic atherosclerosis (ICD10-I70.0). Coronary artery calcification. Electronically Signed   By: Newell Eke M.D.   On: 10/06/2024 15:52   DG Chest 2 View Result Date: 10/06/2024 CLINICAL DATA:  Epigastric pain. EXAM: CHEST - 2 VIEW COMPARISON:  July 03, 2024 FINDINGS: The heart size and mediastinal contours are within normal limits. There is moderate to marked severity  calcification of the aortic arch. Both lungs are clear. The visualized skeletal structures are unremarkable. IMPRESSION: No active cardiopulmonary disease. Electronically Signed   By: Suzen Dials M.D.   On: 10/06/2024 12:47      Subjective: Seen and examined the day of discharge.  Stable no distress.  No abdominal pain nausea or vomiting.  Appropriate for discharge home.  Discharge Exam: Vitals:   10/07/24 0408 10/07/24 0737  BP: (!) 154/69 (!) 161/74  Pulse: 71 72  Resp: 17 16  Temp: 97.7 F (36.5 C) 98.5 F (36.9 C)  SpO2: 98% 99%   Vitals:   10/06/24 1944 10/06/24 2042 10/07/24 0408 10/07/24 0737  BP: (!) 163/135  (!) 154/69 (!) 161/74  Pulse: 78  71 72  Resp: 16  17 16   Temp: 98.2 F (36.8 C)  97.7 F (36.5 C) 98.5 F (36.9 C)  TempSrc: Oral  Oral Oral  SpO2: 100%  98% 99%  Weight:  103.5 kg 103.5 kg   Height:  5' 9 (1.753 m)      General: Pt is alert, awake, not in acute distress Cardiovascular: RRR, S1/S2 +, no rubs, no gallops Respiratory: CTA bilaterally, no wheezing, no rhonchi Abdominal: Soft, NT, ND, bowel sounds + Extremities: no edema, no cyanosis    The results of significant diagnostics from this hospitalization (including imaging, microbiology, ancillary and laboratory) are listed below for reference.     Microbiology: No results found for this or any previous visit (from  the past 240 hours).   Labs: BNP (last 3 results) No results for input(s): BNP in the last 8760 hours. Basic Metabolic Panel: Recent Labs  Lab 10/06/24 1155 10/07/24 0307  NA 139 142  K 3.7 3.3*  CL 105 109  CO2 23 25  GLUCOSE 94 98  BUN 10 8  CREATININE 0.85 0.73  CALCIUM  9.3 8.7*   Liver Function Tests: Recent Labs  Lab 10/06/24 1843  AST 81*  ALT 36  ALKPHOS 70  BILITOT 0.4  PROT 6.8  ALBUMIN 4.2   Recent Labs  Lab 10/06/24 1848  LIPASE 52*   No results for input(s): AMMONIA in the last 168 hours. CBC: Recent Labs  Lab 10/06/24 1155  10/07/24 0307  WBC 6.9 5.4  HGB 10.3* 9.7*  HCT 33.0* 30.7*  MCV 79.1* 78.1*  PLT 325 262   Cardiac Enzymes: No results for input(s): CKTOTAL, CKMB, CKMBINDEX, TROPONINI in the last 168 hours. BNP: Invalid input(s): POCBNP CBG: No results for input(s): GLUCAP in the last 168 hours. D-Dimer No results for input(s): DDIMER in the last 72 hours. Hgb A1c No results for input(s): HGBA1C in the last 72 hours. Lipid Profile No results for input(s): CHOL, HDL, LDLCALC, TRIG, CHOLHDL, LDLDIRECT in the last 72 hours. Thyroid function studies No results for input(s): TSH, T4TOTAL, T3FREE, THYROIDAB in the last 72 hours.  Invalid input(s): FREET3 Anemia work up No results for input(s): VITAMINB12, FOLATE, FERRITIN, TIBC, IRON, RETICCTPCT in the last 72 hours. Urinalysis    Component Value Date/Time   COLORURINE YELLOW (A) 07/05/2024 1035   APPEARANCEUR CLEAR (A) 07/05/2024 1035   APPEARANCEUR Clear 05/08/2014 0316   LABSPEC 1.012 07/05/2024 1035   LABSPEC 1.010 05/08/2014 0316   PHURINE 7.0 07/05/2024 1035   GLUCOSEU NEGATIVE 07/05/2024 1035   GLUCOSEU Negative 05/08/2014 0316   HGBUR NEGATIVE 07/05/2024 1035   BILIRUBINUR NEGATIVE 07/05/2024 1035   BILIRUBINUR Negative 05/08/2014 0316   KETONESUR NEGATIVE 07/05/2024 1035   PROTEINUR NEGATIVE 07/05/2024 1035   NITRITE NEGATIVE 07/05/2024 1035   LEUKOCYTESUR NEGATIVE 07/05/2024 1035   LEUKOCYTESUR Negative 05/08/2014 0316   Sepsis Labs Recent Labs  Lab 10/06/24 1155 10/07/24 0307  WBC 6.9 5.4   Microbiology No results found for this or any previous visit (from the past 240 hours).   Time coordinating discharge: 40 minutes  SIGNED:   Calvin KATHEE Robson, MD  [facility] 10/07/2024, 12:09 PM Pager   If 7PM-7AM, please contact night-coverage     [1]  Allergies Allergen Reactions   Iodinated Contrast Media Anaphylaxis    Other reaction(s): Vomiting    Ciprofloxacin Diarrhea and Nausea Only    Nausea, vomiting  Other reaction(s): Vomiting   Codeine Sulfate [Codeine] Diarrhea    Nausea, vomiting   Meloxicam    Penicillins Nausea Only   Protonix  [Pantoprazole  Sodium] Hives   Shellfish Allergy Swelling   Sulfa Antibiotics Rash

## 2024-10-07 NOTE — Progress Notes (Signed)
 Discharge instructions given to patient, questions answered. IV removed without complications. Patient transporting home via car by self. Meds to bed delivered to patient.

## 2024-10-22 ENCOUNTER — Ambulatory Visit (INDEPENDENT_AMBULATORY_CARE_PROVIDER_SITE_OTHER): Admitting: Vascular Surgery

## 2024-10-29 ENCOUNTER — Ambulatory Visit (INDEPENDENT_AMBULATORY_CARE_PROVIDER_SITE_OTHER): Admitting: Vascular Surgery

## 2024-10-29 NOTE — Progress Notes (Unsigned)
 "                                                                      MRN : [id number]  Teresa Sparks is a 74 y.o. (August 10, 1951) female who presents with chief complaint of check circulation.  History of Present Illness:   The patient returns to the office for follow-up regarding chronic mesenteric ischemia.  The patient is status post angiography with intervention of the celiac artery.  Procedure date 07/06/2024:    Percutaneous transluminal angioplasty and stent placement of the celiac artery   The patient reports that since the intervention the abdominal pain and postprandial symptoms are much better.  The patient denies further weight loss and the previous chronic nausea is improved.  The patient does not substantiate food fear.  The patient denies bloody bowel movements or diarrhea.    No history of peptic ulcer disease.   No prior peripheral angiograms or vascular interventions.  The patient denies amaurosis fugax or recent TIA symptoms. There are no recent neurological changes noted. The patient denies claudication symptoms or rest pain symptoms. The patient denies history of DVT, PE or superficial thrombophlebitis. The patient denies recent episodes of angina   Active Medications[1]  Past Medical History:  Diagnosis Date   Adenomatous colon polyp    Adrenal adenoma    Allergic genetic state    Arthritis    Chronic abdominal pain    GERD (gastroesophageal reflux disease)    Hiatal hernia    Hypertension    Sleep apnea     Past Surgical History:  Procedure Laterality Date   ABDOMINAL HYSTERECTOMY     CHOLECYSTECTOMY     COLONOSCOPY     COLONOSCOPY WITH PROPOFOL  N/A 07/24/2018   Procedure: COLONOSCOPY WITH PROPOFOL ;  Surgeon: Viktoria Lamar DASEN, MD;  Location: [facility] ENDOSCOPY;  Service: Endoscopy;  Laterality: N/A;   COLONOSCOPY WITH PROPOFOL  N/A 11/23/2023   Procedure: COLONOSCOPY WITH PROPOFOL ;  Surgeon: Toledo, Ladell POUR, MD;  Location: [facility] ENDOSCOPY;   Service: Gastroenterology;  Laterality: N/A;   ENDOBRONCHIAL ULTRASOUND Bilateral 09/30/2023   Procedure: ENDOBRONCHIAL ULTRASOUND;  Surgeon: Tamea Dedra CROME, MD;  Location: [facility] ORS;  Service: Pulmonary;  Laterality: Bilateral;   ESOPHAGOGASTRODUODENOSCOPY (EGD) WITH PROPOFOL  N/A 11/23/2023   Procedure: ESOPHAGOGASTRODUODENOSCOPY (EGD) WITH PROPOFOL ;  Surgeon: Toledo, Ladell POUR, MD;  Location: [facility] ENDOSCOPY;  Service: Gastroenterology;  Laterality: N/A;   FRACTURE SURGERY     HEMOSTASIS CLIP PLACEMENT  11/23/2023   Procedure: HEMOSTASIS CLIP PLACEMENT;  Surgeon: Aundria, Teodoro K, MD;  Location: [facility] ENDOSCOPY;  Service: Gastroenterology;;   HOT HEMOSTASIS  11/23/2023   Procedure: HOT HEMOSTASIS (ARGON PLASMA COAGULATION/BICAP);  Surgeon: Aundria, Ladell POUR, MD;  Location: [facility] ENDOSCOPY;  Service: Gastroenterology;;   JOINT REPLACEMENT Right    6 years ago   POLYPECTOMY  11/23/2023   Procedure: POLYPECTOMY;  Surgeon: Aundria, Ladell POUR, MD;  Location: [facility] ENDOSCOPY;  Service: Gastroenterology;;   VISCERAL ANGIOGRAPHY N/A 02/05/2022   Procedure: VISCERAL ANGIOGRAPHY;  Surgeon: Jama Cordella MATSU, MD;  Location: [facility] INVASIVE CV LAB;  Service: Cardiovascular;  Laterality: N/A;   VISCERAL ANGIOGRAPHY N/A 10/04/2023   Procedure: VISCERAL ANGIOGRAPHY;  Surgeon: Jama Cordella MATSU, MD;  Location: [facility] INVASIVE CV LAB;  Service: Cardiovascular;  Laterality: N/A;   VISCERAL ANGIOGRAPHY N/A 11/25/2023   Procedure: VISCERAL ANGIOGRAPHY;  Surgeon: Jama Cordella MATSU, MD;  Location: [facility] INVASIVE CV LAB;  Service: Cardiovascular;  Laterality: N/A;   VISCERAL ANGIOGRAPHY N/A 07/06/2024   Procedure: VISCERAL ANGIOGRAPHY;  Surgeon: Jama Cordella MATSU, MD;  Location: [facility] INVASIVE CV LAB;  Service: Cardiovascular;  Laterality: N/A;    Social History Social History[2]  Family History Family History  Problem Relation Age of Onset   Hypertension Mother    Stroke Mother    Epilepsy Mother    Colon polyps  Sister    Breast cancer Other     Allergies[3]   REVIEW OF SYSTEMS (Negative unless checked)  Constitutional: [] Weight loss  [] Fever  [] Chills Cardiac: [] Chest pain   [] Chest pressure   [] Palpitations   [] Shortness of breath when laying flat   [] Shortness of breath with exertion. Vascular:  [x] Pain in legs with walking   [] Pain in legs at rest  [] History of DVT   [] Phlebitis   [] Swelling in legs   [] Varicose veins   [] Non-healing ulcers Pulmonary:   [] Uses home oxygen   [] Productive cough   [] Hemoptysis   [] Wheeze  [] COPD   [] Asthma Neurologic:  [] Dizziness   [] Seizures   [] History of stroke   [] History of TIA  [] Aphasia   [] Vissual changes   [] Weakness or numbness in arm   [] Weakness or numbness in leg Musculoskeletal:   [] Joint swelling   [] Joint pain   [] Low back pain Hematologic:  [] Easy bruising  [] Easy bleeding   [] Hypercoagulable state   [] Anemic Gastrointestinal:  [] Diarrhea   [] Vomiting  [] Gastroesophageal reflux/heartburn   [] Difficulty swallowing. Genitourinary:  [] Chronic kidney disease   [] Difficult urination  [] Frequent urination   [] Blood in urine Skin:  [] Rashes   [] Ulcers  Psychological:  [] History of anxiety   []  History of major depression.  Physical Examination  There were no vitals filed for this visit. There is no height or weight on file to calculate BMI. Gen: WD/WN, NAD Head: [place]/AT, No temporalis wasting.  Ear/Nose/Throat: Hearing grossly intact, nares w/o erythema or drainage Eyes: PER, EOMI, sclera nonicteric.  Neck: Supple, no masses.  No bruit or JVD.  Pulmonary:  Good air movement, no audible wheezing, no use of accessory muscles.  Cardiac: RRR, normal S1, S2, no Murmurs. Vascular:  mild trophic changes, no open wounds Vessel Right Left  Radial Palpable Palpable  PT Not Palpable Not Palpable  DP Not Palpable Not Palpable  Gastrointestinal: soft, non-distended. No guarding/no peritoneal signs.  Musculoskeletal: M/S 5/5 throughout.  No visible  deformity.  Neurologic: CN 2-12 intact. Pain and light touch intact in extremities.  Symmetrical.  Speech is fluent. Motor exam as listed above. Psychiatric: Judgment intact, Mood & affect appropriate for pt's clinical situation. Dermatologic: No rashes or ulcers noted.  No changes consistent with cellulitis.   CBC Lab Results  Component Value Date   WBC 5.4 10/07/2024   HGB 9.7 (L) 10/07/2024   HCT 30.7 (L) 10/07/2024   MCV 78.1 (L) 10/07/2024   PLT 262 10/07/2024    BMET    Component Value Date/Time   NA 142 10/07/2024 0307   NA 138 05/08/2014 0151   K 3.3 (L) 10/07/2024 0307   K 3.8 05/08/2014 0151   CL 109 10/07/2024 0307   CL 106 05/08/2014 0151   CO2 25 10/07/2024 0307   CO2 23 05/08/2014 0151   GLUCOSE 98 10/07/2024 0307   GLUCOSE 97 05/08/2014 0151   BUN 8  10/07/2024 0307   BUN 16 05/08/2014 0151   CREATININE 0.73 10/07/2024 0307   CREATININE 1.25 05/08/2014 0151   CALCIUM  8.7 (L) 10/07/2024 0307   CALCIUM  8.8 05/08/2014 0151   GFRNONAA >60 10/07/2024 0307   GFRNONAA 46 (L) 05/08/2014 0151   GFRAA 56 (L) 04/23/2018 0901   GFRAA 54 (L) 05/08/2014 0151   CrCl cannot be calculated (Patient's most recent lab result is older than the maximum 21 days allowed.).  COAG Lab Results  Component Value Date   INR 0.9 10/06/2024   INR 1.1 07/06/2024   INR 1.0 07/05/2024    Radiology US  MESENTERIC ARTERIES Result Date: 10/06/2024 EXAM: US  mesenteric doppler 10/06/2024 05:09:25 PM TECHNIQUE: Doppler ultrasound of the mesenteric vessels was performed utilizing grayscale, color Doppler and spectral Doppler techniques. 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 There is no evidence of abdominal aortic aneurysm with a maximal diameter of 2.2 cm. Peak systolic  velocities within the celiac axis proximally and distally with deep inspiration are 252 cm/sec and 192 cm/sec, respectively. Peak systolic velocities within the celiac axis proximally and distally on full expiration are 291 cm/sec and 255 cm/sec, respectively. There is a hemodynamically significant stenosis of the celiac axis greater than 70%. The absolute change in peak systolic velocity with inspiration and expiration does not meet criteria for definitive diagnosis of median arcuate ligament compression. The proximal superior mesenteric artery demonstrates a normal spectral Doppler waveform with a peak systolic velocity of 136 cm/sec. The inferior mesenteric artery was not visualized. OTHER MESENTERIC VESSELS: Splenic artery: 222 cm/sec Hepatic artery: 128 cm/sec IMPRESSION: 1. 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 If indicated, this could be better assessed with CT arteriography in the expiratory phase of respiration 2. No abdominal aortic aneurysm. Electronically signed by: Dorethia Molt MD MD 10/06/2024 05:34 PM EST RP Workstation: [id number]   CT ABDOMEN PELVIS WO CONTRAST Result Date: 10/06/2024 CLINICAL DATA:  Epigastric pain. EXAM: CT ABDOMEN AND PELVIS WITHOUT CONTRAST TECHNIQUE: Multidetector CT imaging of the abdomen and pelvis was performed following the standard protocol without IV contrast. RADIATION DOSE REDUCTION: This exam was performed according to the departmental dose-optimization program which includes automated exposure control, adjustment of the mA and/or kV according to patient size and/or use of iterative reconstruction technique. COMPARISON:  07/06/2024. FINDINGS: Lower chest: Mild bibasilar subsegmental volume loss. Heart is at the upper limits of normal in size. Atherosclerotic calcification of the aorta and right coronary artery. No pericardial or  pleural effusion. Distal esophagus is grossly unremarkable. Hepatobiliary: Liver is grossly unremarkable. Cholecystectomy. No biliary ductal dilatation. Pancreas: Negative. Spleen: Negative. Adrenals/Urinary Tract: Slight nodular thickening of the right adrenal gland and 2.1 cm left adrenal nodule (23 Hounsfield units), unchanged from 08/25/2021. No specific follow-up necessary. Right kidney is unremarkable. Small low-attenuation lesion in the medial left kidney. No specific follow-up necessary. Ureters are decompressed. Bladder is grossly unremarkable. Stomach/Bowel: Stomach, small bowel, appendix and colon are unremarkable. Vascular/Lymphatic: Atherosclerotic calcification of the aorta. No pathologically enlarged lymph nodes. Reproductive: Hysterectomy.  No adnexal mass. Other: Small bilateral inguinal hernias contain fat. Trace pelvic free fluid. Musculoskeletal: Degenerative changes in the spine. IMPRESSION: 1. No findings to explain the patient's pain. 2. Bilateral adrenal adenomas. 3. Aortic atherosclerosis (ICD10-I70.0). Coronary artery calcification. Electronically Signed  By: Newell Eke M.D.   On: 10/06/2024 15:52   DG Chest 2 View Result Date: 10/06/2024 CLINICAL DATA:  Epigastric pain. EXAM: CHEST - 2 VIEW COMPARISON:  July 03, 2024 FINDINGS: The heart size and mediastinal contours are within normal limits. There is moderate to marked severity calcification of the aortic arch. Both lungs are clear. The visualized skeletal structures are unremarkable. IMPRESSION: No active cardiopulmonary disease. Electronically Signed   By: Suzen Dials M.D.   On: 10/06/2024 12:47     Assessment/Plan There are no diagnoses linked to this encounter.   Cordella Shawl, MD  10/29/2024 1:08 PM      [1]  No outpatient medications have been marked as taking for the 10/29/24 encounter (Appointment) with Shawl, Cordella MATSU, MD.  [2]  Social History Tobacco Use   Smoking status: Former    Average  packs/day: 0.3 packs/day for 2.0 years (0.5 ttl pk-yrs)    Types: Cigarettes    Start date: 1971   Smokeless tobacco: Current    Types: Snuff  Vaping Use   Vaping status: Never Used  Substance Use Topics   Alcohol use: No   Drug use: Never  [3]  Allergies Allergen Reactions   Iodinated Contrast Media Anaphylaxis    Other reaction(s): Vomiting   Ciprofloxacin Diarrhea and Nausea Only    Nausea, vomiting  Other reaction(s): Vomiting   Codeine Sulfate [Codeine] Diarrhea    Nausea, vomiting   Meloxicam    Penicillins Nausea Only   Protonix  [Pantoprazole  Sodium] Hives   Shellfish Allergy Swelling   Sulfa Antibiotics Rash   "

## 2024-11-08 ENCOUNTER — Ambulatory Visit (INDEPENDENT_AMBULATORY_CARE_PROVIDER_SITE_OTHER): Admitting: Vascular Surgery
